# Patient Record
Sex: Female | Born: 1940
Health system: Southern US, Community
[De-identification: ages and names within clinical notes are randomized; demographics above are authoritative.]

## PROBLEM LIST (undated history)

## (undated) DIAGNOSIS — I10 Essential (primary) hypertension: Secondary | ICD-10-CM

## (undated) DIAGNOSIS — Z4689 Encounter for fitting and adjustment of other specified devices: Secondary | ICD-10-CM

## (undated) DIAGNOSIS — C801 Malignant (primary) neoplasm, unspecified: Secondary | ICD-10-CM

## (undated) DIAGNOSIS — N816 Rectocele: Secondary | ICD-10-CM

## (undated) DIAGNOSIS — Z5189 Encounter for other specified aftercare: Secondary | ICD-10-CM

## (undated) DIAGNOSIS — E785 Hyperlipidemia, unspecified: Secondary | ICD-10-CM

## (undated) DIAGNOSIS — M1990 Unspecified osteoarthritis, unspecified site: Secondary | ICD-10-CM

## (undated) DIAGNOSIS — E039 Hypothyroidism, unspecified: Secondary | ICD-10-CM

## (undated) DIAGNOSIS — E669 Obesity, unspecified: Secondary | ICD-10-CM

## (undated) DIAGNOSIS — N951 Menopausal and female climacteric states: Secondary | ICD-10-CM

## (undated) DIAGNOSIS — N8110 Cystocele, unspecified: Secondary | ICD-10-CM

## (undated) HISTORY — PX: ABDOMINAL HYSTERECTOMY: SHX81

## (undated) HISTORY — DX: Encounter for other specified aftercare: Z51.89

## (undated) HISTORY — PX: COLON SURGERY: SHX602

## (undated) HISTORY — DX: Malignant (primary) neoplasm, unspecified: C80.1

## (undated) HISTORY — DX: Encounter for fitting and adjustment of other specified devices: Z46.89

## (undated) HISTORY — PX: BREAST SURGERY: SHX581

## (undated) HISTORY — PX: COLONOSCOPY: SHX174

## (undated) HISTORY — PX: TUBAL LIGATION: SHX77

## (undated) HISTORY — PX: OTHER SURGICAL HISTORY: SHX169

---

## 1970-07-23 HISTORY — PX: BREAST EXCISIONAL BIOPSY: SUR124

## 2004-06-05 ENCOUNTER — Ambulatory Visit: Payer: Self-pay | Admitting: Unknown Physician Specialty

## 2004-08-29 ENCOUNTER — Ambulatory Visit: Payer: Self-pay | Admitting: Obstetrics and Gynecology

## 2005-09-30 ENCOUNTER — Emergency Department (HOSPITAL_COMMUNITY): Admission: EM | Admit: 2005-09-30 | Discharge: 2005-09-30 | Payer: Self-pay | Admitting: Emergency Medicine

## 2005-12-28 ENCOUNTER — Ambulatory Visit: Payer: Self-pay

## 2007-01-29 ENCOUNTER — Ambulatory Visit: Payer: Self-pay | Admitting: Certified Nurse Midwife

## 2007-04-05 ENCOUNTER — Observation Stay: Payer: Self-pay | Admitting: Internal Medicine

## 2008-08-05 ENCOUNTER — Ambulatory Visit: Payer: Self-pay

## 2009-01-27 ENCOUNTER — Ambulatory Visit: Payer: Self-pay | Admitting: Family Medicine

## 2009-08-23 ENCOUNTER — Ambulatory Visit: Payer: Self-pay | Admitting: Family Medicine

## 2009-09-29 ENCOUNTER — Ambulatory Visit: Payer: Self-pay

## 2010-10-11 ENCOUNTER — Ambulatory Visit: Payer: Self-pay

## 2011-11-08 ENCOUNTER — Ambulatory Visit: Payer: Self-pay

## 2012-01-10 ENCOUNTER — Ambulatory Visit: Payer: Self-pay | Admitting: Unknown Physician Specialty

## 2012-01-10 LAB — HM COLONOSCOPY

## 2012-01-11 LAB — PATHOLOGY REPORT

## 2012-11-10 ENCOUNTER — Ambulatory Visit: Payer: Self-pay

## 2013-04-02 ENCOUNTER — Ambulatory Visit: Payer: Self-pay | Admitting: Family Medicine

## 2013-10-20 DIAGNOSIS — I10 Essential (primary) hypertension: Secondary | ICD-10-CM | POA: Insufficient documentation

## 2013-10-20 DIAGNOSIS — N814 Uterovaginal prolapse, unspecified: Secondary | ICD-10-CM | POA: Insufficient documentation

## 2013-10-20 DIAGNOSIS — N816 Rectocele: Secondary | ICD-10-CM | POA: Insufficient documentation

## 2013-10-20 DIAGNOSIS — E7800 Pure hypercholesterolemia, unspecified: Secondary | ICD-10-CM | POA: Insufficient documentation

## 2013-10-20 DIAGNOSIS — E669 Obesity, unspecified: Secondary | ICD-10-CM | POA: Insufficient documentation

## 2013-10-20 DIAGNOSIS — N951 Menopausal and female climacteric states: Secondary | ICD-10-CM | POA: Insufficient documentation

## 2013-10-20 DIAGNOSIS — M1990 Unspecified osteoarthritis, unspecified site: Secondary | ICD-10-CM | POA: Insufficient documentation

## 2013-11-11 ENCOUNTER — Ambulatory Visit: Payer: Self-pay

## 2014-08-03 ENCOUNTER — Encounter: Payer: Self-pay | Admitting: Internal Medicine

## 2014-08-08 NOTE — Progress Notes (Signed)
This encounter was created in error - please disregard.

## 2014-09-10 DIAGNOSIS — M7742 Metatarsalgia, left foot: Secondary | ICD-10-CM | POA: Diagnosis not present

## 2014-09-10 DIAGNOSIS — M216X9 Other acquired deformities of unspecified foot: Secondary | ICD-10-CM | POA: Diagnosis not present

## 2014-09-10 DIAGNOSIS — Q829 Congenital malformation of skin, unspecified: Secondary | ICD-10-CM | POA: Diagnosis not present

## 2014-09-10 DIAGNOSIS — M79672 Pain in left foot: Secondary | ICD-10-CM | POA: Diagnosis not present

## 2014-10-18 DIAGNOSIS — L237 Allergic contact dermatitis due to plants, except food: Secondary | ICD-10-CM | POA: Diagnosis not present

## 2015-01-28 DIAGNOSIS — B372 Candidiasis of skin and nail: Secondary | ICD-10-CM | POA: Insufficient documentation

## 2015-01-28 DIAGNOSIS — E039 Hypothyroidism, unspecified: Secondary | ICD-10-CM | POA: Insufficient documentation

## 2015-01-28 DIAGNOSIS — L255 Unspecified contact dermatitis due to plants, except food: Secondary | ICD-10-CM | POA: Insufficient documentation

## 2015-01-28 DIAGNOSIS — E7800 Pure hypercholesterolemia, unspecified: Secondary | ICD-10-CM

## 2015-01-28 DIAGNOSIS — M545 Low back pain, unspecified: Secondary | ICD-10-CM | POA: Insufficient documentation

## 2015-01-28 DIAGNOSIS — L0390 Cellulitis, unspecified: Secondary | ICD-10-CM | POA: Insufficient documentation

## 2015-01-28 DIAGNOSIS — I10 Essential (primary) hypertension: Secondary | ICD-10-CM | POA: Insufficient documentation

## 2015-02-02 DIAGNOSIS — E785 Hyperlipidemia, unspecified: Secondary | ICD-10-CM | POA: Insufficient documentation

## 2015-02-02 DIAGNOSIS — O039 Complete or unspecified spontaneous abortion without complication: Secondary | ICD-10-CM | POA: Insufficient documentation

## 2015-02-02 DIAGNOSIS — L03119 Cellulitis of unspecified part of limb: Secondary | ICD-10-CM | POA: Insufficient documentation

## 2015-02-04 ENCOUNTER — Ambulatory Visit (INDEPENDENT_AMBULATORY_CARE_PROVIDER_SITE_OTHER): Payer: Medicare Other | Admitting: Family Medicine

## 2015-02-04 ENCOUNTER — Encounter: Payer: Self-pay | Admitting: Family Medicine

## 2015-02-04 VITALS — BP 136/94 | HR 63 | Temp 97.4°F | Resp 16 | Wt 186.4 lb

## 2015-02-04 DIAGNOSIS — E785 Hyperlipidemia, unspecified: Secondary | ICD-10-CM

## 2015-02-04 DIAGNOSIS — M1990 Unspecified osteoarthritis, unspecified site: Secondary | ICD-10-CM | POA: Diagnosis not present

## 2015-02-04 DIAGNOSIS — I10 Essential (primary) hypertension: Secondary | ICD-10-CM

## 2015-02-04 NOTE — Progress Notes (Signed)
Subjective:    Patient ID: Stacey Fitzgerald, female    DOB: 1941-07-04, 74 y.o.   MRN: [id number] Chief Complaint  Patient presents with  . Follow-up    HPI  This 74 year old female presents for follow up of hypertension, hypothyroidism, hypercholesterolemia and arthritis. Borderline BP today but feels it is due to recent stress. Tolerating Accupril without side effects. Trying to follow low fat diet and continues Pravastatin 40 mg qd without myalgias. Arthritis in lumbar spine stable and only has intermittent ache with stiffness. Finds Tylenol Arthritis works to control ache/pain. No radiation of pain or numbness. 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 Good compliance with Levothyroxine 50 mcg qd. Denies palpitations, chest pain, tremor, heat or cold intolerance.  History reviewed. No pertinent past medical history. Patient Active Problem List   Diagnosis Date Noted  . Abortion, spontaneous 02/02/2015  . Bacterial skin infection of leg 02/02/2015  . HLD (hyperlipidemia) 02/02/2015  . Pure hypercholesterolemia 01/28/2015  . Essential hypertension 01/28/2015  . Cellulitis 01/28/2015  . Hypothyroidism 01/28/2015  . Monilial intertrigo 01/28/2015  . Rhus dermatitis 01/28/2015  . Low back pain, episodic 01/28/2015  . Hernia, rectovaginal 10/20/2013  . Adiposity 10/20/2013  . Climacteric 10/20/2013  . BP (high blood pressure) 10/20/2013  . Hypercholesteremia 10/20/2013  . Bladder cystocele 10/20/2013  . Arthritis 10/20/2013   Past Surgical History  Procedure Laterality Date  . Tubal ligation    . Breast biopsy Left    History  Substance Use Topics  . Smoking status: Never Smoker   . Smokeless tobacco: Not on file  . Alcohol Use: No   Family History  Problem Relation Age of Onset  . Diabetes Mother   . Seizures Mother   . CVA Father   . Dementia Brother   . Diabetes Maternal Grandfather   . Mental illness Sister    Current Outpatient Prescriptions on File Prior to  Visit  Medication Sig Dispense Refill  . levothyroxine (SYNTHROID, LEVOTHROID) 50 MCG tablet Take 50 mcg by mouth daily.    . Multiple Vitamins-Minerals (CENTRUM SILVER ADULT 50+) TABS Take 1 tablet by mouth daily.    . niacin 250 MG tablet Take 250 mg by mouth at bedtime.    . pravastatin (PRAVACHOL) 40 MG tablet Take 40 mg by mouth at bedtime.    . quinapril (ACCUPRIL) 10 MG tablet Take 10 mg by mouth daily.     No current facility-administered medications on file prior to visit.   Allergies  Allergen Reactions  . Keflex [Cephalexin] Hives   Review of Systems  Constitutional: Negative.   HENT: Negative.   Eyes: Negative.   Respiratory: Negative.   Cardiovascular: Negative.   Gastrointestinal: Negative.   Musculoskeletal: Positive for back pain.       Controlled by Arthritis Tylenol.      BP 136/94 mmHg  Pulse 63  Temp(Src) 97.4 F (36.3 C) (Oral)  Resp 16  Wt 186 lb 6.4 oz (84.55 kg)  SpO2 97%  Objective:   Physical Exam  Constitutional: She appears well-developed and well-nourished.  HENT:  Head: Normocephalic and atraumatic.  Right Ear: External ear normal.  Left Ear: External ear normal.  Nose: Nose normal.  Mouth/Throat: Oropharynx is clear and moist.  Eyes: Conjunctivae and EOM are normal. Pupils are equal, round, and reactive to light.  Neck: Normal range of motion. Neck supple.  Cardiovascular: Normal rate, regular rhythm, normal heart sounds and intact distal pulses.   Many large clusters  of superficial varicose veins of both legs.  Pulmonary/Chest: Effort normal.  Abdominal: Bowel sounds are normal.  Musculoskeletal: She exhibits no edema or tenderness.  Lower back ache - suspect arthritis. No pain radiation.  Skin: Skin is warm and dry.  Psychiatric: She has a normal mood and affect. Her behavior is normal. Thought content normal.      Assessment & Plan:  1. Essential hypertension Slightly elevated today. Feels it is due to recent stress. Tolerating  Accupril 40 mg qd without side effects. Will check routine labs and follow up pending reports. - CBC with Differential/Platelet - Comprehensive metabolic panel - Lipid panel - TSH  2. Arthritis History of degenerative changes with scoliosis and Grade I anterolisthesis of L4 on L5 on lumbar spine x-ray in 2014. Feels Tylenol controls the ache/pain and has declined referral or MRI. - CBC with Differential/Platelet  3. HLD (hyperlipidemia) Tolerating Pravastatin 40 mg qd and trying to follow low fat diet. Continue medication and ordered follow up labs. Recommend annual wellness exam in the next 3-4 months. Recheck pending lab reports. - Comprehensive metabolic panel - Lipid panel - TSH

## 2015-02-12 LAB — LIPID PANEL
Chol/HDL Ratio: 4.7 ratio units — ABNORMAL HIGH (ref 0.0–4.4)
Cholesterol, Total: 188 mg/dL (ref 100–199)
HDL: 40 mg/dL (ref >39)
LDL CALC: 115 mg/dL — AB (ref 0–99)
Triglycerides: 163 mg/dL — ABNORMAL HIGH (ref 0–149)
VLDL Cholesterol Cal: 33 mg/dL (ref 5–40)

## 2015-02-12 LAB — CBC WITH DIFFERENTIAL/PLATELET
Basophils Absolute: 0.1 10*3/uL (ref 0.0–0.2)
Basos: 1 %
EOS (ABSOLUTE): 0.3 10*3/uL (ref 0.0–0.4)
EOS: 3 %
Hematocrit: 41.4 % (ref 34.0–46.6)
Hemoglobin: 13.6 g/dL (ref 11.1–15.9)
IMMATURE GRANULOCYTES: 0 %
Immature Grans (Abs): 0 10*3/uL (ref 0.0–0.1)
Lymphocytes Absolute: 1.8 10*3/uL (ref 0.7–3.1)
Lymphs: 23 %
MCH: 27.8 pg (ref 26.6–33.0)
MCHC: 32.9 g/dL (ref 31.5–35.7)
MCV: 85 fL (ref 79–97)
MONOCYTES: 7 %
Monocytes Absolute: 0.5 10*3/uL (ref 0.1–0.9)
Neutrophils Absolute: 5.2 10*3/uL (ref 1.4–7.0)
Neutrophils: 66 %
PLATELETS: 287 10*3/uL (ref 150–379)
RBC: 4.89 x10E6/uL (ref 3.77–5.28)
RDW: 13.8 % (ref 12.3–15.4)
WBC: 7.9 10*3/uL (ref 3.4–10.8)

## 2015-02-12 LAB — TSH: TSH: 2.1 u[IU]/mL (ref 0.450–4.500)

## 2015-02-12 LAB — COMPREHENSIVE METABOLIC PANEL
A/G RATIO: 1.4 (ref 1.1–2.5)
ALT: 12 IU/L (ref 0–32)
AST: 6 IU/L (ref 0–40)
Albumin: 4 g/dL (ref 3.5–4.8)
Alkaline Phosphatase: 70 IU/L (ref 39–117)
BUN/Creatinine Ratio: 22 (ref 11–26)
BUN: 18 mg/dL (ref 8–27)
Bilirubin Total: 0.6 mg/dL (ref 0.0–1.2)
CALCIUM: 9.4 mg/dL (ref 8.7–10.3)
CO2: 24 mmol/L (ref 18–29)
Chloride: 102 mmol/L (ref 97–108)
Creatinine, Ser: 0.82 mg/dL (ref 0.57–1.00)
GFR calc Af Amer: 82 mL/min/{1.73_m2} (ref >59)
GFR calc non Af Amer: 71 mL/min/{1.73_m2} (ref >59)
GLUCOSE: 100 mg/dL — AB (ref 65–99)
Globulin, Total: 2.8 g/dL (ref 1.5–4.5)
POTASSIUM: 4.4 mmol/L (ref 3.5–5.2)
Sodium: 142 mmol/L (ref 134–144)
Total Protein: 6.8 g/dL (ref 6.0–8.5)

## 2015-02-15 ENCOUNTER — Telehealth: Payer: Self-pay

## 2015-02-15 NOTE — Telephone Encounter (Signed)
-----   Message from [place], [place] sent at 02/14/2015  2:02 AM EDT ----- Blood tests normal except Triglycerides elevated (better than December 2015 test). Continue pravastatin and recheck in 6 months.

## 2015-02-15 NOTE — Telephone Encounter (Signed)
Patient advised as directed below. Patient came by the office.

## 2015-02-15 NOTE — Telephone Encounter (Signed)
LMTCB

## 2015-05-16 ENCOUNTER — Telehealth: Payer: Self-pay | Admitting: Family Medicine

## 2015-05-16 NOTE — Telephone Encounter (Signed)
Per Simona Huh pt will need an appt. Patient notified. Patient stated that she will continue to drink cranberry juice and water. Patient said that if not better she will come in for ov.

## 2015-05-16 NOTE — Telephone Encounter (Signed)
Pt stated that she has been having a little bit of burning sensation when voiding, she stated that she has been drinking cranberry juice and water and wants to know if she can only bring a urine sample and if you can send her a prescription if she needs it. She stated that she cannot come in for an Office Visit until Friday. Her phone # [phone]  Please advise,

## 2015-05-16 NOTE — Telephone Encounter (Signed)
Left message to call back.  Can she bring a urine sample if she has symptoms, or does she needs an Office Visit?  Please advise,

## 2015-05-16 NOTE — Telephone Encounter (Signed)
Pt wants to know if she can drop off a urine sample tomorrow am.  She thinks she may have a UTI.    Her call back is [phone].  If she does answer please leave a message.   ThanksTeri

## 2015-05-19 DIAGNOSIS — N390 Urinary tract infection, site not specified: Secondary | ICD-10-CM | POA: Diagnosis not present

## 2015-05-19 DIAGNOSIS — R300 Dysuria: Secondary | ICD-10-CM | POA: Diagnosis not present

## 2015-06-07 ENCOUNTER — Other Ambulatory Visit: Payer: Self-pay | Admitting: Family Medicine

## 2015-06-20 ENCOUNTER — Telehealth: Payer: Self-pay | Admitting: Family Medicine

## 2015-07-01 ENCOUNTER — Encounter: Payer: Medicare Other | Admitting: Family Medicine

## 2015-07-28 ENCOUNTER — Ambulatory Visit (INDEPENDENT_AMBULATORY_CARE_PROVIDER_SITE_OTHER): Payer: Medicare Other | Admitting: Family Medicine

## 2015-07-28 ENCOUNTER — Encounter: Payer: Self-pay | Admitting: Family Medicine

## 2015-07-28 VITALS — BP 180/102 | HR 74 | Temp 97.9°F | Resp 14 | Wt 186.4 lb

## 2015-07-28 DIAGNOSIS — E039 Hypothyroidism, unspecified: Secondary | ICD-10-CM | POA: Diagnosis not present

## 2015-07-28 DIAGNOSIS — E7800 Pure hypercholesterolemia, unspecified: Secondary | ICD-10-CM | POA: Diagnosis not present

## 2015-07-28 DIAGNOSIS — I10 Essential (primary) hypertension: Secondary | ICD-10-CM | POA: Diagnosis not present

## 2015-07-28 MED ORDER — LEVOTHYROXINE SODIUM 50 MCG PO TABS
50.0000 ug | ORAL_TABLET | Freq: Every day | ORAL | Status: DC
Start: 2015-07-28 — End: 2016-08-05

## 2015-07-28 MED ORDER — PRAVASTATIN SODIUM 40 MG PO TABS: 40.0000 mg | ORAL_TABLET | Freq: Every day | ORAL | Status: DC

## 2015-07-28 MED ORDER — QUINAPRIL HCL 10 MG PO TABS: 10.0000 mg | ORAL_TABLET | Freq: Every day | ORAL | Status: DC

## 2015-07-28 NOTE — Progress Notes (Signed)
Patient ID: Stacey Fitzgerald, female   DOB: [birth year], 75 y.o.   MRN: [id number]   Patient: Stacey Fitzgerald Female    DOB: May 22, 1941   75 y.o.   MRN: [id number] Visit Date: 07/28/2015  Today's Provider: Vernie Murders, PA   Chief Complaint  Patient presents with  . Hyperlipidemia  . Hypertension  . Hypothyroidism  . Medication Refill   Subjective:    Hyperlipidemia This is a chronic problem. The problem is controlled.  Hypertension This is a chronic problem.   Patient Active Problem List   Diagnosis Date Noted  . Abortion, spontaneous 02/02/2015  . Bacterial skin infection of leg 02/02/2015  . HLD (hyperlipidemia) 02/02/2015  . Pure hypercholesterolemia 01/28/2015  . Essential hypertension 01/28/2015  . Cellulitis 01/28/2015  . Hypothyroidism 01/28/2015  . Monilial intertrigo 01/28/2015  . Rhus dermatitis 01/28/2015  . Low back pain, episodic 01/28/2015  . Hernia, rectovaginal 10/20/2013  . Adiposity 10/20/2013  . Climacteric 10/20/2013  . BP (high blood pressure) 10/20/2013  . Hypercholesteremia 10/20/2013  . Bladder cystocele 10/20/2013  . Arthritis 10/20/2013   Past Surgical History  Procedure Laterality Date  . Tubal ligation    . Breast biopsy Left    Family History  Problem Relation Age of Onset  . Diabetes Mother   . Seizures Mother   . CVA Father   . Dementia Brother   . Diabetes Maternal Grandfather   . Mental illness Sister    Allergies  Allergen Reactions  . Keflex [Cephalexin] Hives     Previous Medications   LEVOTHYROXINE (SYNTHROID, LEVOTHROID) 50 MCG TABLET    Take 50 mcg by mouth daily.   MULTIPLE VITAMINS-MINERALS (CENTRUM SILVER ADULT 50+) TABS    Take 1 tablet by mouth daily.   NIACIN 250 MG TABLET    Take 250 mg by mouth at bedtime.   PRAVASTATIN (PRAVACHOL) 40 MG TABLET    TAKE 1 TABLET AT BEDTIME   QUINAPRIL (ACCUPRIL) 10 MG TABLET    Take 10 mg by mouth daily.    Review of Systems  Constitutional: Negative.   HENT:  Negative.   Eyes: Negative.   Respiratory: Negative.   Cardiovascular: Negative.   Gastrointestinal: Negative.   Endocrine: Negative.   Genitourinary: Negative.   Musculoskeletal: Negative.   Skin: Negative.   Allergic/Immunologic: Negative.   Neurological: Negative.   Hematological: Negative.   Psychiatric/Behavioral: Negative.     Social History  Substance Use Topics  . Smoking status: Never Smoker   . Smokeless tobacco: Not on file  . Alcohol Use: No   Objective:   BP 180/102 mmHg  Pulse 74  Temp(Src) 97.9 F (36.6 C) (Oral)  Resp 14  Wt 186 lb 6.4 oz (84.55 kg)  Physical Exam  Constitutional: She is oriented to person, place, and time. She appears well-developed and well-nourished.  HENT:  Head: Normocephalic.  Eyes: Conjunctivae and EOM are normal.  Neck: Normal range of motion. Neck supple. No JVD present. No thyromegaly present.  Cardiovascular: Normal rate, regular rhythm, normal heart sounds and intact distal pulses.   Pulmonary/Chest: Effort normal and breath sounds normal.  Abdominal: Soft. Bowel sounds are normal.  Lymphadenopathy:    She has no cervical adenopathy.  Neurological: She is alert and oriented to person, place, and time.  Psychiatric: Her speech is normal. Judgment and thought content normal. Her mood appears anxious. She is agitated. Cognition and memory are normal. She expresses no suicidal ideation. She expresses no suicidal plans.  Anxious and crying  during interview while discussing husband's health and plans for hip replacement on 08-02-15.      Assessment & Plan:     1. Essential hypertension BP very high today. Finally admits to being off all her medications recently due to anxiety/worry about husband's health. Feels stress will subside once he has his surgery on 08-02-15. Will get follow up labs and refill medications. Recheck BP at home and call report in 2-3 weeks.  - quinapril (ACCUPRIL) 10 MG tablet; Take 1 tablet (10 mg total) by  mouth daily.  Dispense: 90 tablet; Refill: 3 - CBC with Differential/Platelet; Future  2. Hypothyroidism, unspecified hypothyroidism type Tolerating Levothyroxine without side effects. Refill and get follow up labs. - levothyroxine (SYNTHROID, LEVOTHROID) 50 MCG tablet; Take 1 tablet (50 mcg total) by mouth daily.  Dispense: 90 tablet; Refill: 3 - TSH; Future  3. Hypercholesteremia Poor compliance with medications recently due to worry about husband having hip replacement soon. Will refill medications and get follow up labs. Recheck in 3 months routinely. - pravastatin (PRAVACHOL) 40 MG tablet; Take 1 tablet (40 mg total) by mouth at bedtime.  Dispense: 90 tablet; Refill: 3 - COMPLETE METABOLIC PANEL WITH GFR; Future - Lipid panel; Future

## 2015-08-15 ENCOUNTER — Telehealth: Payer: Self-pay | Admitting: Family Medicine

## 2015-08-15 DIAGNOSIS — I10 Essential (primary) hypertension: Secondary | ICD-10-CM

## 2015-08-15 DIAGNOSIS — E039 Hypothyroidism, unspecified: Secondary | ICD-10-CM

## 2015-08-15 DIAGNOSIS — E7800 Pure hypercholesterolemia, unspecified: Secondary | ICD-10-CM

## 2015-08-15 NOTE — Telephone Encounter (Signed)
Pt stated she needs her labs slip from 07/28/15 put up front and she will come by this week to pick it up. Thanks TNP

## 2015-08-15 NOTE — Telephone Encounter (Signed)
Lab slip printed at front desk for pick up.

## 2015-08-18 ENCOUNTER — Other Ambulatory Visit: Payer: Self-pay | Admitting: Family Medicine

## 2015-08-18 DIAGNOSIS — I10 Essential (primary) hypertension: Secondary | ICD-10-CM | POA: Diagnosis not present

## 2015-08-18 DIAGNOSIS — E039 Hypothyroidism, unspecified: Secondary | ICD-10-CM | POA: Diagnosis not present

## 2015-08-18 DIAGNOSIS — E7800 Pure hypercholesterolemia, unspecified: Secondary | ICD-10-CM | POA: Diagnosis not present

## 2015-08-19 LAB — COMPREHENSIVE METABOLIC PANEL
ALT: 13 IU/L (ref 0–32)
AST: 13 IU/L (ref 0–40)
Albumin/Globulin Ratio: 1.8 (ref 1.1–2.5)
Albumin: 4.2 g/dL (ref 3.5–4.8)
Alkaline Phosphatase: 70 IU/L (ref 39–117)
BILIRUBIN TOTAL: 0.6 mg/dL (ref 0.0–1.2)
BUN/Creatinine Ratio: 17 (ref 11–26)
BUN: 13 mg/dL (ref 8–27)
CHLORIDE: 103 mmol/L (ref 96–106)
CO2: 24 mmol/L (ref 18–29)
Calcium: 9.9 mg/dL (ref 8.7–10.3)
Creatinine, Ser: 0.77 mg/dL (ref 0.57–1.00)
GFR calc non Af Amer: 77 mL/min/{1.73_m2} (ref >59)
GFR, EST AFRICAN AMERICAN: 89 mL/min/{1.73_m2} (ref >59)
GLUCOSE: 97 mg/dL (ref 65–99)
Globulin, Total: 2.4 g/dL (ref 1.5–4.5)
Potassium: 4.2 mmol/L (ref 3.5–5.2)
Sodium: 142 mmol/L (ref 134–144)
TOTAL PROTEIN: 6.6 g/dL (ref 6.0–8.5)

## 2015-08-19 LAB — CBC WITH DIFFERENTIAL/PLATELET
BASOS ABS: 0.1 10*3/uL (ref 0.0–0.2)
Basos: 1 %
EOS (ABSOLUTE): 0.4 10*3/uL (ref 0.0–0.4)
Eos: 4 %
HEMATOCRIT: 41.7 % (ref 34.0–46.6)
Hemoglobin: 14.1 g/dL (ref 11.1–15.9)
Immature Grans (Abs): 0 10*3/uL (ref 0.0–0.1)
Immature Granulocytes: 0 %
LYMPHS ABS: 2.1 10*3/uL (ref 0.7–3.1)
Lymphs: 24 %
MCH: 28 pg (ref 26.6–33.0)
MCHC: 33.8 g/dL (ref 31.5–35.7)
MCV: 83 fL (ref 79–97)
MONOS ABS: 0.5 10*3/uL (ref 0.1–0.9)
Monocytes: 6 %
NEUTROS PCT: 65 %
Neutrophils Absolute: 5.5 10*3/uL (ref 1.4–7.0)
PLATELETS: 281 10*3/uL (ref 150–379)
RBC: 5.04 x10E6/uL (ref 3.77–5.28)
RDW: 13.8 % (ref 12.3–15.4)
WBC: 8.5 10*3/uL (ref 3.4–10.8)

## 2015-08-19 LAB — LIPID PANEL
CHOL/HDL RATIO: 4.3 ratio (ref 0.0–4.4)
Cholesterol, Total: 187 mg/dL (ref 100–199)
HDL: 44 mg/dL (ref >39)
LDL Calculated: 108 mg/dL — ABNORMAL HIGH (ref 0–99)
Triglycerides: 173 mg/dL — ABNORMAL HIGH (ref 0–149)
VLDL CHOLESTEROL CAL: 35 mg/dL (ref 5–40)

## 2015-08-19 LAB — TSH: TSH: 2.84 u[IU]/mL (ref 0.450–4.500)

## 2015-08-22 ENCOUNTER — Telehealth: Payer: Self-pay

## 2015-08-22 NOTE — Telephone Encounter (Signed)
LMTCB

## 2015-08-22 NOTE — Telephone Encounter (Signed)
-----   Message from [place]o [place], [place] sent at 08/19/2015  6:13 PM EST ----- All blood tests normal except triglycerides slightly elevated. Cholesterol better. Continue Pravastatin and low fat diet. Recheck progress in 6 months.

## 2015-08-23 NOTE — Telephone Encounter (Signed)
Patient advised as directed below. Patient verbalized understanding and agrees with plan of care.  

## 2015-09-21 ENCOUNTER — Ambulatory Visit (INDEPENDENT_AMBULATORY_CARE_PROVIDER_SITE_OTHER): Payer: Medicare Other | Admitting: Family Medicine

## 2015-09-21 ENCOUNTER — Encounter: Payer: Self-pay | Admitting: Family Medicine

## 2015-09-21 VITALS — BP 130/78 | HR 60 | Temp 97.9°F | Resp 16 | Wt 184.6 lb

## 2015-09-21 DIAGNOSIS — J069 Acute upper respiratory infection, unspecified: Secondary | ICD-10-CM | POA: Diagnosis not present

## 2015-09-21 NOTE — Progress Notes (Signed)
Subjective:     Patient ID: Stacey Fitzgerald, female   DOB: 1941-04-10, 75 y.o.   MRN: [id number]  HPI  Chief Complaint  Patient presents with  . Sore Throat    Patient comes in office today with concerns of sore throat for the past 3 days, patient reports pain on the right side of her neck as well. Patient denies any other associated cold/flu like symptoms. Patient reports taking otc Coricidan Hbp and Ibuprofen.   Reports mild sinus congestion, PND and occasional cough. Has been using home alcohol remedies to suppress cough at night. States he husband was ill at first.   Review of Systems     Objective:   Physical Exam  Constitutional: She appears well-developed and well-nourished. No distress.  Ears: T.M's intact without inflammation Sinuses: non-tender Throat: no tonsillar enlargement or exudate Neck: Left tender anterior cervical node. Lungs: clear     Assessment:     1. Upper respiratory infection     Plan:    Discussed continued otc medications for symptomatic treatment.

## 2015-09-21 NOTE — Patient Instructions (Signed)
Continue Coricidin, may add salt water nasal spray for congestion, Delsym for cough as needed, and Mucinex or Robitussin expectorant.

## 2015-09-27 DIAGNOSIS — R0981 Nasal congestion: Secondary | ICD-10-CM | POA: Diagnosis not present

## 2015-09-27 DIAGNOSIS — J069 Acute upper respiratory infection, unspecified: Secondary | ICD-10-CM | POA: Diagnosis not present

## 2015-11-15 ENCOUNTER — Ambulatory Visit (INDEPENDENT_AMBULATORY_CARE_PROVIDER_SITE_OTHER): Payer: Medicare Other | Admitting: Family Medicine

## 2015-11-15 ENCOUNTER — Encounter: Payer: Self-pay | Admitting: Family Medicine

## 2015-11-15 ENCOUNTER — Telehealth: Payer: Self-pay | Admitting: Family Medicine

## 2015-11-15 VITALS — BP 160/98 | HR 57 | Temp 97.6°F | Resp 14 | Wt 182.2 lb

## 2015-11-15 DIAGNOSIS — R42 Dizziness and giddiness: Secondary | ICD-10-CM | POA: Diagnosis not present

## 2015-11-15 DIAGNOSIS — R7301 Impaired fasting glucose: Secondary | ICD-10-CM | POA: Diagnosis not present

## 2015-11-15 DIAGNOSIS — R110 Nausea: Secondary | ICD-10-CM

## 2015-11-15 MED ORDER — MECLIZINE HCL 32 MG PO TABS: 32.0000 mg | ORAL_TABLET | Freq: Three times a day (TID) | ORAL | Status: DC | PRN

## 2015-11-15 NOTE — Patient Instructions (Signed)

## 2015-11-15 NOTE — Progress Notes (Signed)
Patient ID: Stacey Fitzgerald, female   DOB: 1941-04-04, 75 y.o.   MRN: [id number]   Patient: Stacey Fitzgerald Female    DOB: March 27, 1941   75 y.o.   MRN: [id number] Visit Date: 11/15/2015  Today's Provider: Vernie Murders, PA   Chief Complaint  Patient presents with  . Dizziness  . Nausea   Subjective:    Dizziness This is a new problem. The current episode started today. The problem occurs constantly. Associated symptoms include nausea and vertigo. Pertinent negatives include no congestion, coughing, fever, sore throat or vomiting. Associated symptoms comments: Elevated blood pressure and slight left ear ache over the past couple days.. Nothing aggravates the symptoms. She has tried nothing for the symptoms.    Patient Active Problem List   Diagnosis Date Noted  . Abortion, spontaneous 02/02/2015  . Bacterial skin infection of leg 02/02/2015  . HLD (hyperlipidemia) 02/02/2015  . Pure hypercholesterolemia 01/28/2015  . Essential hypertension 01/28/2015  . Cellulitis 01/28/2015  . Hypothyroidism 01/28/2015  . Monilial intertrigo 01/28/2015  . Rhus dermatitis 01/28/2015  . Low back pain, episodic 01/28/2015  . Hernia, rectovaginal 10/20/2013  . Adiposity 10/20/2013  . Climacteric 10/20/2013  . BP (high blood pressure) 10/20/2013  . Hypercholesteremia 10/20/2013  . Bladder cystocele 10/20/2013  . Arthritis 10/20/2013   Past Surgical History  Procedure Laterality Date  . Tubal ligation    . Breast biopsy Left    Family History  Problem Relation Age of Onset  . Diabetes Mother   . Seizures Mother   . CVA Father   . Dementia Brother   . Diabetes Maternal Grandfather   . Mental illness Sister    Previous Medications   LEVOTHYROXINE (SYNTHROID, LEVOTHROID) 50 MCG TABLET    Take 1 tablet (50 mcg total) by mouth daily.   MULTIPLE VITAMINS-MINERALS (CENTRUM SILVER ADULT 50+) TABS    Take 1 tablet by mouth daily.   NIACIN 250 MG TABLET    Take 250 mg by mouth at bedtime.   PRAVASTATIN (PRAVACHOL) 40 MG TABLET    Take 1 tablet (40 mg total) by mouth at bedtime.   QUINAPRIL (ACCUPRIL) 10 MG TABLET    Take 1 tablet (10 mg total) by mouth daily.   Allergies  Allergen Reactions  . Keflex [Cephalexin] Hives   Review of Systems  Constitutional: Negative.  Negative for fever.  HENT: Negative.  Negative for congestion and sore throat.   Eyes: Negative.   Respiratory: Negative.  Negative for cough.   Cardiovascular: Negative.   Gastrointestinal: Positive for nausea. Negative for vomiting.  Endocrine: Negative.   Genitourinary: Negative.   Musculoskeletal: Negative.   Skin: Negative.   Allergic/Immunologic: Negative.   Neurological: Positive for dizziness and vertigo.  Hematological: Negative.   Psychiatric/Behavioral: Negative.     Social History  Substance Use Topics  . Smoking status: Never Smoker   . Smokeless tobacco: Not on file  . Alcohol Use: No   Objective:   BP 160/98 mmHg  Pulse 57  Temp(Src) 97.6 F (36.4 C) (Oral)  Resp 14  Wt 182 lb 3.2 oz (82.645 kg)  SpO2 97% BP Readings from Last 3 Encounters:  11/15/15 160/98  09/21/15 130/78  07/28/15 180/102    Physical Exam  Constitutional: She is oriented to person, place, and time. She appears well-developed and well-nourished.  HENT:  Head: Normocephalic.  Right Ear: External ear normal.  Canal occluded with wax.  Eyes: Conjunctivae and EOM are normal.  Neck: Normal range of motion. Neck  supple.  Cardiovascular: Normal rate and regular rhythm.   Pulmonary/Chest: Effort normal and breath sounds normal.  Abdominal: Soft. Bowel sounds are normal.  Lymphadenopathy:    She has no cervical adenopathy.  Neurological: She is alert and oriented to person, place, and time. She has normal reflexes. Coordination abnormal.  Slightly positive Romberg after bending over.      Assessment & Plan:     1. Dizziness Onset over the past 12 hours with a history of some left ear discomfort. Ear  canal occluded with wax but no redness to canal and unable to see TM. Slightly off balance when she stands quickly or bends over. Will treat with Meclizine and get labs. Increase fluids and rest at home. Recheck pending reports. - meclizine (ANTIVERT) 32 MG tablet; Take 1 tablet (32 mg total) by mouth 3 (three) times daily as needed.  Dispense: 30 tablet; Refill: 0 - CBC with Differential/Platelet - Comprehensive metabolic panel  2. Nausea No vomiting or diarrhea. Ate breakfast today without any troubles. Nausea associated with dizziness. Suspect viral labyrinthitis. Recheck pending lab reports. - meclizine (ANTIVERT) 32 MG tablet; Take 1 tablet (32 mg total) by mouth 3 (three) times daily as needed.  Dispense: 30 tablet; Refill: 0 - CBC with Differential/Platelet - Comprehensive metabolic panel

## 2015-11-15 NOTE — Telephone Encounter (Signed)
Pharmacy called needing verbal ok to change RX generic anitvert dosage.   Pt was there wanting to pick up Rx.  Generic Antivert.  They wanted to change from 32 mg's to 25 mg's stating it only comes in 25 mg tablets.  OK to change per Simona Huh.  Con Memos

## 2015-11-16 LAB — COMPREHENSIVE METABOLIC PANEL
A/G RATIO: 1.8 (ref 1.2–2.2)
ALBUMIN: 4.4 g/dL (ref 3.5–4.8)
ALK PHOS: 73 IU/L (ref 39–117)
ALT: 9 IU/L (ref 0–32)
AST: 5 IU/L (ref 0–40)
BILIRUBIN TOTAL: 0.4 mg/dL (ref 0.0–1.2)
BUN / CREAT RATIO: 17 (ref 12–28)
BUN: 16 mg/dL (ref 8–27)
CO2: 24 mmol/L (ref 18–29)
CREATININE: 0.92 mg/dL (ref 0.57–1.00)
Calcium: 10.2 mg/dL (ref 8.7–10.3)
Chloride: 100 mmol/L (ref 96–106)
GFR calc Af Amer: 71 mL/min/{1.73_m2} (ref >59)
GFR calc non Af Amer: 62 mL/min/{1.73_m2} (ref >59)
GLOBULIN, TOTAL: 2.5 g/dL (ref 1.5–4.5)
Glucose: 128 mg/dL — ABNORMAL HIGH (ref 65–99)
Potassium: 4.4 mmol/L (ref 3.5–5.2)
SODIUM: 140 mmol/L (ref 134–144)
Total Protein: 6.9 g/dL (ref 6.0–8.5)

## 2015-11-16 LAB — CBC WITH DIFFERENTIAL/PLATELET
Basophils Absolute: 0 10*3/uL (ref 0.0–0.2)
Basos: 0 %
EOS (ABSOLUTE): 0.2 10*3/uL (ref 0.0–0.4)
EOS: 2 %
HEMATOCRIT: 42.9 % (ref 34.0–46.6)
HEMOGLOBIN: 14.3 g/dL (ref 11.1–15.9)
Immature Grans (Abs): 0 10*3/uL (ref 0.0–0.1)
Immature Granulocytes: 0 %
LYMPHS ABS: 1.6 10*3/uL (ref 0.7–3.1)
Lymphs: 16 %
MCH: 27.7 pg (ref 26.6–33.0)
MCHC: 33.3 g/dL (ref 31.5–35.7)
MCV: 83 fL (ref 79–97)
MONOCYTES: 5 %
Monocytes Absolute: 0.6 10*3/uL (ref 0.1–0.9)
NEUTROS ABS: 7.7 10*3/uL — AB (ref 1.4–7.0)
Neutrophils: 77 %
Platelets: 287 10*3/uL (ref 150–379)
RBC: 5.17 x10E6/uL (ref 3.77–5.28)
RDW: 14.1 % (ref 12.3–15.4)
WBC: 10.1 10*3/uL (ref 3.4–10.8)

## 2015-11-17 ENCOUNTER — Telehealth: Payer: Self-pay

## 2015-11-17 NOTE — Telephone Encounter (Signed)
Pt is returning call.  [phone]

## 2015-11-17 NOTE — Telephone Encounter (Signed)
[facility] to add test, awaiting confirmation.

## 2015-11-17 NOTE — Telephone Encounter (Signed)
-----   Message from [place], [place] sent at 11/17/2015 11:44 AM EDT ----- All tests normal except blood sugar high. Recommend getting lab to run Hgb A1C to be sure no diabetes.

## 2015-11-18 ENCOUNTER — Telehealth: Payer: Self-pay

## 2015-11-18 NOTE — Telephone Encounter (Signed)
Advised patient as below. Advised that we will give her a call as soon as results come in.

## 2015-11-18 NOTE — Telephone Encounter (Signed)
Patient advised as directed below. 

## 2015-11-18 NOTE — Telephone Encounter (Signed)
-----   Message from [place], [place] sent at 11/18/2015 10:30 AM EDT ----- Normal Hgb A1C - not diabetic levels.

## 2015-11-19 LAB — SPECIMEN STATUS REPORT

## 2015-11-19 LAB — HGB A1C W/O EAG: Hgb A1c MFr Bld: 5.6 % (ref 4.8–5.6)

## 2015-12-01 ENCOUNTER — Other Ambulatory Visit: Payer: Self-pay

## 2015-12-01 ENCOUNTER — Encounter: Payer: Self-pay | Admitting: Family Medicine

## 2015-12-01 ENCOUNTER — Ambulatory Visit (INDEPENDENT_AMBULATORY_CARE_PROVIDER_SITE_OTHER): Payer: Medicare Other | Admitting: Family Medicine

## 2015-12-01 VITALS — BP 142/92 | HR 64 | Temp 98.1°F | Resp 14 | Wt 183.4 lb

## 2015-12-01 DIAGNOSIS — H6122 Impacted cerumen, left ear: Secondary | ICD-10-CM

## 2015-12-01 NOTE — Progress Notes (Signed)
Patient ID: Stacey Fitzgerald, female   DOB: 1941/03/21, 75 y.o.   MRN: [id number]   Patient: Stacey Fitzgerald Female    DOB: 08/20/1940   75 y.o.   MRN: [id number] Visit Date: 12/01/2015  Today's Provider: Vernie Murders, PA   Chief Complaint  Patient presents with  . Ear Pain   Subjective:    Otalgia  There is pain in the left ear. This is a new problem. The current episode started 1 to 4 weeks ago. The problem occurs constantly. The problem has been waxing and waning. The patient is experiencing no pain (stopped up sensation and slightly diminished hearing.). She has tried ear drops for the symptoms. The treatment provided no relief.    History reviewed. No pertinent past medical history. Patient Active Problem List   Diagnosis Date Noted  . Abortion, spontaneous 02/02/2015  . Bacterial skin infection of leg 02/02/2015  . HLD (hyperlipidemia) 02/02/2015  . Pure hypercholesterolemia 01/28/2015  . Essential hypertension 01/28/2015  . Cellulitis 01/28/2015  . Hypothyroidism 01/28/2015  . Monilial intertrigo 01/28/2015  . Rhus dermatitis 01/28/2015  . Low back pain, episodic 01/28/2015  . Hernia, rectovaginal 10/20/2013  . Adiposity 10/20/2013  . Climacteric 10/20/2013  . BP (high blood pressure) 10/20/2013  . Hypercholesteremia 10/20/2013  . Bladder cystocele 10/20/2013  . Arthritis 10/20/2013   Past Surgical History  Procedure Laterality Date  . Tubal ligation    . Breast biopsy Left    Family History  Problem Relation Age of Onset  . Diabetes Mother   . Seizures Mother   . CVA Father   . Dementia Brother   . Diabetes Maternal Grandfather   . Mental illness Sister    Previous Medications   LEVOTHYROXINE (SYNTHROID, LEVOTHROID) 50 MCG TABLET    Take 1 tablet (50 mcg total) by mouth daily.   MECLIZINE (ANTIVERT) 32 MG TABLET    Take 1 tablet (32 mg total) by mouth 3 (three) times daily as needed.   MULTIPLE VITAMINS-MINERALS (CENTRUM SILVER ADULT 50+) TABS     Take 1 tablet by mouth daily.   NIACIN 250 MG TABLET    Take 250 mg by mouth at bedtime.   PRAVASTATIN (PRAVACHOL) 40 MG TABLET    Take 1 tablet (40 mg total) by mouth at bedtime.   QUINAPRIL (ACCUPRIL) 10 MG TABLET    Take 1 tablet (10 mg total) by mouth daily.   Allergies  Allergen Reactions  . Keflex [Cephalexin] Hives    Review of Systems  Constitutional: Negative.   HENT: Positive for ear pain.   Eyes: Negative.   Respiratory: Negative.   Cardiovascular: Negative.   Gastrointestinal: Negative.   Endocrine: Negative.   Genitourinary: Negative.   Musculoskeletal: Negative.   Skin: Negative.   Allergic/Immunologic: Negative.   Neurological: Negative.   Hematological: Negative.   Psychiatric/Behavioral: Negative.     Social History  Substance Use Topics  . Smoking status: Never Smoker   . Smokeless tobacco: Not on file  . Alcohol Use: No   Objective:   BP 142/92 mmHg  Pulse 64  Temp(Src) 98.1 F (36.7 C) (Oral)  Resp 14  Wt 183 lb 6.4 oz (83.19 kg)  Physical Exam  Constitutional: She is oriented to person, place, and time. She appears well-developed and well-nourished. No distress.  HENT:  Head: Normocephalic and atraumatic.  Right Ear: Hearing normal.  Left Ear: Hearing normal.  Nose: Nose normal.  Wax occluding left canal. Partial occlusion on the right.  Eyes: Conjunctivae  and lids are normal. Right eye exhibits no discharge. Left eye exhibits no discharge. No scleral icterus.  Neck: Neck supple.  Cardiovascular: Normal rate and regular rhythm.   Pulmonary/Chest: Effort normal. No respiratory distress.  Musculoskeletal: Normal range of motion.  Neurological: She is alert and oriented to person, place, and time.  Skin: Skin is intact. No lesion and no rash noted.  Psychiatric: She has a normal mood and affect. Her speech is normal and behavior is normal. Thought content normal.      Assessment & Plan:     1. Cerumen impaction, left Left canal  completely occluded with partial occlusion on the right. After irrigating canals clear, TM's appear normal without fluid lines or erythema. Feeling much improved and hearing better. Recheck prn. Recommend she stop using Q-Tips to clean down into canals.

## 2016-02-07 ENCOUNTER — Other Ambulatory Visit: Payer: Self-pay | Admitting: Obstetrics and Gynecology

## 2016-02-07 DIAGNOSIS — Z01419 Encounter for gynecological examination (general) (routine) without abnormal findings: Secondary | ICD-10-CM | POA: Diagnosis not present

## 2016-02-07 DIAGNOSIS — Z1231 Encounter for screening mammogram for malignant neoplasm of breast: Secondary | ICD-10-CM

## 2016-02-07 DIAGNOSIS — N951 Menopausal and female climacteric states: Secondary | ICD-10-CM | POA: Diagnosis not present

## 2016-02-07 DIAGNOSIS — Z1211 Encounter for screening for malignant neoplasm of colon: Secondary | ICD-10-CM | POA: Diagnosis not present

## 2016-02-07 DIAGNOSIS — Z124 Encounter for screening for malignant neoplasm of cervix: Secondary | ICD-10-CM | POA: Diagnosis not present

## 2016-02-15 DIAGNOSIS — N951 Menopausal and female climacteric states: Secondary | ICD-10-CM | POA: Diagnosis not present

## 2016-02-15 DIAGNOSIS — M8588 Other specified disorders of bone density and structure, other site: Secondary | ICD-10-CM | POA: Diagnosis not present

## 2016-02-22 ENCOUNTER — Ambulatory Visit: Payer: Self-pay | Attending: Obstetrics and Gynecology

## 2016-02-22 DIAGNOSIS — Z1211 Encounter for screening for malignant neoplasm of colon: Secondary | ICD-10-CM | POA: Diagnosis not present

## 2016-03-18 DIAGNOSIS — L255 Unspecified contact dermatitis due to plants, except food: Secondary | ICD-10-CM | POA: Diagnosis not present

## 2016-03-18 DIAGNOSIS — I10 Essential (primary) hypertension: Secondary | ICD-10-CM | POA: Diagnosis not present

## 2016-03-21 ENCOUNTER — Ambulatory Visit
Admission: RE | Admit: 2016-03-21 | Discharge: 2016-03-21 | Disposition: A | Discharge status: Home / Self Care | Payer: Medicare Other | Source: Ambulatory Visit | Attending: Obstetrics and Gynecology | Admitting: Obstetrics and Gynecology

## 2016-03-21 ENCOUNTER — Other Ambulatory Visit: Payer: Self-pay | Admitting: Obstetrics and Gynecology

## 2016-03-21 DIAGNOSIS — Z1231 Encounter for screening mammogram for malignant neoplasm of breast: Secondary | ICD-10-CM

## 2016-03-23 ENCOUNTER — Ambulatory Visit (INDEPENDENT_AMBULATORY_CARE_PROVIDER_SITE_OTHER): Payer: Medicare Other | Admitting: Family Medicine

## 2016-03-23 ENCOUNTER — Encounter: Payer: Self-pay | Admitting: Family Medicine

## 2016-03-23 VITALS — BP 178/102 | HR 75 | Temp 98.0°F | Resp 14 | Wt 182.4 lb

## 2016-03-23 DIAGNOSIS — L84 Corns and callosities: Secondary | ICD-10-CM

## 2016-03-23 DIAGNOSIS — R200 Anesthesia of skin: Secondary | ICD-10-CM

## 2016-03-23 DIAGNOSIS — I10 Essential (primary) hypertension: Secondary | ICD-10-CM | POA: Diagnosis not present

## 2016-03-23 DIAGNOSIS — R202 Paresthesia of skin: Secondary | ICD-10-CM

## 2016-03-23 DIAGNOSIS — L255 Unspecified contact dermatitis due to plants, except food: Secondary | ICD-10-CM | POA: Diagnosis not present

## 2016-03-23 MED ORDER — PREDNISONE 10 MG PO TABS: 10.0000 mg | ORAL_TABLET | Freq: Every day | ORAL | 0 refills | Status: DC

## 2016-03-23 NOTE — Progress Notes (Signed)
Patient: Stacey Fitzgerald Female    DOB: 04-12-1941   75 y.o.   MRN: [id number] Visit Date: 03/23/2016  Today's Provider: Vernie Murders, PA   Chief Complaint  Patient presents with  . Rash   Subjective:    Rash  This is a new problem. The current episode started 1 to 4 weeks ago (last Saturday). The problem has been gradually worsening since onset. The affected locations include the neck, left arm and left lower leg. The rash is characterized by redness and itchiness. She was exposed to plant contact. Past treatments include topical steroids. The treatment provided mild relief.   History reviewed. No pertinent past medical history. Patient Active Problem List   Diagnosis Date Noted  . Abortion, spontaneous 02/02/2015  . Bacterial skin infection of leg 02/02/2015  . HLD (hyperlipidemia) 02/02/2015  . Pure hypercholesterolemia 01/28/2015  . Essential hypertension 01/28/2015  . Cellulitis 01/28/2015  . Hypothyroidism 01/28/2015  . Monilial intertrigo 01/28/2015  . Rhus dermatitis 01/28/2015  . Low back pain, episodic 01/28/2015  . Hernia, rectovaginal 10/20/2013  . Adiposity 10/20/2013  . Climacteric 10/20/2013  . BP (high blood pressure) 10/20/2013  . Hypercholesteremia 10/20/2013  . Bladder cystocele 10/20/2013  . Arthritis 10/20/2013   Past Surgical History:  Procedure Laterality Date  . BREAST EXCISIONAL BIOPSY Left 1972   benign  . TUBAL LIGATION     Family History  Problem Relation Age of Onset  . Diabetes Mother   . Seizures Mother   . CVA Father   . Dementia Brother   . Diabetes Maternal Grandfather   . Mental illness Sister    Allergies  Allergen Reactions  . Keflex [Cephalexin] Hives   Previous Medications   LEVOTHYROXINE (SYNTHROID, LEVOTHROID) 50 MCG TABLET    Take 1 tablet (50 mcg total) by mouth daily.   MECLIZINE (ANTIVERT) 32 MG TABLET    Take 1 tablet (32 mg total) by mouth 3 (three) times daily as needed.   MULTIPLE VITAMINS-MINERALS  (CENTRUM SILVER ADULT 50+) TABS    Take 1 tablet by mouth daily.   NIACIN 250 MG TABLET    Take 250 mg by mouth at bedtime.   PRAVASTATIN (PRAVACHOL) 40 MG TABLET    Take 1 tablet (40 mg total) by mouth at bedtime.   QUINAPRIL (ACCUPRIL) 10 MG TABLET    Take 1 tablet (10 mg total) by mouth daily.    Review of Systems  Constitutional: Negative.   Respiratory: Negative.   Cardiovascular: Positive for leg swelling.  Skin: Positive for rash.    Social History  Substance Use Topics  . Smoking status: Never Smoker  . Smokeless tobacco: Not on file  . Alcohol use No   Objective:   BP (!) 178/102 (BP Location: Right Arm, Patient Position: Sitting, Cuff Size: Normal)   Pulse 75   Temp 98 F (36.7 C) (Oral)   Resp 14   Wt 182 lb 6.4 oz (82.7 kg)   BMI 27.73 kg/m   Physical Exam  Constitutional: She is oriented to person, place, and time. She appears well-developed and well-nourished. No distress.  HENT:  Head: Normocephalic and atraumatic.  Right Ear: Hearing normal.  Left Ear: Hearing normal.  Nose: Nose normal.  Eyes: Conjunctivae and lids are normal. Right eye exhibits no discharge. Left eye exhibits no discharge. No scleral icterus.  Pulmonary/Chest: Effort normal. No respiratory distress.  Musculoskeletal: Normal range of motion.  Neurological: She is alert and oriented to person, place, and time.  Skin:  Skin is intact. Rash noted. No lesion noted.  Pink pruritic rash around neck, upper chest, right arm and right lower leg. Callus on the left lateral foot with tingling in toes. No rash on the left foot.  Psychiatric: She has a normal mood and affect. Her speech is normal and behavior is normal. Thought content normal.      Assessment & Plan:     1. Rhus dermatitis Very pruritic rash over the past 10-14 days after using a weed eater at home. [facility] and was treated with Triamcinolone 0.025% Cream QID. Still having itch and rash. Will treat with prednisone  taper and may add Benadryl prn itching. Recheck prn. - predniSONE (DELTASONE) 10 MG tablet; Take 1 tablet (10 mg total) by mouth daily with breakfast. Taper by one tablet daily over 6 days (6,5,4,3,2,1)  Dispense: 21 tablet; Refill: 0  2. Corn or callus Persistent callus/corn on the plantar surface of the left foot at the distal 5th metatarsal. Has been using a corn solution and Band-Aid but it continues to return. Will schedule referral to a podiatrist at her request. - Ambulatory referral to Podiatry  3. Essential hypertension BP very high the past week or two. Will increase Accupril 10 mg to 2 tablets daily. Recheck BP in 2 weeks or sooner if needed.  4. Numbness and tingling Onset with corn/callus. Normal sensation to test with nylon string. Schedule evaluation by podiatrist. - Ambulatory referral to Podiatry

## 2016-03-29 ENCOUNTER — Ambulatory Visit (INDEPENDENT_AMBULATORY_CARE_PROVIDER_SITE_OTHER): Payer: Medicare Other

## 2016-03-29 ENCOUNTER — Encounter: Payer: Self-pay | Admitting: Podiatry

## 2016-03-29 ENCOUNTER — Ambulatory Visit (INDEPENDENT_AMBULATORY_CARE_PROVIDER_SITE_OTHER): Payer: Medicare Other | Admitting: Podiatry

## 2016-03-29 DIAGNOSIS — R52 Pain, unspecified: Secondary | ICD-10-CM

## 2016-03-29 DIAGNOSIS — G2581 Restless legs syndrome: Secondary | ICD-10-CM

## 2016-03-29 DIAGNOSIS — Q828 Other specified congenital malformations of skin: Secondary | ICD-10-CM | POA: Diagnosis not present

## 2016-03-29 DIAGNOSIS — G5792 Unspecified mononeuropathy of left lower limb: Secondary | ICD-10-CM | POA: Diagnosis not present

## 2016-03-29 MED ORDER — PRAMIPEXOLE DIHYDROCHLORIDE 0.125 MG PO TABS: 0.1250 mg | ORAL_TABLET | Freq: Every evening | ORAL | 0 refills | Status: DC

## 2016-03-29 NOTE — Patient Instructions (Signed)
Restless Legs Syndrome Restless legs syndrome is a condition that causes uncomfortable feelings or sensations in the legs, especially while sitting or lying down. The sensations usually cause an overwhelming urge to move the legs. The arms can also sometimes be affected. The condition can range from mild to severe. The symptoms often interfere with a person's ability to sleep. CAUSES The cause of this condition is not known. RISK FACTORS This condition is more likely to develop in:  People who are older than age 50.  Pregnant women. In general, restless legs syndrome is more common in women than in men.  People who have a family history of the condition.  People who have certain medical conditions, such as iron deficiency, kidney disease, Parkinson disease, or nerve damage.  People who take certain medicines, such as medicines for high blood pressure, nausea, colds, allergies, depression, and some heart conditions. SYMPTOMS The main symptom of this condition is uncomfortable sensations in the legs. These sensations may be:  Described as pulling, tingling, prickling, throbbing, crawling, or burning.  Worse while you are sitting or lying down.  Worse during periods of rest or inactivity.  Worse at night, often interfering with your sleep.  Accompanied by a very strong urge to move your legs.  Temporarily relieved by movement of your legs. The sensations usually affect both sides of the body. The arms can also be affected, but this is rare. People who have this condition often have tiredness during the day because of their lack of sleep at night. DIAGNOSIS This condition may be diagnosed based on your description of the symptoms. You may also have tests, including blood tests, to check for other conditions that may lead to your symptoms. In some cases, you may be asked to spend some time in a sleep lab so your sleeping can be monitored. TREATMENT Treatment for this condition is  focused on managing the symptoms. Treatment may include:  Self-help and lifestyle changes.  Medicines. HOME CARE INSTRUCTIONS  Take medicines only as directed by your health care provider.  Try these methods to get temporary relief from the uncomfortable sensations:  Massage your legs.  Walk or stretch.  Take a cold or hot bath.  Practice good sleep habits. For example, go to bed and get up at the same time every day.  Exercise regularly.  Practice ways of relaxing, such as yoga or meditation.  Avoid caffeine and alcohol.  Do not use any tobacco products, including cigarettes, chewing tobacco, or electronic cigarettes. If you need help quitting, ask your health care provider.  Keep all follow-up visits as directed by your health care provider. This is important. SEEK MEDICAL CARE IF: Your symptoms do not improve with treatment, or they get worse.   This information is not intended to replace advice given to you by your health care provider. Make sure you discuss any questions you have with your health care provider.   Document Released: 06/29/2002 Document Revised: 11/23/2014 Document Reviewed: 07/05/2014 Elsevier Interactive Patient Education 2016 [employer] Inc[employer]  

## 2016-03-29 NOTE — Progress Notes (Signed)
Patient ID: Stacey Fitzgerald, female   DOB: 1941/06/30, 75 y.o.   MRN: [id number]  Subjective:  Patient presents today with numbness and tingling to the left foot also painful callus to the left plantar foot. Patient states that the numbness and tingling occurs at night time where she gets shooting sensations that shoot up her foot. She's been experiencing these night events frequently for the last few evenings. Patient also complains of a painful callus to the plantar lateral forefoot of the left foot.  Objective: Physical Exam General: The patient is alert and oriented x3 in no acute distress.  Dermatology: Hyperkeratotic skin lesion noted to the plantar surface of the left lateral forefoot. Skin is warm, dry and supple bilateral lower extremities. Negative for open lesions or macerations.  Vascular: Palpable pedal pulses bilaterally. No edema or erythema noted. Capillary refill within normal limits.  Neurological: Epicritic and protective threshold grossly intact bilaterally. Numbness and tingling sensations noted to the dorsum of the left foot extending proximally  Musculoskeletal Exam: Pain on palpation overlying the heart hypertrophic keratotic skin lesion of the left plantar forefoot Range of motion within normal limits to all pedal and ankle joints bilateral. Muscle strength 5/5 in all groups bilateral.   Radiographic Exam:   Radiographic exam reveals cavus foot type with an increased calcaneal inclination angle and increased metatarsal declination angle. Also a bullet hole sinus tarsi consistent with a cavus foot type. No significant degeneration or DJD noted. Normal osseous mineralization. Joint spaces preserved. No fracture/dislocation/boney destruction.   Assessment: #1 neuritis left foot #2 restless leg syndrome left #3 pain in left foot #4 porokeratosis left plantar lateral forefoot Problem List Items Addressed This Visit    None    Visit Diagnoses    Pain    -  Primary    Relevant Orders   DG Foot Complete Right   DG Foot Complete Left        Plan of Care:  #1 Patient was evaluated. #2 Excisional debridement of the porokeratosis was performed using a chisel blade #3 prescription for Mirapex 0.125 mg was given to the patient with instructions to take 1 pill by mouth before bedtime. #4 patient is to return to the clinic in 4 weeks for follow-up evaluation     Dr. Edrick Kins, [place]

## 2016-03-29 NOTE — Progress Notes (Signed)
   Subjective:    Patient ID: Stacey Fitzgerald, female    DOB: 04-30-1941, 75 y.o.   MRN: [id number]  HPI I went to see Dr Natale Milch on Friday and I asked him about my feet and he stated to come to a foot doctor and my feet get numb across the balls of both feet and has been going on for about a month and hurts to move my toes and burns and throbs, sore and tender    Review of Systems  All other systems reviewed and are negative.      Objective:   Physical Exam        Assessment & Plan:

## 2016-04-03 ENCOUNTER — Telehealth: Payer: Self-pay | Admitting: Family Medicine

## 2016-04-03 ENCOUNTER — Other Ambulatory Visit: Payer: Self-pay | Admitting: Family Medicine

## 2016-04-03 DIAGNOSIS — L255 Unspecified contact dermatitis due to plants, except food: Secondary | ICD-10-CM

## 2016-04-03 MED ORDER — PREDNISONE 10 MG PO TABS: 10.0000 mg | ORAL_TABLET | Freq: Every day | ORAL | 0 refills | Status: DC

## 2016-04-03 NOTE — Progress Notes (Signed)
Sent refill to the [facility]. Need to see rash in 1 week in the office.

## 2016-04-03 NOTE — Telephone Encounter (Signed)
Pt needs refill on the prednisone.  She still has poison oak on her legs.  She uses [facility].  Her call back [phone]  Thanks Con Memos

## 2016-04-09 ENCOUNTER — Encounter: Payer: Self-pay | Admitting: Family Medicine

## 2016-04-09 ENCOUNTER — Ambulatory Visit (INDEPENDENT_AMBULATORY_CARE_PROVIDER_SITE_OTHER): Payer: Medicare Other | Admitting: Family Medicine

## 2016-04-09 VITALS — BP 154/78 | HR 64 | Temp 98.0°F | Resp 16 | Wt 181.0 lb

## 2016-04-09 DIAGNOSIS — I10 Essential (primary) hypertension: Secondary | ICD-10-CM

## 2016-04-09 DIAGNOSIS — G2581 Restless legs syndrome: Secondary | ICD-10-CM | POA: Diagnosis not present

## 2016-04-09 DIAGNOSIS — L255 Unspecified contact dermatitis due to plants, except food: Secondary | ICD-10-CM | POA: Diagnosis not present

## 2016-04-09 NOTE — Progress Notes (Signed)
Patient: Stacey Fitzgerald Female    DOB: 27-Mar-1941   75 y.o.   MRN: [id number] Visit Date: 04/09/2016  Today's Provider: Vernie Murders, PA   Chief Complaint  Patient presents with  . Hypertension  . Rash  . Numbness   Subjective:    HPI     Follow up for Hypertension  The patient was last seen for this 3 weeks ago. Changes made at last visit include increase Accupril to 10 mg 2 tablets daily.  She reports poor compliance with treatment. She feels that condition is Unchanged. Pt reports her BP is only high when coming for an OV. Is still taking 10 mg qd. ------------------------------------------------------------------------------------  Follow up for Rhus Dermatitis  The patient was last seen for this 3 weeks ago. Changes made at last visit include adding Prednisone taper and Benadryl PRN.Marland Kitchen  She reports excellent compliance with treatment. She feels that condition is Improved. Is about 80% improved per pt.   ------------------------------------------------------------------------------------   Follow up for Numbness and Tingling  The patient was last seen for this 3 weeks ago. Changes made at last visit include referring to podiatry..  Dr. Amalia Hailey prescribed pt Mirapex 0.125 mg for numbness in toes, without relief. Pt reports she stopped taking the medication. ------------------------------------------------------------------------------------ History reviewed. No pertinent past medical history. Patient Active Problem List   Diagnosis Date Noted  . Abortion, spontaneous 02/02/2015  . Bacterial skin infection of leg 02/02/2015  . HLD (hyperlipidemia) 02/02/2015  . Pure hypercholesterolemia 01/28/2015  . Essential hypertension 01/28/2015  . Cellulitis 01/28/2015  . Hypothyroidism 01/28/2015  . Monilial intertrigo 01/28/2015  . Rhus dermatitis 01/28/2015  . Low back pain, episodic 01/28/2015  . Hernia, rectovaginal 10/20/2013  . Adiposity  10/20/2013  . Climacteric 10/20/2013  . BP (high blood pressure) 10/20/2013  . Hypercholesteremia 10/20/2013  . Bladder cystocele 10/20/2013  . Arthritis 10/20/2013   Past Surgical History:  Procedure Laterality Date  . BREAST EXCISIONAL BIOPSY Left 1972   benign  . TUBAL LIGATION      Allergies  Allergen Reactions  . Keflex [Cephalexin] Hives    Current Outpatient Prescriptions:  .  levothyroxine (SYNTHROID, LEVOTHROID) 50 MCG tablet, Take 1 tablet (50 mcg total) by mouth daily., Disp: 90 tablet, Rfl: 3 .  meclizine (ANTIVERT) 32 MG tablet, Take 1 tablet (32 mg total) by mouth 3 (three) times daily as needed., Disp: 30 tablet, Rfl: 0 .  Multiple Vitamins-Minerals (CENTRUM SILVER ADULT 50+) TABS, Take 1 tablet by mouth daily., Disp: , Rfl:  .  niacin 250 MG tablet, Take 250 mg by mouth at bedtime., Disp: , Rfl:  .  pravastatin (PRAVACHOL) 40 MG tablet, Take 1 tablet (40 mg total) by mouth at bedtime., Disp: 90 tablet, Rfl: 3 .  quinapril (ACCUPRIL) 10 MG tablet, Take 1 tablet (10 mg total) by mouth daily., Disp: 90 tablet, Rfl: 3 .  pramipexole (MIRAPEX) 0.125 MG tablet, Take 1 tablet (0.125 mg total) by mouth every evening. (Patient not taking: Reported on 04/09/2016), Disp: 30 tablet, Rfl: 0  Review of Systems  Constitutional: Negative for activity change, appetite change, chills, diaphoresis, fatigue, fever and unexpected weight change.  Cardiovascular: Negative for chest pain, palpitations and leg swelling.  Skin: Positive for rash.  Neurological: Positive for numbness.    Social History  Substance Use Topics  . Smoking status: Never Smoker  . Smokeless tobacco: Not on file  . Alcohol use No   Objective:   BP (!) 154/78 (BP  Location: Right Arm, Patient Position: Sitting, Cuff Size: Large)   Pulse 64   Temp 98 F (36.7 C) (Oral)   Resp 16   Wt 181 lb (82.1 kg)   BMI 27.52 kg/m  BP Readings from Last 3 Encounters:  04/09/16 (!) 154/78  03/23/16 (!) 178/102    12/01/15 (!) 142/92   Physical Exam  Constitutional: She is oriented to person, place, and time. She appears well-developed and well-nourished.  HENT:  Head: Normocephalic.  Eyes: Conjunctivae are normal.  Neck: Neck supple.  Cardiovascular: Normal rate and regular rhythm.   Pulmonary/Chest: Effort normal and breath sounds normal.  Abdominal: Soft. Bowel sounds are normal.  Neurological: She is alert and oriented to person, place, and time.      Assessment & Plan:     1. Essential hypertension Improvement in BP control. Continues to use the Quinapril 10 mg qd. Plans to follow up the end of October to follow up cholesterol and BP.  2. Rhus dermatitis Refilled the prednisone and did the second taper treatment. Rash has cleared now. No further itching or rash.  3. Restless leg syndrome Was evaluated by Dr. Amalia Hailey (podiatrist) and diagnosed restless leg syndrome with some neuritis in the left foot. Slightly better with the use of Mirapex 0.125 mg hs. Recommend she continue to use Mirapex and follow up with Dr. Amalia Hailey as planned.       Vernie Murders, PA  [facility]

## 2016-04-25 ENCOUNTER — Other Ambulatory Visit: Payer: Self-pay | Admitting: Podiatry

## 2016-04-27 ENCOUNTER — Ambulatory Visit: Payer: Medicare Other | Admitting: Podiatry

## 2016-04-27 NOTE — Telephone Encounter (Signed)
Pt needs an appt for follow up.

## 2016-05-10 ENCOUNTER — Other Ambulatory Visit: Payer: Self-pay | Admitting: Family Medicine

## 2016-05-10 NOTE — Telephone Encounter (Signed)
Please review-aa 

## 2016-05-10 NOTE — Telephone Encounter (Signed)
Last office visit for rash was on 04-09-16 and it was gone. If she has more rash now, need to be seen to know if it is the same.

## 2016-05-10 NOTE — Telephone Encounter (Signed)
Pt contacted office for refill request on the following medications: predniSONE (DELTASONE) 10 MG tablet Last written: 04/03/16 Last OV: 04/09/16 Pt stated that the poison ivy still hasn't cleared up all the way and wanted to see if she could get this refilled. Tried to schedule appt, pt stated she wanted to see if it could be refilled without an OV. Please advise. Thanks TNP

## 2016-05-11 ENCOUNTER — Ambulatory Visit (INDEPENDENT_AMBULATORY_CARE_PROVIDER_SITE_OTHER): Payer: Medicare Other | Admitting: Family Medicine

## 2016-05-11 ENCOUNTER — Encounter: Payer: Self-pay | Admitting: Family Medicine

## 2016-05-11 VITALS — BP 170/94 | HR 72 | Temp 97.5°F | Resp 14 | Wt 180.0 lb

## 2016-05-11 DIAGNOSIS — G609 Hereditary and idiopathic neuropathy, unspecified: Secondary | ICD-10-CM | POA: Diagnosis not present

## 2016-05-11 DIAGNOSIS — E039 Hypothyroidism, unspecified: Secondary | ICD-10-CM

## 2016-05-11 DIAGNOSIS — I10 Essential (primary) hypertension: Secondary | ICD-10-CM | POA: Diagnosis not present

## 2016-05-11 DIAGNOSIS — L299 Pruritus, unspecified: Secondary | ICD-10-CM

## 2016-05-11 DIAGNOSIS — E785 Hyperlipidemia, unspecified: Secondary | ICD-10-CM | POA: Diagnosis not present

## 2016-05-11 DIAGNOSIS — L308 Other specified dermatitis: Secondary | ICD-10-CM

## 2016-05-11 MED ORDER — BETAMETHASONE DIPROPIONATE 0.05 % EX CREA: TOPICAL_CREAM | Freq: Two times a day (BID) | CUTANEOUS | 0 refills | Status: DC

## 2016-05-11 NOTE — Telephone Encounter (Signed)
Please review-aa 

## 2016-05-11 NOTE — Progress Notes (Signed)
Patient: Stacey Fitzgerald Female    DOB: May 22, 1941   75 y.o.   MRN: [id number] Visit Date: 05/11/2016  Today's Provider: Vernie Murders, PA   Chief Complaint  Patient presents with  . Rash   Subjective:    HPI Patient is here today to follow up on poison oak on right leg. Patient was seen on 03/23/2016 and started prednisone taper and was advised to take Benadryl as needed for itching. Patient called back on 04/03/2016 requesting a refill on prednisone because poison oak was still on her legs. Prednisone refilled.  Patient came back for a follow up on 04/09/2016. Prednisone was refilled and did the 2nd taper treatment. Rash was clear at that visit and patient denied itching.   Patient reports rash reappeared on right leg after completing Prednisone. She reports slight itchiness.   No past medical history on file. Patient Active Problem List   Diagnosis Date Noted  . Abortion, spontaneous 02/02/2015  . Bacterial skin infection of leg 02/02/2015  . HLD (hyperlipidemia) 02/02/2015  . Pure hypercholesterolemia 01/28/2015  . Essential hypertension 01/28/2015  . Cellulitis 01/28/2015  . Hypothyroidism 01/28/2015  . Monilial intertrigo 01/28/2015  . Rhus dermatitis 01/28/2015  . Low back pain, episodic 01/28/2015  . Hernia, rectovaginal 10/20/2013  . Adiposity 10/20/2013  . Climacteric 10/20/2013  . BP (high blood pressure) 10/20/2013  . Hypercholesteremia 10/20/2013  . Bladder cystocele 10/20/2013  . Arthritis 10/20/2013   Past Surgical History:  Procedure Laterality Date  . BREAST EXCISIONAL BIOPSY Left 1972   benign  . TUBAL LIGATION     Family History  Problem Relation Age of Onset  . Diabetes Mother   . Seizures Mother   . CVA Father   . Dementia Brother   . Diabetes Maternal Grandfather   . Mental illness Sister    Allergies  Allergen Reactions  . Keflex [Cephalexin] Hives     Previous Medications   LEVOTHYROXINE (SYNTHROID, LEVOTHROID) 50 MCG TABLET     Take 1 tablet (50 mcg total) by mouth daily.   MECLIZINE (ANTIVERT) 32 MG TABLET    Take 1 tablet (32 mg total) by mouth 3 (three) times daily as needed.   MULTIPLE VITAMINS-MINERALS (CENTRUM SILVER ADULT 50+) TABS    Take 1 tablet by mouth daily.   NIACIN 250 MG TABLET    Take 250 mg by mouth at bedtime.   PRAMIPEXOLE (MIRAPEX) 0.125 MG TABLET    TAKE 1 TABLET (0.125 MG TOTAL) BY MOUTH EVERY EVENING.   PRAVASTATIN (PRAVACHOL) 40 MG TABLET    Take 1 tablet (40 mg total) by mouth at bedtime.   QUINAPRIL (ACCUPRIL) 10 MG TABLET    Take 1 tablet (10 mg total) by mouth daily.    Review of Systems  Constitutional: Negative.   Respiratory: Negative.   Skin: Positive for rash.    Social History  Substance Use Topics  . Smoking status: Never Smoker  . Smokeless tobacco: Not on file  . Alcohol use No   Objective:   BP (!) 170/94 (BP Location: Right Arm, Patient Position: Sitting, Cuff Size: Normal)   Pulse 72   Temp 97.5 F (36.4 C) (Oral)   Resp 14   Wt 180 lb (81.6 kg)   BMI 27.37 kg/m   Physical Exam  Constitutional: She is oriented to person, place, and time. She appears well-developed and well-nourished. No distress.  HENT:  Head: Normocephalic and atraumatic.  Right Ear: Hearing normal.  Left Ear: Hearing normal.  Nose:  Nose normal.  Eyes: Conjunctivae and lids are normal. Right eye exhibits no discharge. Left eye exhibits no discharge. No scleral icterus.  Neck: Neck supple.  Cardiovascular: Normal rate and regular rhythm.   Pulmonary/Chest: Effort normal and breath sounds normal. No respiratory distress.  Abdominal: Soft. Bowel sounds are normal.  Musculoskeletal: Normal range of motion.  Neurological: She is alert and oriented to person, place, and time.  Sharp pains shooting pains in the left foot at night. Normal sensation to test with nylon string today. Good pulses.  Skin: Skin is intact. Rash noted. No lesion noted.  Pink to red pruritic rash with pinpoint scabs  along the lateral side of the right lower leg. No blisters or swelling.  Psychiatric: She has a normal mood and affect. Her speech is normal and behavior is normal. Thought content normal.      Assessment & Plan:     1. Pruritic dermatitis Recurrence over the past week of itchy rash. Has been helping husband with cutting and carrying firewood. Will treat with Diprolene and may use antihistamine prn. Recheck if no better in a week. - CBC with Differential/Platelet - betamethasone dipropionate (DIPROLENE) 0.05 % cream; Apply topically 2 (two) times daily.  Dispense: 30 g; Refill: 0  2. Essential hypertension Some elevation today. States she gets "nervous" in the "doctor's office" and BP better at home. Still taking Quinapril 10 mg qd but does not want to take any extra medications or increase dosage yet. Recommend restricting salt and caffeine. Recheck labs and follow up pending reports. - CBC with Differential/Platelet - Comprehensive metabolic panel - TSH  3. Hypothyroidism, unspecified type Tolerating levothyroxine 50 mcg qd. Recheck TSH and CBC. - CBC with Differential/Platelet  4. Hyperlipidemia, unspecified hyperlipidemia type No new muscle pains taking the Pravastatin 40 mg qd. Follow low fat diet and exercise regularly. Recheck labs and follow up pending report. - Comprehensive metabolic panel - TSH - Lipid panel  5. Idiopathic peripheral neuropathy Long term sharp pains in left foot at night. Denies daytime pains. Has had some elevation of BS April 2017. Recheck labs and proceed with follow up with podiatrist next week. Does not feel the Mirapex has been helping. May need to consider Lyrica or Gabapentin. - CBC with Differential/Platelet - Comprehensive metabolic panel - Hemoglobin A1c

## 2016-05-11 NOTE — Telephone Encounter (Signed)
Patient's husband Herbie Baltimore advised. He is going to call patient to advise her and she will call back to schedule a appointment if she decides to come in for a office visit.

## 2016-05-15 ENCOUNTER — Ambulatory Visit (INDEPENDENT_AMBULATORY_CARE_PROVIDER_SITE_OTHER): Payer: Medicare Other | Admitting: Podiatry

## 2016-05-15 DIAGNOSIS — Q828 Other specified congenital malformations of skin: Secondary | ICD-10-CM | POA: Diagnosis not present

## 2016-05-15 DIAGNOSIS — E039 Hypothyroidism, unspecified: Secondary | ICD-10-CM | POA: Diagnosis not present

## 2016-05-15 DIAGNOSIS — G6289 Other specified polyneuropathies: Secondary | ICD-10-CM

## 2016-05-15 DIAGNOSIS — I10 Essential (primary) hypertension: Secondary | ICD-10-CM | POA: Diagnosis not present

## 2016-05-15 DIAGNOSIS — R52 Pain, unspecified: Secondary | ICD-10-CM | POA: Diagnosis not present

## 2016-05-15 DIAGNOSIS — G2581 Restless legs syndrome: Secondary | ICD-10-CM

## 2016-05-15 DIAGNOSIS — G5792 Unspecified mononeuropathy of left lower limb: Secondary | ICD-10-CM | POA: Diagnosis not present

## 2016-05-15 DIAGNOSIS — L299 Pruritus, unspecified: Secondary | ICD-10-CM | POA: Diagnosis not present

## 2016-05-15 DIAGNOSIS — E785 Hyperlipidemia, unspecified: Secondary | ICD-10-CM | POA: Diagnosis not present

## 2016-05-15 MED ORDER — GABAPENTIN 100 MG PO CAPS: 100.0000 mg | ORAL_CAPSULE | Freq: Three times a day (TID) | ORAL | 3 refills | Status: DC

## 2016-05-15 MED ORDER — NONFORMULARY OR COMPOUNDED ITEM: 1.0000 g | Freq: Four times a day (QID) | 2 refills | Status: DC

## 2016-05-16 ENCOUNTER — Telehealth: Payer: Self-pay

## 2016-05-16 ENCOUNTER — Telehealth: Payer: Self-pay | Admitting: Family Medicine

## 2016-05-16 LAB — COMPREHENSIVE METABOLIC PANEL
A/G RATIO: 1.8 (ref 1.2–2.2)
ALK PHOS: 62 IU/L (ref 39–117)
ALT: 10 IU/L (ref 0–32)
AST: 7 IU/L (ref 0–40)
Albumin: 4.1 g/dL (ref 3.5–4.8)
BILIRUBIN TOTAL: 0.6 mg/dL (ref 0.0–1.2)
BUN/Creatinine Ratio: 16 (ref 12–28)
BUN: 14 mg/dL (ref 8–27)
CHLORIDE: 104 mmol/L (ref 96–106)
CO2: 26 mmol/L (ref 18–29)
Calcium: 9.7 mg/dL (ref 8.7–10.3)
Creatinine, Ser: 0.85 mg/dL (ref 0.57–1.00)
GFR calc Af Amer: 78 mL/min/{1.73_m2} (ref >59)
GFR calc non Af Amer: 68 mL/min/{1.73_m2} (ref >59)
GLOBULIN, TOTAL: 2.3 g/dL (ref 1.5–4.5)
Glucose: 94 mg/dL (ref 65–99)
POTASSIUM: 4.1 mmol/L (ref 3.5–5.2)
SODIUM: 143 mmol/L (ref 134–144)
Total Protein: 6.4 g/dL (ref 6.0–8.5)

## 2016-05-16 LAB — LIPID PANEL
CHOL/HDL RATIO: 4.5 ratio — AB (ref 0.0–4.4)
Cholesterol, Total: 180 mg/dL (ref 100–199)
HDL: 40 mg/dL (ref >39)
LDL Calculated: 110 mg/dL — ABNORMAL HIGH (ref 0–99)
Triglycerides: 150 mg/dL — ABNORMAL HIGH (ref 0–149)
VLDL Cholesterol Cal: 30 mg/dL (ref 5–40)

## 2016-05-16 LAB — CBC WITH DIFFERENTIAL/PLATELET
BASOS: 1 %
Basophils Absolute: 0.1 10*3/uL (ref 0.0–0.2)
EOS (ABSOLUTE): 0.2 10*3/uL (ref 0.0–0.4)
EOS: 3 %
HEMATOCRIT: 39.1 % (ref 34.0–46.6)
Hemoglobin: 13.2 g/dL (ref 11.1–15.9)
Immature Grans (Abs): 0 10*3/uL (ref 0.0–0.1)
Immature Granulocytes: 0 %
LYMPHS ABS: 1.7 10*3/uL (ref 0.7–3.1)
Lymphs: 24 %
MCH: 28.3 pg (ref 26.6–33.0)
MCHC: 33.8 g/dL (ref 31.5–35.7)
MCV: 84 fL (ref 79–97)
MONOS ABS: 0.5 10*3/uL (ref 0.1–0.9)
Monocytes: 7 %
Neutrophils Absolute: 4.7 10*3/uL (ref 1.4–7.0)
Neutrophils: 65 %
Platelets: 279 10*3/uL (ref 150–379)
RBC: 4.67 x10E6/uL (ref 3.77–5.28)
RDW: 13.3 % (ref 12.3–15.4)
WBC: 7.2 10*3/uL (ref 3.4–10.8)

## 2016-05-16 LAB — TSH: TSH: 2.22 u[IU]/mL (ref 0.450–4.500)

## 2016-05-16 LAB — HEMOGLOBIN A1C
ESTIMATED AVERAGE GLUCOSE: 108 mg/dL
HEMOGLOBIN A1C: 5.4 % (ref 4.8–5.6)

## 2016-05-16 NOTE — Telephone Encounter (Signed)
LMTCB

## 2016-05-16 NOTE — Telephone Encounter (Signed)
-----   Message from Margo Common, [place] sent at 05/16/2016  2:06 PM EDT ----- All blood tests normal except triglycerides and LDL still a little elevated, but, better than last check. Blood sugar and Hgb A1C normal without sign of diabetes. Good thyroid function test. Continue present medication and low fat diet. Recheck progress in 6 months.

## 2016-05-16 NOTE — Telephone Encounter (Signed)
Pt returned call to [facility] regarding results.  Con Memos

## 2016-05-17 NOTE — Telephone Encounter (Signed)
Patient advised.

## 2016-05-27 NOTE — Progress Notes (Signed)
Patient ID: Stacey Fitzgerald, female   DOB: [birth year], 75 y.o.   MRN: [id number]  Subjective: Patient presents today for follow-up treatment of neuritis of the left foot as well as restless leg syndrome left lower extremity. Patient also has chronic porokeratosis to the left plantar lateral forefoot. Patient presents today for further treatment and evaluation. Patient states that the pain has gotten worse progressively and that the Mirapex 0.125mg  medication does not help  Objective: Physical Exam General: The patient is alert and oriented x3 in no acute distress.  Dermatology: Hyperkeratotic skin lesion noted to the plantar surface of the left lateral forefoot. Skin is warm, dry and supple bilateral lower extremities. Negative for open lesions or macerations.  Vascular: Palpable pedal pulses bilaterally. No edema or erythema noted. Capillary refill within normal limits.  Neurological: Epicritic and protective threshold grossly intact bilaterally. Numbness and tingling sensations noted to the dorsum of the left foot extending proximally  Musculoskeletal Exam: Pain on palpation overlying the heart hypertrophic keratotic skin lesion of the left plantar forefoot Range of motion within normal limits to all pedal and ankle joints bilateral. Muscle strength 5/5 in all groups bilateral.   Radiographic Exam:   Radiographic exam reveals cavus foot type with an increased calcaneal inclination angle and increased metatarsal declination angle. Also a bullet hole sinus tarsi consistent with a cavus foot type. No significant degeneration or DJD noted. Normal osseous mineralization. Joint spaces preserved. No fracture/dislocation/boney destruction.   Assessment: #1 neuritis left foot #2 restless leg syndrome left #3 pain in left foot #4 porokeratosis left plantar lateral forefoot #5 peripheral neuropathy without diabetes Problem List Items Addressed This Visit    None        Plan of Care:  #1  Patient was evaluated. #2 Excisional debridement of the porokeratosis was performed using a chisel blade #3 Discontinue Mirapex 0.125 mg.  #4 prescription for gabapentin 100 mg 3 times a day #5 prescription for neuropathic pain cream dispensed through [facility] #6 return to clinic in 4 weeks     Dr. Edrick Kins, [place]

## 2016-06-19 ENCOUNTER — Ambulatory Visit (INDEPENDENT_AMBULATORY_CARE_PROVIDER_SITE_OTHER): Payer: Medicare Other | Admitting: Podiatry

## 2016-06-19 DIAGNOSIS — E1143 Type 2 diabetes mellitus with diabetic autonomic (poly)neuropathy: Secondary | ICD-10-CM | POA: Diagnosis not present

## 2016-06-19 DIAGNOSIS — G6289 Other specified polyneuropathies: Secondary | ICD-10-CM

## 2016-06-19 DIAGNOSIS — G2581 Restless legs syndrome: Secondary | ICD-10-CM

## 2016-06-19 DIAGNOSIS — G5792 Unspecified mononeuropathy of left lower limb: Secondary | ICD-10-CM

## 2016-06-19 DIAGNOSIS — R52 Pain, unspecified: Secondary | ICD-10-CM | POA: Diagnosis not present

## 2016-06-20 NOTE — Progress Notes (Signed)
Patient ID: Stacey Fitzgerald, female   DOB: 1941/07/14, 75 y.o.   MRN: [id number]  Subjective: Patient presents today for follow-up treatment of neuritis of the left foot as well as restless leg syndrome left lower extremity. Patient states that the pain has lessened and she believes the gabapentin is helping with the pain.  Objective: Physical Exam General: The patient is alert and oriented x3 in no acute distress.  Dermatology: Skin is warm, dry and supple bilateral lower extremities. Negative for open lesions or macerations.  Vascular: Palpable pedal pulses bilaterally. No edema or erythema noted. Capillary refill within normal limits.  Neurological: Epicritic and protective threshold grossly intact bilaterally. Numbness and tingling sensations noted to the dorsum of the left foot extending proximally  Musculoskeletal Exam: Pain on palpation overlying the heart hypertrophic keratotic skin lesion of the left plantar forefoot Range of motion within normal limits to all pedal and ankle joints bilateral. Muscle strength 5/5 in all groups bilateral.   Radiographic Exam:   Radiographic exam reveals cavus foot type with an increased calcaneal inclination angle and increased metatarsal declination angle. Also a bullet hole sinus tarsi consistent with a cavus foot type. No significant degeneration or DJD noted. Normal osseous mineralization. Joint spaces preserved. No fracture/dislocation/boney destruction.   Assessment: #1 neuritis left foot #2 restless leg syndrome left #3 pain in left foot #4 peripheral neuropathy without diabetes Problem List Items Addressed This Visit    None        Plan of Care:  #1 Patient was evaluated. #2 Patient is to continue gabapentin medication with peripheral neuropathy cream dispensed through [place] #3 discussed conservative management and importance of maintaining blood sugar in order to alleviate neuropathic symptoms. #4 return to clinic  when necessary    Dr. Edrick Kins, [place]

## 2016-08-05 ENCOUNTER — Other Ambulatory Visit: Payer: Self-pay | Admitting: Family Medicine

## 2016-08-05 DIAGNOSIS — E039 Hypothyroidism, unspecified: Secondary | ICD-10-CM

## 2016-08-27 ENCOUNTER — Ambulatory Visit (INDEPENDENT_AMBULATORY_CARE_PROVIDER_SITE_OTHER): Payer: Medicare Other | Admitting: Family Medicine

## 2016-08-27 ENCOUNTER — Encounter: Payer: Self-pay | Admitting: Family Medicine

## 2016-08-27 VITALS — BP 148/98 | HR 53 | Temp 98.1°F | Resp 14 | Wt 181.8 lb

## 2016-08-27 DIAGNOSIS — S0003XA Contusion of scalp, initial encounter: Secondary | ICD-10-CM

## 2016-08-27 DIAGNOSIS — W108XXA Fall (on) (from) other stairs and steps, initial encounter: Secondary | ICD-10-CM

## 2016-08-27 NOTE — Progress Notes (Signed)
Patient: Stacey Fitzgerald Female    DOB: 1940-11-12   76 y.o.   MRN: [id number] Visit Date: 08/27/2016  Today's Provider: Vernie Murders, PA   Chief Complaint  Patient presents with  . Fall   Subjective:    Fall  Incident onset: Saturday. The fall occurred while walking. Impact surface: brick walkway. There was no blood loss. Point of impact: back of head. Associated symptoms include headaches (last night). She has tried NSAID for the symptoms.  Patient denies vision changes and dizziness.   Patient Active Problem List   Diagnosis Date Noted  . Abortion, spontaneous 02/02/2015  . Bacterial skin infection of leg 02/02/2015  . HLD (hyperlipidemia) 02/02/2015  . Pure hypercholesterolemia 01/28/2015  . Essential hypertension 01/28/2015  . Cellulitis 01/28/2015  . Hypothyroidism 01/28/2015  . Monilial intertrigo 01/28/2015  . Rhus dermatitis 01/28/2015  . Low back pain, episodic 01/28/2015  . Hernia, rectovaginal 10/20/2013  . Adiposity 10/20/2013  . Climacteric 10/20/2013  . BP (high blood pressure) 10/20/2013  . Hypercholesteremia 10/20/2013  . Bladder cystocele 10/20/2013  . Arthritis 10/20/2013   Past Surgical History:  Procedure Laterality Date  . BREAST EXCISIONAL BIOPSY Left 1972   benign  . TUBAL LIGATION     Family History  Problem Relation Age of Onset  . Diabetes Mother   . Seizures Mother   . CVA Father   . Dementia Brother   . Diabetes Maternal Grandfather   . Mental illness Sister    Allergies  Allergen Reactions  . Keflex [Cephalexin] Hives     Previous Medications   BETAMETHASONE DIPROPIONATE (DIPROLENE) 0.05 % CREAM    Apply topically 2 (two) times daily.   GABAPENTIN (NEURONTIN) 100 MG CAPSULE    Take 1 capsule (100 mg total) by mouth 3 (three) times daily.   LEVOTHYROXINE (SYNTHROID, LEVOTHROID) 50 MCG TABLET    TAKE 1 TABLET (50 MCG TOTAL) BY MOUTH DAILY.   MECLIZINE (ANTIVERT) 32 MG TABLET    Take 1 tablet (32 mg total) by mouth 3 (three)  times daily as needed.   MULTIPLE VITAMINS-MINERALS (CENTRUM SILVER ADULT 50+) TABS    Take 1 tablet by mouth daily.   NIACIN 250 MG TABLET    Take 250 mg by mouth at bedtime.   NONFORMULARY OR COMPOUNDED ITEM    Apply 1-2 g topically 4 (four) times daily. Peripheral Neuropathy cream: Bupivacaine 1% Doxepin 3% Gabapentin 6% Ibuprofen 3% Pentoxifylline 3%   PRAMIPEXOLE (MIRAPEX) 0.125 MG TABLET    TAKE 1 TABLET (0.125 MG TOTAL) BY MOUTH EVERY EVENING.   PRAVASTATIN (PRAVACHOL) 40 MG TABLET    Take 1 tablet (40 mg total) by mouth at bedtime.   QUINAPRIL (ACCUPRIL) 10 MG TABLET    Take 1 tablet (10 mg total) by mouth daily.    Review of Systems  Constitutional: Negative.   Eyes: Negative for visual disturbance.  Respiratory: Negative.   Cardiovascular: Negative.   Neurological: Positive for headaches (last night). Negative for dizziness.    Social History  Substance Use Topics  . Smoking status: Never Smoker  . Smokeless tobacco: Never Used  . Alcohol use No   Objective:   BP (!) 148/98 (BP Location: Right Arm, Patient Position: Sitting, Cuff Size: Normal)   Pulse (!) 53   Temp 98.1 F (36.7 C) (Oral)   Resp 14   Wt 181 lb 12.8 oz (82.5 kg)   SpO2 98%   BMI 27.64 kg/m   Physical Exam  Constitutional: She is oriented  to person, place, and time. She appears well-developed and well-nourished. No distress.  HENT:  Head: Normocephalic and atraumatic.  Right Ear: Hearing and external ear normal.  Left Ear: Hearing and external ear normal.  Nose: Nose normal.  Mouth/Throat: Oropharynx is clear and moist.  Eyes: Conjunctivae, EOM and lids are normal. Pupils are equal, round, and reactive to light. Right eye exhibits no discharge. Left eye exhibits no discharge. No scleral icterus.  Neck: Normal range of motion. Neck supple.  Cardiovascular: Normal rate and regular rhythm.   Pulmonary/Chest: Effort normal and breath sounds normal. No respiratory distress.  Abdominal: Soft. Bowel  sounds are normal.  Musculoskeletal: Normal range of motion.  Lymphadenopathy:    She has no cervical adenopathy.  Neurological: She is alert and oriented to person, place, and time.  Skin: Skin is intact. No lesion and no rash noted.  Psychiatric: She has a normal mood and affect. Her speech is normal and behavior is normal. Thought content normal.      Assessment & Plan:     1. Fall (on) (from) other stairs and steps, initial encounter Had new tennis shoes on and tripped as she was trying to step down out a door down two steps to a brick sidewalk on2-2-18. No LOC or lacerations. Has small hematoma on the right parietal scalp. Some generalized soreness in arms, shoulder and neck. Headache went away the next day. No vision changes, balance issues or nausea. Given fall prevention handout. Recheck prn.  2. Contusion of scalp, initial encounter No abrasions or laceration. Normal vision. Contusion secondary to fall. Recheck pr.

## 2016-08-27 NOTE — Patient Instructions (Signed)

## 2016-08-30 ENCOUNTER — Telehealth: Payer: Self-pay | Admitting: Family Medicine

## 2016-08-30 NOTE — Telephone Encounter (Signed)
Called Pt to schedule AWV with [facility] - knb °

## 2016-09-28 ENCOUNTER — Encounter: Payer: Self-pay | Admitting: Family Medicine

## 2016-09-28 ENCOUNTER — Ambulatory Visit (INDEPENDENT_AMBULATORY_CARE_PROVIDER_SITE_OTHER): Payer: Medicare Other | Admitting: Family Medicine

## 2016-09-28 VITALS — BP 148/84 | HR 80 | Temp 97.9°F | Resp 16 | Wt 176.0 lb

## 2016-09-28 DIAGNOSIS — E7800 Pure hypercholesterolemia, unspecified: Secondary | ICD-10-CM | POA: Diagnosis not present

## 2016-09-28 DIAGNOSIS — M791 Myalgia, unspecified site: Secondary | ICD-10-CM

## 2016-09-28 NOTE — Progress Notes (Addendum)
Patient: Stacey Fitzgerald Female    DOB: Apr 13, 1941   76 y.o.   MRN: [id number] Visit Date: 09/28/2016  Today's Provider: Vernie Murders, PA   Chief Complaint  Patient presents with  . Fall   Subjective:    HPI     Follow up for Fall  The patient was last seen for this 4 weeks ago. Initial fall occurred 08/24/2016. Changes made at last visit include none. Pt was not experiencing any concussion sx, and did not have any lacerations at that time.  She feels that condition is Worse. Pt is c/o hip and leg pain since the fall. Pt has tried NSAIDs, with short-term relief. Pt denies neurological sx.  ------------------------------------------------------------------------------------  Patient Active Problem List   Diagnosis Date Noted  . Abortion, spontaneous 02/02/2015  . Bacterial skin infection of leg 02/02/2015  . HLD (hyperlipidemia) 02/02/2015  . Pure hypercholesterolemia 01/28/2015  . Essential hypertension 01/28/2015  . Cellulitis 01/28/2015  . Hypothyroidism 01/28/2015  . Monilial intertrigo 01/28/2015  . Rhus dermatitis 01/28/2015  . Low back pain, episodic 01/28/2015  . Hernia, rectovaginal 10/20/2013  . Adiposity 10/20/2013  . Climacteric 10/20/2013  . BP (high blood pressure) 10/20/2013  . Hypercholesteremia 10/20/2013  . Bladder cystocele 10/20/2013  . Arthritis 10/20/2013   Past Surgical History:  Procedure Laterality Date  . BREAST EXCISIONAL BIOPSY Left 1972   benign  . TUBAL LIGATION     Family History  Problem Relation Age of Onset  . Diabetes Mother   . Seizures Mother   . CVA Father   . Dementia Brother   . Diabetes Maternal Grandfather   . Mental illness Sister     Allergies  Allergen Reactions  . Keflex [Cephalexin] Hives     Current Outpatient Prescriptions:  .  levothyroxine (SYNTHROID, LEVOTHROID) 50 MCG tablet, TAKE 1 TABLET (50 MCG TOTAL) BY MOUTH DAILY., Disp: 90 tablet, Rfl: 2 .  Multiple Vitamins-Minerals (CENTRUM  SILVER ADULT 50+) TABS, Take 1 tablet by mouth daily., Disp: , Rfl:  .  niacin 250 MG tablet, Take 250 mg by mouth at bedtime., Disp: , Rfl:  .  pravastatin (PRAVACHOL) 40 MG tablet, Take 1 tablet (40 mg total) by mouth at bedtime., Disp: 90 tablet, Rfl: 3 .  quinapril (ACCUPRIL) 10 MG tablet, Take 1 tablet (10 mg total) by mouth daily., Disp: 90 tablet, Rfl: 3  Review of Systems  Constitutional: Negative for activity change, appetite change, chills, diaphoresis, fatigue, fever and unexpected weight change.  Respiratory: Negative for shortness of breath.   Cardiovascular: Negative for chest pain, palpitations and leg swelling.  Musculoskeletal: Positive for arthralgias and myalgias.  Neurological: Negative for dizziness, weakness, light-headedness and headaches.    Social History  Substance Use Topics  . Smoking status: Never Smoker  . Smokeless tobacco: Never Used  . Alcohol use No   Objective:   BP (!) 148/84 (BP Location: Left Arm, Patient Position: Sitting, Cuff Size: Large)   Pulse 80   Temp 97.9 F (36.6 C) (Oral)   Resp 16   Wt 176 lb (79.8 kg)   BMI 26.76 kg/m  Vitals:   09/28/16 1558  BP: (!) 148/84  Pulse: 80  Resp: 16  Temp: 97.9 F (36.6 C)  TempSrc: Oral  Weight: 176 lb (79.8 kg)   Physical Exam  Constitutional: She is oriented to person, place, and time. She appears well-developed and well-nourished. No distress.  HENT:  Head: Normocephalic and atraumatic.  Right Ear: Hearing  normal.  Left Ear: Hearing normal.  Nose: Nose normal.  Eyes: Conjunctivae and lids are normal. Right eye exhibits no discharge. Left eye exhibits no discharge. No scleral icterus.  Pulmonary/Chest: Effort normal. No respiratory distress.  Musculoskeletal: She exhibits tenderness.  Anterior thigh muscles when she first stands. No bruising, swelling or erythema. No tenderness to palpate. Only occurs with muscle contraction. Normal pulses and good ROM. Pulses 2+ both lower extremities.  Many varicose veins.  Neurological: She is alert and oriented to person, place, and time. She has normal reflexes.  No muscle weakness.  Skin: Skin is intact. No lesion and no rash noted.  Psychiatric: She has a normal mood and affect. Her speech is normal and behavior is normal. Thought content normal.   Depression screen PHQ 2/9 09/28/2016  Decreased Interest 0  Down, Depressed, Hopeless 0  PHQ - 2 Score 0  Altered sleeping 0  Tired, decreased energy 0  Change in appetite 0  Feeling bad or failure about yourself  0  Trouble concentrating 0  Moving slowly or fidgety/restless 0  Suicidal thoughts 0  PHQ-9 Score 0  Difficult doing work/chores Not difficult at all   Fall Risk  09/28/2016  Falls in the past year? Yes  Number falls in past yr: 1  Injury with Fall? No  Follow up Falls evaluation completed      Assessment & Plan:     1. Muscle pain Recurrence over the past 3-4 days. Was better after fall accident a month ago. Pain in thigh muscles when she first stands. No known recent injury. Ibuprofen and Tylenol helps. Some improvement after walking a bit. No new injury. May stop the Pravastatin and will check labs for signs of rhabdomyolysis.  - CBC with Differential/Platelet - CK Total (and CKMB) - C-reactive protein  2. Pure hypercholesterolemia Trying to continue low fat diet. Worried about muscle pains (crying during interview). May stop the Pravastatin and check labs. Apply moist heat and rest when possible. - Lipid panel - Comprehensive metabolic panel     Patient seen and examined by Vernie Murders, PA, and note scribed by Renaldo Fiddler, CMA.  Vernie Murders, PA  [facility]

## 2016-10-03 DIAGNOSIS — E7800 Pure hypercholesterolemia, unspecified: Secondary | ICD-10-CM | POA: Diagnosis not present

## 2016-10-03 DIAGNOSIS — M791 Myalgia: Secondary | ICD-10-CM | POA: Diagnosis not present

## 2016-10-04 ENCOUNTER — Telehealth: Payer: Self-pay | Admitting: Family Medicine

## 2016-10-04 ENCOUNTER — Telehealth: Payer: Self-pay

## 2016-10-04 LAB — CBC WITH DIFFERENTIAL/PLATELET
BASOS ABS: 0.1 10*3/uL (ref 0.0–0.2)
BASOS: 1 %
EOS (ABSOLUTE): 0.2 10*3/uL (ref 0.0–0.4)
Eos: 2 %
Hematocrit: 37.5 % (ref 34.0–46.6)
Hemoglobin: 12.3 g/dL (ref 11.1–15.9)
IMMATURE GRANS (ABS): 0 10*3/uL (ref 0.0–0.1)
IMMATURE GRANULOCYTES: 0 %
LYMPHS: 19 %
Lymphocytes Absolute: 1.9 10*3/uL (ref 0.7–3.1)
MCH: 27.3 pg (ref 26.6–33.0)
MCHC: 32.8 g/dL (ref 31.5–35.7)
MCV: 83 fL (ref 79–97)
Monocytes Absolute: 0.6 10*3/uL (ref 0.1–0.9)
Monocytes: 6 %
NEUTROS PCT: 72 %
Neutrophils Absolute: 7.3 10*3/uL — ABNORMAL HIGH (ref 1.4–7.0)
PLATELETS: 347 10*3/uL (ref 150–379)
RBC: 4.5 x10E6/uL (ref 3.77–5.28)
RDW: 13.1 % (ref 12.3–15.4)
WBC: 10.1 10*3/uL (ref 3.4–10.8)

## 2016-10-04 LAB — COMPREHENSIVE METABOLIC PANEL
ALBUMIN: 4.1 g/dL (ref 3.5–4.8)
ALT: 8 IU/L (ref 0–32)
AST: 7 IU/L (ref 0–40)
Albumin/Globulin Ratio: 1.8 (ref 1.2–2.2)
Alkaline Phosphatase: 71 IU/L (ref 39–117)
BUN / CREAT RATIO: 19 (ref 12–28)
BUN: 14 mg/dL (ref 8–27)
Bilirubin Total: 0.5 mg/dL (ref 0.0–1.2)
CALCIUM: 9.7 mg/dL (ref 8.7–10.3)
CO2: 25 mmol/L (ref 18–29)
CREATININE: 0.74 mg/dL (ref 0.57–1.00)
Chloride: 104 mmol/L (ref 96–106)
GFR calc Af Amer: 92 mL/min/{1.73_m2} (ref >59)
GFR, EST NON AFRICAN AMERICAN: 80 mL/min/{1.73_m2} (ref >59)
Globulin, Total: 2.3 g/dL (ref 1.5–4.5)
Glucose: 87 mg/dL (ref 65–99)
Potassium: 4.2 mmol/L (ref 3.5–5.2)
SODIUM: 144 mmol/L (ref 134–144)
Total Protein: 6.4 g/dL (ref 6.0–8.5)

## 2016-10-04 LAB — CK TOTAL AND CKMB (NOT AT ARMC)
CK-MB Index: 3.7 ng/mL (ref 0.0–5.3)
Total CK: 73 U/L (ref 24–173)

## 2016-10-04 LAB — LIPID PANEL
CHOLESTEROL TOTAL: 183 mg/dL (ref 100–199)
Chol/HDL Ratio: 4.5 ratio units — ABNORMAL HIGH (ref 0.0–4.4)
HDL: 41 mg/dL (ref >39)
LDL Calculated: 119 mg/dL — ABNORMAL HIGH (ref 0–99)
Triglycerides: 114 mg/dL (ref 0–149)
VLDL CHOLESTEROL CAL: 23 mg/dL (ref 5–40)

## 2016-10-04 LAB — C-REACTIVE PROTEIN: CRP: 26.3 mg/L — ABNORMAL HIGH (ref 0.0–4.9)

## 2016-10-04 MED ORDER — MELOXICAM 15 MG PO TABS: 15.0000 mg | ORAL_TABLET | Freq: Every day | ORAL | 0 refills | Status: DC

## 2016-10-04 NOTE — Telephone Encounter (Signed)
Advised pt of lab results. Pt verbally acknowledges understanding. Meloxicam sent in and FU scheduled. Renaldo Fiddler, CMA

## 2016-10-04 NOTE — Telephone Encounter (Signed)
Discussed labs with pt. See result notes. Renaldo Fiddler, CMA

## 2016-10-04 NOTE — Telephone Encounter (Signed)
Pt called regarding her lab results from Tuesday  Her call back is Holly  Thanks teri

## 2016-10-04 NOTE — Telephone Encounter (Signed)
-----   Message from Margo Common, [place] sent at 10/04/2016  9:40 AM EDT ----- Cholesterol improving with use of Pravastatin. No sign of muscle damage but inflammation indicator high. Recommend using Meloxicam 15 mg qd #30 and recheck legs in 2 weeks.

## 2016-10-16 ENCOUNTER — Other Ambulatory Visit: Payer: Self-pay | Admitting: Family Medicine

## 2016-10-16 DIAGNOSIS — I10 Essential (primary) hypertension: Secondary | ICD-10-CM

## 2016-10-19 ENCOUNTER — Ambulatory Visit (INDEPENDENT_AMBULATORY_CARE_PROVIDER_SITE_OTHER): Payer: Medicare Other | Admitting: Family Medicine

## 2016-10-19 ENCOUNTER — Encounter: Payer: Self-pay | Admitting: Family Medicine

## 2016-10-19 VITALS — BP 130/78 | HR 88 | Temp 97.5°F | Resp 16 | Wt 174.0 lb

## 2016-10-19 DIAGNOSIS — I10 Essential (primary) hypertension: Secondary | ICD-10-CM

## 2016-10-19 DIAGNOSIS — M1990 Unspecified osteoarthritis, unspecified site: Secondary | ICD-10-CM

## 2016-10-19 NOTE — Progress Notes (Signed)
Patient: Stacey Fitzgerald Female    DOB: 1940/12/08   76 y.o.   MRN: [id number] Visit Date: 10/19/2016  Today's Provider: Vernie Murders, PA   Chief Complaint  Patient presents with  . Myalgias   Subjective:    HPI     Follow up for Myalgias  The patient was last seen for this 3 weeks ago for leg pain/myalgias. Changes made at last visit include D/C Pravastatin and starting Meloxicam 15 mg.  She reports excellent compliance with treatment. She feels that condition is Improved. Pt reports she is 50% improved. She is not having side effects.   Lab Results  Component Value Date   CHOL 183 10/03/2016   CHOL 180 05/15/2016   CHOL 187 08/18/2015   Lab Results  Component Value Date   HDL 41 10/03/2016   HDL 40 05/15/2016   HDL 44 08/18/2015   Lab Results  Component Value Date   LDLCALC 119 (H) 10/03/2016   LDLCALC 110 (H) 05/15/2016   LDLCALC 108 (H) 08/18/2015   Lab Results  Component Value Date   TRIG 114 10/03/2016   TRIG 150 (H) 05/15/2016   TRIG 173 (H) 08/18/2015   Lab Results  Component Value Date   CHOLHDL 4.5 (H) 10/03/2016   CHOLHDL 4.5 (H) 05/15/2016   CHOLHDL 4.3 08/18/2015   No results found for: LDLDIRECT   ------------------------------------------------------------------------------------  Patient Active Problem List   Diagnosis Date Noted  . Abortion, spontaneous 02/02/2015  . Bacterial skin infection of leg 02/02/2015  . HLD (hyperlipidemia) 02/02/2015  . Pure hypercholesterolemia 01/28/2015  . Essential hypertension 01/28/2015  . Cellulitis 01/28/2015  . Hypothyroidism 01/28/2015  . Monilial intertrigo 01/28/2015  . Rhus dermatitis 01/28/2015  . Low back pain, episodic 01/28/2015  . Hernia, rectovaginal 10/20/2013  . Adiposity 10/20/2013  . Climacteric 10/20/2013  . BP (high blood pressure) 10/20/2013  . Hypercholesteremia 10/20/2013  . Bladder cystocele 10/20/2013  . Arthritis 10/20/2013   Past Surgical History:    Procedure Laterality Date  . BREAST EXCISIONAL BIOPSY Left 1972   benign  . TUBAL LIGATION     Family History  Problem Relation Age of Onset  . Diabetes Mother   . Seizures Mother   . CVA Father   . Dementia Brother   . Diabetes Maternal Grandfather   . Mental illness Sister     Allergies  Allergen Reactions  . Keflex [Cephalexin] Hives     Current Outpatient Prescriptions:  .  levothyroxine (SYNTHROID, LEVOTHROID) 50 MCG tablet, TAKE 1 TABLET (50 MCG TOTAL) BY MOUTH DAILY., Disp: 90 tablet, Rfl: 2 .  meloxicam (MOBIC) 15 MG tablet, Take 1 tablet (15 mg total) by mouth daily., Disp: 30 tablet, Rfl: 0 .  Multiple Vitamins-Minerals (CENTRUM SILVER ADULT 50+) TABS, Take 1 tablet by mouth daily., Disp: , Rfl:  .  niacin 250 MG tablet, Take 250 mg by mouth at bedtime., Disp: , Rfl:  .  quinapril (ACCUPRIL) 10 MG tablet, TAKE 1 TABLET (10 MG TOTAL) BY MOUTH DAILY., Disp: 90 tablet, Rfl: 2 .  pravastatin (PRAVACHOL) 40 MG tablet, Take 1 tablet (40 mg total) by mouth at bedtime. (Patient not taking: Reported on 10/19/2016), Disp: 90 tablet, Rfl: 3  Review of Systems  Constitutional: Negative for activity change, appetite change, chills, diaphoresis, fatigue, fever and unexpected weight change.  Cardiovascular: Negative for chest pain, palpitations and leg swelling.  Musculoskeletal: Positive for myalgias.    Social History  Substance Use Topics  .  Smoking status: Never Smoker  . Smokeless tobacco: Never Used  . Alcohol use 0.0 oz/week     Comment: rare; during the holidays   Objective:   BP 130/78 (BP Location: Right Arm, Patient Position: Sitting, Cuff Size: Large)   Pulse 88   Temp 97.5 F (36.4 C) (Oral)   Resp 16   Wt 174 lb (78.9 kg)   BMI 26.46 kg/m  Vitals:   10/19/16 1553  BP: 130/78  Pulse: 88  Resp: 16  Temp: 97.5 F (36.4 C)  TempSrc: Oral  Weight: 174 lb (78.9 kg)   Physical Exam  Constitutional: She is oriented to person, place, and time. She  appears well-developed and well-nourished. No distress.  HENT:  Head: Normocephalic and atraumatic.  Right Ear: Hearing normal.  Left Ear: Hearing normal.  Nose: Nose normal.  Eyes: Conjunctivae and lids are normal. Right eye exhibits no discharge. Left eye exhibits no discharge. No scleral icterus.  Neck: Neck supple.  Cardiovascular: Normal rate and regular rhythm.   Pulmonary/Chest: Effort normal. No respiratory distress.  Abdominal: Soft.  Musculoskeletal:  Some ache and slight stiffness in neck to reach overhead. Fair ROM in hips and knees. No longer having aching pains in thigh muscles.   Neurological: She is alert and oriented to person, place, and time.  Skin: Skin is intact. No lesion and no rash noted.  Psychiatric: She has a normal mood and affect. Her speech is normal and behavior is normal. Thought content normal.      Assessment & Plan:     1. Arthritis Improved pains in legs since starting the Meloxicam and stopping the Pravastatin. Will continue this regimen with moist heat prn to thigh muscle aches. Recheck in a month if still a problem. CPK and CRP was normal without signs of muscle damage.  2. Essential hypertension Well controlled BP. Need refill of Accupril. Recheck in 3 months.     Patient seen and examined by Vernie Murders, PA, and note scribed by Renaldo Fiddler, CMA.  Vernie Murders, PA  [facility]

## 2016-10-26 ENCOUNTER — Telehealth: Payer: Self-pay | Admitting: Family Medicine

## 2016-10-26 NOTE — Telephone Encounter (Signed)
Called Pt to schedule AWV with [facility] - knb °

## 2016-10-31 ENCOUNTER — Other Ambulatory Visit: Payer: Self-pay | Admitting: Family Medicine

## 2016-12-14 ENCOUNTER — Ambulatory Visit (INDEPENDENT_AMBULATORY_CARE_PROVIDER_SITE_OTHER): Payer: Medicare Other | Admitting: Family Medicine

## 2016-12-14 ENCOUNTER — Ambulatory Visit
Admission: RE | Admit: 2016-12-14 | Discharge: 2016-12-14 | Disposition: A | Discharge status: Home / Self Care | Payer: Medicare Other | Source: Ambulatory Visit | Attending: Family Medicine | Admitting: Family Medicine

## 2016-12-14 ENCOUNTER — Encounter: Payer: Self-pay | Admitting: Family Medicine

## 2016-12-14 VITALS — BP 138/82 | HR 56 | Temp 98.7°F | Wt 176.4 lb

## 2016-12-14 DIAGNOSIS — M25512 Pain in left shoulder: Secondary | ICD-10-CM

## 2016-12-14 DIAGNOSIS — M25511 Pain in right shoulder: Secondary | ICD-10-CM | POA: Diagnosis not present

## 2016-12-14 DIAGNOSIS — M19011 Primary osteoarthritis, right shoulder: Secondary | ICD-10-CM | POA: Diagnosis not present

## 2016-12-14 NOTE — Progress Notes (Signed)
Patient: Stacey Fitzgerald Female    DOB: 30-Nov-1940   76 y.o.   MRN: [id number] Visit Date: 12/14/2016  Today's Provider: Vernie Murders, PA   Chief Complaint  Patient presents with  . Shoulder Pain   Subjective:    Shoulder Pain   The pain is present in the left shoulder and right shoulder. This is a new problem. Episode onset: 2 months ago. The problem occurs constantly. The quality of the pain is described as aching. Treatments tried: Tylenol Arthritis  The treatment provided mild relief. Her past medical history is significant for osteoarthritis.   Patient Active Problem List   Diagnosis Date Noted  . Abortion, spontaneous 02/02/2015  . Bacterial skin infection of leg 02/02/2015  . HLD (hyperlipidemia) 02/02/2015  . Pure hypercholesterolemia 01/28/2015  . Essential hypertension 01/28/2015  . Cellulitis 01/28/2015  . Hypothyroidism 01/28/2015  . Monilial intertrigo 01/28/2015  . Rhus dermatitis 01/28/2015  . Low back pain, episodic 01/28/2015  . Hernia, rectovaginal 10/20/2013  . Adiposity 10/20/2013  . Climacteric 10/20/2013  . BP (high blood pressure) 10/20/2013  . Hypercholesteremia 10/20/2013  . Bladder cystocele 10/20/2013  . Arthritis 10/20/2013   Past Surgical History:  Procedure Laterality Date  . BREAST EXCISIONAL BIOPSY Left 1972   benign  . TUBAL LIGATION     Family History  Problem Relation Age of Onset  . Diabetes Mother   . Seizures Mother   . CVA Father   . Dementia Brother   . Diabetes Maternal Grandfather   . Mental illness Sister    Allergies  Allergen Reactions  . Keflex [Cephalexin] Hives     Previous Medications   LEVOTHYROXINE (SYNTHROID, LEVOTHROID) 50 MCG TABLET    TAKE 1 TABLET (50 MCG TOTAL) BY MOUTH DAILY.   MELOXICAM (MOBIC) 15 MG TABLET    TAKE 1 TABLET (15 MG TOTAL) BY MOUTH DAILY.   MULTIPLE VITAMINS-MINERALS (CENTRUM SILVER ADULT 50+) TABS    Take 1 tablet by mouth daily.   NIACIN 250 MG TABLET    Take 250 mg by mouth at  bedtime.   PRAVASTATIN (PRAVACHOL) 40 MG TABLET    Take 1 tablet (40 mg total) by mouth at bedtime.   QUINAPRIL (ACCUPRIL) 10 MG TABLET    TAKE 1 TABLET (10 MG TOTAL) BY MOUTH DAILY.    Review of Systems  Constitutional: Negative.   Respiratory: Negative.   Musculoskeletal: Positive for arthralgias.    Social History  Substance Use Topics  . Smoking status: Never Smoker  . Smokeless tobacco: Never Used  . Alcohol use 0.0 oz/week     Comment: rare; during the holidays   Objective:   BP 138/82 (BP Location: Left Arm, Patient Position: Sitting, Cuff Size: Normal)   Pulse (!) 56   Temp 98.7 F (37.1 C) (Oral)   Wt 176 lb 6.4 oz (80 kg)   SpO2 98%   BMI 26.82 kg/m   Physical Exam  Constitutional: She is oriented to person, place, and time. She appears well-developed and well-nourished. No distress.  HENT:  Head: Normocephalic and atraumatic.  Right Ear: Hearing normal.  Left Ear: Hearing normal.  Nose: Nose normal.  Eyes: Conjunctivae and lids are normal. Right eye exhibits no discharge. Left eye exhibits no discharge. No scleral icterus.  Neck: Neck supple.  Cardiovascular: Normal rate and regular rhythm.   Pulmonary/Chest: Effort normal and breath sounds normal. No respiratory distress.  Abdominal: Soft.  Musculoskeletal:  Pain in the left shoulder to reach behind back or  above shoulder level. Good pulses and strength.  Neurological: She is alert and oriented to person, place, and time.  Skin: Skin is intact. No lesion and no rash noted.  Psychiatric: She has a normal mood and affect. Her speech is normal and behavior is normal. Thought content normal.      Assessment & Plan:     1. Bilateral shoulder pain, unspecified chronicity Onset over the past 2 weeks without a specific injury. Arthritis Tylenol with Meloxicam helps ease pain. May use Icy Hot with Lidocaine and get x-rays of each shoulder. Suspect arthritis. Recheck pending reports. - DG Shoulder Right - DG  Shoulder Left

## 2016-12-18 ENCOUNTER — Telehealth: Payer: Self-pay

## 2016-12-18 NOTE — Telephone Encounter (Signed)
-----   Message from Margo Common, [place] sent at 12/14/2016  5:54 PM EDT ----- No bone fracture or destruction in the left shoulder. Mild right shoulder spurs and some degenerative/arthritic changes. If no help from the medication and cream rub, will schedule an orthopedic referral for more aggressive therapy.

## 2016-12-18 NOTE — Telephone Encounter (Signed)
LMTCB

## 2016-12-19 NOTE — Telephone Encounter (Signed)
Patient advised.

## 2017-01-09 DIAGNOSIS — I10 Essential (primary) hypertension: Secondary | ICD-10-CM | POA: Diagnosis not present

## 2017-01-09 DIAGNOSIS — Z800 Family history of malignant neoplasm of digestive organs: Secondary | ICD-10-CM | POA: Diagnosis not present

## 2017-02-12 DIAGNOSIS — N8110 Cystocele, unspecified: Secondary | ICD-10-CM | POA: Diagnosis not present

## 2017-02-12 DIAGNOSIS — N816 Rectocele: Secondary | ICD-10-CM | POA: Diagnosis not present

## 2017-02-12 DIAGNOSIS — N8182 Incompetence or weakening of pubocervical tissue: Secondary | ICD-10-CM | POA: Diagnosis not present

## 2017-02-26 ENCOUNTER — Other Ambulatory Visit: Payer: Self-pay | Admitting: Family Medicine

## 2017-02-26 MED ORDER — MELOXICAM 15 MG PO TABS: 15.0000 mg | ORAL_TABLET | Freq: Every day | ORAL | 1 refills | Status: DC

## 2017-02-26 NOTE — Telephone Encounter (Signed)
[facility] supply for the following medication. Thanks CC  meloxicam (MOBIC) 15 MG tablet  >Take 1 tablet ( 15 MG Total ) by mouth daily.

## 2017-04-12 ENCOUNTER — Encounter: Payer: Self-pay | Admitting: *Deleted

## 2017-04-15 ENCOUNTER — Ambulatory Visit
Admission: RE | Admit: 2017-04-15 | Payer: Medicare Other | Source: Ambulatory Visit | Admitting: Unknown Physician Specialty

## 2017-04-15 ENCOUNTER — Encounter: Admission: RE | Payer: Self-pay | Source: Ambulatory Visit

## 2017-04-15 HISTORY — DX: Menopausal and female climacteric states: N95.1

## 2017-04-15 HISTORY — DX: Obesity, unspecified: E66.9

## 2017-04-15 HISTORY — DX: Rectocele: N81.6

## 2017-04-15 HISTORY — DX: Unspecified osteoarthritis, unspecified site: M19.90

## 2017-04-15 HISTORY — DX: Hypothyroidism, unspecified: E03.9

## 2017-04-15 HISTORY — DX: Essential (primary) hypertension: I10

## 2017-04-15 HISTORY — DX: Cystocele, unspecified: N81.10

## 2017-04-15 HISTORY — DX: Hyperlipidemia, unspecified: E78.5

## 2017-04-15 SURGERY — COLONOSCOPY WITH PROPOFOL
Anesthesia: General

## 2017-04-18 ENCOUNTER — Telehealth: Payer: Self-pay | Admitting: Family Medicine

## 2017-05-03 ENCOUNTER — Ambulatory Visit: Payer: Medicare Other | Admitting: Podiatry

## 2017-05-06 ENCOUNTER — Encounter: Payer: Self-pay | Admitting: Podiatry

## 2017-05-06 ENCOUNTER — Ambulatory Visit (INDEPENDENT_AMBULATORY_CARE_PROVIDER_SITE_OTHER): Payer: Medicare Other | Admitting: Podiatry

## 2017-05-06 VITALS — BP 182/88 | HR 73 | Resp 16

## 2017-05-06 DIAGNOSIS — M76812 Anterior tibial syndrome, left leg: Secondary | ICD-10-CM

## 2017-05-06 NOTE — Progress Notes (Signed)
   Subjective:    Patient ID: Stacey Fitzgerald, female    DOB: 1940/07/28, 76 y.o.   MRN: [id number]  HPI: She presents today with 3 months of pain 7 out of 10 to the medial aspect of the midfoot and anterior ankle and leg. She states that runs up the leg at nighttime on sleeping or in the evening. Denies any trauma. States that it feels similar as it did last year.  Review of Systems  All other systems reviewed and are negative.      Objective:   Physical Exam: Vital signs are stable she is alert and oriented 3 pulses are palpable. Neurologic sensorium appears to be intact deep tendon reflexes are intact muscle strength +5 over 5 dorsiflexion plantar flexors and inverters everters all intrinsic musculature is intact. Orthopedic evaluation demonstrates painful patient tibialis anterior tendon as it inserts in the medial cuneiform. There is boggy fluid collection in this area.        Assessment & Plan:  Tibialis anterior tendinitis insertional left.  Plan: Allayne Gitelman today with Kenalog and local anesthetic follow-up with me as needed.

## 2017-05-16 ENCOUNTER — Other Ambulatory Visit: Payer: Self-pay | Admitting: Family Medicine

## 2017-05-16 DIAGNOSIS — Z1231 Encounter for screening mammogram for malignant neoplasm of breast: Secondary | ICD-10-CM

## 2017-05-27 ENCOUNTER — Ambulatory Visit (INDEPENDENT_AMBULATORY_CARE_PROVIDER_SITE_OTHER): Payer: Medicare Other | Admitting: Family Medicine

## 2017-05-27 ENCOUNTER — Encounter: Payer: Self-pay | Admitting: Family Medicine

## 2017-05-27 VITALS — BP 184/100 | HR 82 | Temp 98.5°F | Ht 68.0 in | Wt 178.8 lb

## 2017-05-27 DIAGNOSIS — I10 Essential (primary) hypertension: Secondary | ICD-10-CM

## 2017-05-27 DIAGNOSIS — L237 Allergic contact dermatitis due to plants, except food: Secondary | ICD-10-CM

## 2017-05-27 DIAGNOSIS — E7800 Pure hypercholesterolemia, unspecified: Secondary | ICD-10-CM

## 2017-05-27 MED ORDER — TRIAMCINOLONE ACETONIDE 0.025 % EX CREA: 1.0000 "application " | TOPICAL_CREAM | Freq: Two times a day (BID) | CUTANEOUS | 0 refills | Status: DC

## 2017-05-27 NOTE — Progress Notes (Signed)
Patient: Stacey Fitzgerald Female    DOB: 29-Sep-1940   76 y.o.   MRN: [id number] Visit Date: 05/27/2017  Today's Provider: Vernie Murders, PA   Chief Complaint  Patient presents with  . Rash   Subjective:    Rash  This is a new problem. Episode onset: Friday. The affected locations include the face (under left eye). The rash is characterized by redness and itchiness. She was exposed to plant contact (posion ivy or oak). Past treatments include nothing.       Patient declines Health Maintenance today including Flu, Prevnar 13, and Tetanus. Colonoscopy was scheduled for October 2018. Patient states she will reschedule appointment.  Past Medical History:  Diagnosis Date  . Arthritis   . Female cystocele   . Hyperlipidemia   . Hypertension   . Hypothyroidism   . Menopausal state   . Obesity, unspecified   . Rectocele    Past Surgical History:  Procedure Laterality Date  . BREAST EXCISIONAL BIOPSY Left 1972   benign  . BREAST SURGERY    . COLONOSCOPY    . TUBAL LIGATION     Family History  Problem Relation Age of Onset  . Diabetes Mother   . Seizures Mother   . CVA Father   . Dementia Brother   . Diabetes Maternal Grandfather   . Mental illness Sister    Allergies  Allergen Reactions  . Keflex [Cephalexin] Hives    Current Outpatient Medications:  .  levothyroxine (SYNTHROID, LEVOTHROID) 50 MCG tablet, TAKE 1 TABLET (50 MCG TOTAL) BY MOUTH DAILY., Disp: 90 tablet, Rfl: 2 .  meloxicam (MOBIC) 15 MG tablet, Take 1 tablet (15 mg total) by mouth daily., Disp: 90 tablet, Rfl: 1 .  Multiple Vitamins-Minerals (CENTRUM SILVER ADULT 50+) TABS, Take 1 tablet by mouth daily., Disp: , Rfl:  .  niacin 250 MG tablet, Take 250 mg by mouth at bedtime., Disp: , Rfl:  .  pravastatin (PRAVACHOL) 40 MG tablet, Take 1 tablet (40 mg total) by mouth at bedtime., Disp: 90 tablet, Rfl: 3 .  quinapril (ACCUPRIL) 10 MG tablet, TAKE 1 TABLET (10 MG TOTAL) BY MOUTH DAILY., Disp: 90 tablet,  Rfl: 2  Review of Systems  Constitutional: Negative.   Respiratory: Negative.   Cardiovascular: Negative.   Skin: Positive for rash.   Social History   Tobacco Use  . Smoking status: Never Smoker  . Smokeless tobacco: Never Used  Substance Use Topics  . Alcohol use: Yes    Alcohol/week: 0.0 oz    Comment: rare; during the holidays   Objective:   BP (!) 184/100 (BP Location: Right Arm, Patient Position: Sitting, Cuff Size: Normal)   Pulse 82   Temp 98.5 F (36.9 C) (Oral)   Ht 5\' 8"  (1.727 m)   Wt 178 lb 12.8 oz (81.1 kg)   SpO2 97%   BMI 27.19 kg/m  BP Readings from Last 3 Encounters:  05/27/17 (!) 184/100  05/06/17 (!) 182/88  12/14/16 138/82   Physical Exam  Constitutional: She is oriented to person, place, and time. She appears well-developed and well-nourished. No distress.  HENT:  Head: Normocephalic and atraumatic.  Right Ear: Hearing normal.  Left Ear: Hearing normal.  Nose: Nose normal.  Eyes: Conjunctivae and lids are normal. Right eye exhibits no discharge. Left eye exhibits no discharge. No scleral icterus.  Neck: Neck supple.  Cardiovascular: Normal rate.  Pulmonary/Chest: Effort normal and breath sounds normal. No respiratory distress.  Abdominal: Soft.  Musculoskeletal:  Normal range of motion.  Neurological: She is alert and oriented to person, place, and time.  Skin: Skin is intact. Rash noted. No lesion noted.  Pink pruritic rash on the left cheek.  Psychiatric: She has a normal mood and affect. Her speech is normal and behavior is normal. Thought content normal.      Assessment & Plan:     1. Allergic contact dermatitis due to plants, except food Onset 3 days ago after working in her [place] bushes. Very itchy rash on the left cheek. May use Triamcinolone sparingly to rash BID. Recheck in 5 days if no better. - triamcinolone (KENALOG) 0.025 % cream; Apply 1 application 2 (two) times daily topically.  Dispense: 30 g; Refill: 0  2. Essential  hypertension Still taking Accupril 10 mg qd. BP High today and need to increase dosage to 20 mg qd. Limit salt intake and recheck labs in 4-5 days. - CBC with Differential/Platelet; Future - Comprehensive metabolic panel; Future - Lipid panel; Future - TSH; Future  3. Hypercholesteremia Tolerating Pravastatin 40 mg qd without side effects. Continue low fat diet and recheck labs. - Comprehensive metabolic panel; Future - Lipid panel; Future - TSH; Future        Vernie Murders, PA  [facility]

## 2017-05-31 ENCOUNTER — Other Ambulatory Visit: Payer: Self-pay

## 2017-05-31 DIAGNOSIS — I10 Essential (primary) hypertension: Secondary | ICD-10-CM

## 2017-05-31 DIAGNOSIS — E7800 Pure hypercholesterolemia, unspecified: Secondary | ICD-10-CM

## 2017-05-31 DIAGNOSIS — Z1231 Encounter for screening mammogram for malignant neoplasm of breast: Secondary | ICD-10-CM

## 2017-05-31 LAB — LIPID PANEL
Cholesterol: 226 mg/dL — ABNORMAL HIGH (ref <200)
HDL: 45 mg/dL — ABNORMAL LOW (ref >50)
LDL Cholesterol (Calc): 151 mg/dL (calc) — ABNORMAL HIGH
Non-HDL Cholesterol (Calc): 181 mg/dL (calc) — ABNORMAL HIGH (ref <130)
Total CHOL/HDL Ratio: 5 (calc) — ABNORMAL HIGH (ref <5.0)
Triglycerides: 161 mg/dL — ABNORMAL HIGH (ref <150)

## 2017-05-31 LAB — CBC WITH DIFFERENTIAL/PLATELET
BASOS PCT: 1 %
Basophils Absolute: 83 cells/uL (ref 0–200)
Eosinophils Absolute: 174 cells/uL (ref 15–500)
Eosinophils Relative: 2.1 %
HEMATOCRIT: 41.8 % (ref 35.0–45.0)
HEMOGLOBIN: 13.9 g/dL (ref 11.7–15.5)
LYMPHS ABS: 1710 {cells}/uL (ref 850–3900)
MCH: 27.6 pg (ref 27.0–33.0)
MCHC: 33.3 g/dL (ref 32.0–36.0)
MCV: 82.9 fL (ref 80.0–100.0)
MPV: 9.8 fL (ref 7.5–12.5)
Monocytes Relative: 6.3 %
NEUTROS ABS: 5810 {cells}/uL (ref 1500–7800)
Neutrophils Relative %: 70 %
Platelets: 303 10*3/uL (ref 140–400)
RBC: 5.04 10*6/uL (ref 3.80–5.10)
RDW: 12.8 % (ref 11.0–15.0)
Total Lymphocyte: 20.6 %
WBC mixed population: 523 cells/uL (ref 200–950)
WBC: 8.3 10*3/uL (ref 3.8–10.8)

## 2017-05-31 LAB — COMPLETE METABOLIC PANEL WITH GFR
AG Ratio: 1.5 (calc) (ref 1.0–2.5)
ALBUMIN MSPROF: 4 g/dL (ref 3.6–5.1)
ALT: 9 U/L (ref 6–29)
AST: 9 U/L — AB (ref 10–35)
Alkaline phosphatase (APISO): 62 U/L (ref 33–130)
BUN: 14 mg/dL (ref 7–25)
CALCIUM: 9.5 mg/dL (ref 8.6–10.4)
CHLORIDE: 104 mmol/L (ref 98–110)
CO2: 29 mmol/L (ref 20–32)
CREATININE: 0.87 mg/dL (ref 0.60–0.93)
GFR, EST AFRICAN AMERICAN: 76 mL/min/{1.73_m2} (ref >=60)
GFR, EST NON AFRICAN AMERICAN: 65 mL/min/{1.73_m2} (ref >=60)
Globulin: 2.6 g/dL (calc) (ref 1.9–3.7)
Glucose, Bld: 93 mg/dL (ref 65–99)
Potassium: 4.4 mmol/L (ref 3.5–5.3)
SODIUM: 140 mmol/L (ref 135–146)
Total Bilirubin: 0.6 mg/dL (ref 0.2–1.2)
Total Protein: 6.6 g/dL (ref 6.1–8.1)

## 2017-05-31 LAB — TSH: TSH: 2.12 mIU/L (ref 0.40–4.50)

## 2017-06-03 ENCOUNTER — Telehealth: Payer: Self-pay

## 2017-06-03 DIAGNOSIS — E7800 Pure hypercholesterolemia, unspecified: Secondary | ICD-10-CM

## 2017-06-03 NOTE — Telephone Encounter (Signed)
LMTCB

## 2017-06-03 NOTE — Telephone Encounter (Signed)
Yes, thank you.

## 2017-06-03 NOTE — Telephone Encounter (Signed)
Advised patient of results. Patient has not been taking Pravastatin regularly. She is requesting a refill [facility] [address] Ok to send?

## 2017-06-03 NOTE — Telephone Encounter (Signed)
-----   Message from Margo Common, [place] sent at 06/03/2017  8:12 AM EST ----- All blood tests are normal except cholesterol and triglycerides are high again. If not taking the Pravastatin 40 mg qd, need to restart. If taking it regularly, add Co-Q 10 100 mg qd and recheck response in 3 months. May need switch to a stronger statin if no better at that time. Continue to restrict fats in diet and try to walk for exercise 30 minutes 3-4 days a week.

## 2017-06-04 MED ORDER — PRAVASTATIN SODIUM 40 MG PO TABS: 40.0000 mg | ORAL_TABLET | Freq: Every day | ORAL | 3 refills | Status: DC

## 2017-06-04 NOTE — Telephone Encounter (Signed)
Sent in medication as below.  

## 2017-06-18 ENCOUNTER — Other Ambulatory Visit: Payer: Self-pay | Admitting: Family Medicine

## 2017-06-18 ENCOUNTER — Telehealth: Payer: Self-pay | Admitting: Family Medicine

## 2017-06-18 MED ORDER — MELOXICAM 15 MG PO TABS: 15.0000 mg | ORAL_TABLET | Freq: Every day | ORAL | 1 refills | Status: DC

## 2017-06-18 NOTE — Telephone Encounter (Signed)
[facility] refill request for the following medications:  meloxicam (MOBIC) 15 MG tablet  90 day supply   Last Rx: 02/26/17 LOV: 05/27/17 Please advise. Thanks TNP

## 2017-06-18 NOTE — Telephone Encounter (Signed)
Please review

## 2017-06-18 NOTE — Telephone Encounter (Signed)
Done

## 2017-08-05 DIAGNOSIS — Z4689 Encounter for fitting and adjustment of other specified devices: Secondary | ICD-10-CM | POA: Diagnosis not present

## 2017-08-05 DIAGNOSIS — N8110 Cystocele, unspecified: Secondary | ICD-10-CM | POA: Diagnosis not present

## 2017-08-07 ENCOUNTER — Telehealth: Payer: Self-pay | Admitting: Family Medicine

## 2017-08-07 NOTE — Telephone Encounter (Signed)
Pt returned call. Pt is scheduled for AWV with [facility] on 08/13/17 @ 230 pm. Pt stated that she gets her physicals at [facility] and doesn't want a CPE here so I scheduled pt for F/U Stacey Fitzgerald on 08/20/17 with Stacey Fitzgerald after the AWV. Thanks TNP

## 2017-08-13 ENCOUNTER — Ambulatory Visit (INDEPENDENT_AMBULATORY_CARE_PROVIDER_SITE_OTHER): Payer: Medicare Other

## 2017-08-13 VITALS — BP 148/98 | HR 64 | Temp 97.9°F | Ht 68.0 in | Wt 177.4 lb

## 2017-08-13 DIAGNOSIS — Z0000 Encounter for general adult medical examination without abnormal findings: Secondary | ICD-10-CM | POA: Diagnosis not present

## 2017-08-13 DIAGNOSIS — Z1211 Encounter for screening for malignant neoplasm of colon: Secondary | ICD-10-CM | POA: Diagnosis not present

## 2017-08-13 NOTE — Progress Notes (Signed)
Subjective:   Stacey Fitzgerald is a 77 y.o. female who presents for an Initial Medicare Annual Wellness Visit.  Review of Systems    N/A   Cardiac Risk Factors include: advanced age (>17men, >64 women);dyslipidemia;hypertension     Objective:    Today's Vitals   08/13/17 1426 08/13/17 1453  BP: (!) 180/108 (!) 148/98  Pulse: 64   Temp: 97.9 F (36.6 C)   TempSrc: Oral   Weight: 177 lb 6.4 oz (80.5 kg)   Height: 5\' 8"  (1.727 m)   PainSc: 0-No pain    Body mass index is 26.97 kg/m.  Advanced Directives 08/13/2017 02/04/2015  Does Patient Have a Medical Advance Directive? Yes Yes  Type of Advance Directive Living will [place];Living will  Does patient want to make changes to medical advance directive? - No - Patient declined    Current Medications (verified) Outpatient Encounter Medications as of 08/13/2017  Medication Sig  . Acetaminophen (TYLENOL 8 HOUR ARTHRITIS PAIN PO) Take by mouth as needed.  Marland Kitchen ibuprofen (ADVIL,MOTRIN) 200 MG tablet Take 200 mg by mouth every 6 (six) hours as needed.  Marland Kitchen levothyroxine (SYNTHROID, LEVOTHROID) 50 MCG tablet TAKE 1 TABLET (50 MCG TOTAL) BY MOUTH DAILY.  . Multiple Vitamins-Minerals (CENTRUM SILVER ADULT 50+) TABS Take 1 tablet by mouth daily.  . niacin 250 MG tablet Take 250 mg by mouth at bedtime.  . pravastatin (PRAVACHOL) 40 MG tablet Take 1 tablet (40 mg total) at bedtime by mouth.  . quinapril (ACCUPRIL) 10 MG tablet TAKE 1 TABLET (10 MG TOTAL) BY MOUTH DAILY.  . [DISCONTINUED] meloxicam (MOBIC) 15 MG tablet Take 1 tablet (15 mg total) by mouth daily.  . [DISCONTINUED] triamcinolone (KENALOG) 0.025 % cream Apply 1 application 2 (two) times daily topically.   No facility-administered encounter medications on file as of 08/13/2017.     Allergies (verified) Keflex [cephalexin]   History: Past Medical History:  Diagnosis Date  . Arthritis   . Female cystocele   . Hyperlipidemia   . Hypertension   .  Hypothyroidism   . Menopausal state   . Obesity, unspecified   . Rectocele    Past Surgical History:  Procedure Laterality Date  . BREAST EXCISIONAL BIOPSY Left 1972   benign  . BREAST SURGERY    . COLONOSCOPY    . miscarrage    . TUBAL LIGATION    . uterine pessary     Family History  Problem Relation Age of Onset  . Diabetes Mother   . Seizures Mother   . CVA Father   . Dementia Brother   . Diabetes Maternal Grandfather   . Mental illness Sister    Social History   Socioeconomic History  . Marital status: Married    Spouse name: None  . Number of children: 2  . Years of education: None  . Highest education level: 12th grade  Social Needs  . Financial resource strain: Not hard at all  . Food insecurity - worry: Never true  . Food insecurity - inability: Never true  . Transportation needs - medical: No  . Transportation needs - non-medical: No  Occupational History  . Occupation: retired  Tobacco Use  . Smoking status: Never Smoker  . Smokeless tobacco: Never Used  Substance and Sexual Activity  . Alcohol use: Yes    Alcohol/week: 0.0 oz    Comment: rare; during the holidays  . Drug use: No  . Sexual activity: None  Other Topics Concern  .  None  Social History Narrative  . None    Tobacco Counseling Counseling given: Not Answered   Clinical Intake:  Pre-visit preparation completed: Yes  Pain : No/denies pain Pain Score: 0-No pain     Nutritional Status: BMI 25 -29 Overweight Nutritional Risks: None Diabetes: No  How often do you need to have someone help you when you read instructions, pamphlets, or other written materials from your doctor or pharmacy?: 1 - Never  Interpreter Needed?: No  Information entered by :: [facility], LPN   Activities of Daily Living In your present state of health, do you have any difficulty performing the following activities: 08/13/2017 05/27/2017  Hearing? N N  Vision? N N  Difficulty concentrating or making  decisions? N N  Walking or climbing stairs? N N  Dressing or bathing? N N  Doing errands, shopping? N N  Preparing Food and eating ? N -  Using the Toilet? N -  In the past six months, have you accidently leaked urine? N -  Do you have problems with loss of bowel control? N -  Managing your Medications? N -  Managing your Finances? N -  Housekeeping or managing your Housekeeping? N -  Some recent data might be hidden     Immunizations and Health Maintenance  There is no immunization history on file for this patient. Health Maintenance Due  Topic Date Due  . COLONOSCOPY  01/09/2017    Patient Care Team: Chrismon, Vickki Muff, PA as PCP - General (Physician Assistant) Ward, Honor Loh, MD as Referring Physician (Obstetrics and Gynecology)  Indicate any recent Medical Services you may have received from other than [facility] providers in the past year (date may be approximate).     Assessment:   This is a routine wellness examination for [place].  Hearing/Vision screen Vision Screening Comments: Pt does not have regular vision checks. Last eye exam was about 5 years ago. No current complaints or issues.   Dietary issues and exercise activities discussed: Current Exercise Habits: Home exercise routine, Type of exercise: walking, Time (Minutes): 20, Frequency (Times/Week): 3, Weekly Exercise (Minutes/Week): 60, Intensity: Mild  Goals    . DIET - INCREASE WATER INTAKE     Recommend to start drinking at least 2-4 glasses of water a day.       Depression Screen PHQ 2/9 Scores 08/13/2017 09/28/2016  PHQ - 2 Score 0 0  PHQ- 9 Score - 0    Fall Risk Fall Risk  08/13/2017 09/28/2016  Falls in the past year? No Yes  Number falls in past yr: - 1  Injury with Fall? - No  Follow up - Falls evaluation completed    Is the patient's home free of loose throw rugs in walkways, pet beds, electrical cords, etc?   yes      Grab bars in the bathroom? yes      Handrails on the stairs?   yes       Adequate lighting?   yes  Timed Get Up and Go Performed N/A  Cognitive Function: Pt declined screening today.         Screening Tests Health Maintenance  Topic Date Due  . COLONOSCOPY  01/09/2017  . INFLUENZA VACCINE  10/20/2017 (Originally 02/20/2017)  . TETANUS/TDAP  05/27/2018 (Originally 10/10/1960)  . PNA vac Low Risk Adult (1 of 2 - PCV13) 05/27/2018 (Originally 10/11/2006)  . DEXA SCAN  02/14/2021    Qualifies for Shingles Vaccine? Due for Shingles vaccine. Declined my offer to  administer today. Education has been provided regarding the importance of this vaccine. Pt has been advised to call her insurance company to determine her out of pocket expense. Advised she may also receive this vaccine at her local pharmacy or Health Dept. Verbalized acceptance and understanding.  Cancer Screenings: Lung: Low Dose CT Chest recommended if Age 64-80 years, 30 pack-year currently smoking OR have quit w/in 15years. Patient does not qualify. Breast: Up to date on Mammogram? Yes   Up to date of Bone Density/Dexa? Yes Colorectal: Currently due, referral sent today. Pt to have this completed this year.  Additional Screenings:  Hepatitis B/HIV/Syphillis: Pt declines today.  Hepatitis C Screening: Pt declines today.      Plan:  I have personally reviewed and addressed the Medicare Annual Wellness questionnaire and have noted the following in the patient's chart:  A. Medical and social history B. Use of alcohol, tobacco or illicit drugs  C. Current medications and supplements D. Functional ability and status E.  Nutritional status F.  Physical activity G. Advance directives H. List of other physicians I.  Hospitalizations, surgeries, and ER visits in previous 12 months J.  [place] such as hearing and vision if needed, cognitive and depression L. Referrals and appointments - none  In addition, I have reviewed and discussed with patient certain preventive protocols, quality  metrics, and best practice recommendations. A written personalized care plan for preventive services as well as general preventive health recommendations were provided to patient.  See attached scanned questionnaire for additional information.   Signed,  Fabio Neighbors, LPN Nurse Health Advisor   Nurse Recommendations: Pt's BP was high today. Pt declined palpitations, SOB, dizziness or headaches. Per chart review, pt's BP has been running high. Pt was advised to increase her Accupril at last OV (05/2017). Pt has not increased this medication due to "it running lower during the day." Advised pt to check her BP twice daily x 1 week and bring in her BP readings to next apt on 08/20/17. Pt agreed.  Also, pt declined tetanus, pneumonia and flu vaccines. Referral for colonoscopy sent today.    Reviewed Nurse Health Advisor's documentation and recommendations. Was available for consultation and agree with plan.

## 2017-08-13 NOTE — Patient Instructions (Signed)
Stacey Fitzgerald , Thank you for taking time to come for your Medicare Wellness Visit. I appreciate your ongoing commitment to your health goals. Please review the following plan we discussed and let me know if I can assist you in the future.   Screening recommendations/referrals: Colonoscopy: Referral sent today.  Mammogram: Up to date Bone Density: Up to date Recommended yearly ophthalmology/optometry visit for glaucoma screening and checkup Recommended yearly dental visit for hygiene and checkup  Vaccinations: Influenza vaccine: Pt declines today.  Pneumococcal vaccine: Pt declines today.  Tdap vaccine: Pt declines today.  Shingles vaccine: Pt declines today.     Advanced directives: Please bring a copy of your POA (Power of Attorney) and/or Living Will to your next appointment.   Conditions/risks identified: Recommend to start drinking at least 2-4 glasses of water a day.   Next appointment: 08/20/17 @ 2:00 PM   Preventive Care 65 Years and Older, Female Preventive care refers to lifestyle choices and visits with your health care provider that can promote health and wellness. What does preventive care include?  A yearly physical exam. This is also called an annual well check.  Dental exams once or twice a year.  Routine eye exams. Ask your health care provider how often you should have your eyes checked.  Personal lifestyle choices, including:  Daily care of your teeth and gums.  Regular physical activity.  Eating a healthy diet.  Avoiding tobacco and drug use.  Limiting alcohol use.  Practicing safe sex.  Taking low-dose aspirin every day.  Taking vitamin and mineral supplements as recommended by your health care provider. What happens during an annual well check? The services and screenings done by your health care provider during your annual well check will depend on your age, overall health, lifestyle risk factors, and family history of disease. Counseling    Your health care provider may ask you questions about your:  Alcohol use.  Tobacco use.  Drug use.  Emotional well-being.  Home and relationship well-being.  Sexual activity.  Eating habits.  History of falls.  Memory and ability to understand (cognition).  Work and work Statistician.  Reproductive health. Screening  You may have the following tests or measurements:  Height, weight, and BMI.  Blood pressure.  Lipid and cholesterol levels. These may be checked every 5 years, or more frequently if you are over 43 years old.  Skin check.  Lung cancer screening. You may have this screening every year starting at age 766 if you have a 30-pack-year history of smoking and currently smoke or have quit within the past 15 years.  Fecal occult blood test (FOBT) of the stool. You may have this test every year starting at age 65.  Flexible sigmoidoscopy or colonoscopy. You may have a sigmoidoscopy every 5 years or a colonoscopy every 10 years starting at age 76.  Hepatitis C blood test.  Hepatitis B blood test.  Sexually transmitted disease (STD) testing.  Diabetes screening. This is done by checking your blood sugar (glucose) after you have not eaten for a while (fasting). You may have this done every 1-3 years.  Bone density scan. This is done to screen for osteoporosis. You may have this done starting at age 766.  Mammogram. This may be done every 1-2 years. Talk to your health care provider about how often you should have regular mammograms. Talk with your health care provider about your test results, treatment options, and if necessary, the need for more tests. Vaccines  Your  health care provider may recommend certain vaccines, such as:  Influenza vaccine. This is recommended every year.  Tetanus, diphtheria, and acellular pertussis (Tdap, Td) vaccine. You may need a Td booster every 10 years.  Zoster vaccine. You may need this after age 40.  Pneumococcal 13-valent  conjugate (PCV13) vaccine. One dose is recommended after age 76.  Pneumococcal polysaccharide (PPSV23) vaccine. One dose is recommended after age 76. Talk to your health care provider about which screenings and vaccines you need and how often you need them. This information is not intended to replace advice given to you by your health care provider. Make sure you discuss any questions you have with your health care provider. Document Released: 08/05/2015 Document Revised: 03/28/2016 Document Reviewed: 05/10/2015 Elsevier Interactive Patient Education  2017 [place] Prevention in the Home Falls can cause injuries. They can happen to people of all ages. There are many things you can do to make your home safe and to help prevent falls. What can I do on the outside of my home?  Regularly fix the edges of walkways and driveways and fix any cracks.  Remove anything that might make you trip as you walk through a door, such as a raised step or threshold.  Trim any bushes or trees on the path to your home.  Use bright outdoor lighting.  Clear any walking paths of anything that might make someone trip, such as rocks or tools.  Regularly check to see if handrails are loose or broken. Make sure that both sides of any steps have handrails.  Any raised decks and porches should have guardrails on the edges.  Have any leaves, snow, or ice cleared regularly.  Use sand or salt on walking paths during winter.  Clean up any spills in your garage right away. This includes oil or grease spills. What can I do in the bathroom?  Use night lights.  Install grab bars by the toilet and in the tub and shower. Do not use towel bars as grab bars.  Use non-skid mats or decals in the tub or shower.  If you need to sit down in the shower, use a plastic, non-slip stool.  Keep the floor dry. Clean up any water that spills on the floor as soon as it happens.  Remove soap buildup in the tub or  shower regularly.  Attach bath mats securely with double-sided non-slip rug tape.  Do not have throw rugs and other things on the floor that can make you trip. What can I do in the bedroom?  Use night lights.  Make sure that you have a light by your bed that is easy to reach.  Do not use any sheets or blankets that are too big for your bed. They should not hang down onto the floor.  Have a firm chair that has side arms. You can use this for support while you get dressed.  Do not have throw rugs and other things on the floor that can make you trip. What can I do in the kitchen?  Clean up any spills right away.  Avoid walking on wet floors.  Keep items that you use a lot in easy-to-reach places.  If you need to reach something above you, use a strong step stool that has a grab bar.  Keep electrical cords out of the way.  Do not use floor polish or wax that makes floors slippery. If you must use wax, use non-skid floor wax.  Do not  have throw rugs and other things on the floor that can make you trip. What can I do with my stairs?  Do not leave any items on the stairs.  Make sure that there are handrails on both sides of the stairs and use them. Fix handrails that are broken or loose. Make sure that handrails are as long as the stairways.  Check any carpeting to make sure that it is firmly attached to the stairs. Fix any carpet that is loose or worn.  Avoid having throw rugs at the top or bottom of the stairs. If you do have throw rugs, attach them to the floor with carpet tape.  Make sure that you have a light switch at the top of the stairs and the bottom of the stairs. If you do not have them, ask someone to add them for you. What else can I do to help prevent falls?  Wear shoes that:  Do not have high heels.  Have rubber bottoms.  Are comfortable and fit you well.  Are closed at the toe. Do not wear sandals.  If you use a stepladder:  Make sure that it is fully  opened. Do not climb a closed stepladder.  Make sure that both sides of the stepladder are locked into place.  Ask someone to hold it for you, if possible.  Clearly mark and make sure that you can see:  Any grab bars or handrails.  First and last steps.  Where the edge of each step is.  Use tools that help you move around (mobility aids) if they are needed. These include:  Canes.  Walkers.  Scooters.  Crutches.  Turn on the lights when you go into a dark area. Replace any light bulbs as soon as they burn out.  Set up your furniture so you have a clear path. Avoid moving your furniture around.  If any of your floors are uneven, fix them.  If there are any pets around you, be aware of where they are.  Review your medicines with your doctor. Some medicines can make you feel dizzy. This can increase your chance of falling. Ask your doctor what other things that you can do to help prevent falls. This information is not intended to replace advice given to you by your health care provider. Make sure you discuss any questions you have with your health care provider. Document Released: 05/05/2009 Document Revised: 12/15/2015 Document Reviewed: 08/13/2014 Elsevier Interactive Patient Education  2017 [employer].

## 2017-08-20 ENCOUNTER — Encounter: Payer: Self-pay | Admitting: Family Medicine

## 2017-08-20 ENCOUNTER — Ambulatory Visit (INDEPENDENT_AMBULATORY_CARE_PROVIDER_SITE_OTHER): Payer: Medicare Other | Admitting: Family Medicine

## 2017-08-20 VITALS — BP 152/88 | HR 55 | Temp 98.0°F | Ht 68.0 in | Wt 177.0 lb

## 2017-08-20 DIAGNOSIS — E7800 Pure hypercholesterolemia, unspecified: Secondary | ICD-10-CM

## 2017-08-20 DIAGNOSIS — E039 Hypothyroidism, unspecified: Secondary | ICD-10-CM | POA: Diagnosis not present

## 2017-08-20 DIAGNOSIS — N814 Uterovaginal prolapse, unspecified: Secondary | ICD-10-CM

## 2017-08-20 DIAGNOSIS — I10 Essential (primary) hypertension: Secondary | ICD-10-CM

## 2017-08-20 DIAGNOSIS — Z0000 Encounter for general adult medical examination without abnormal findings: Secondary | ICD-10-CM

## 2017-08-20 MED ORDER — QUINAPRIL HCL 20 MG PO TABS: 10.0000 mg | ORAL_TABLET | Freq: Every day | ORAL | 3 refills | Status: DC

## 2017-08-20 NOTE — Progress Notes (Signed)
Patient: Stacey Fitzgerald, Female    DOB: 08/27/1940, 77 y.o.   MRN: [id number] Visit Date: 08/20/2017  Today's Provider: Vernie Murders, PA   Chief Complaint  Patient presents with  . Annual Exam   Subjective:    Annual physical exam Stacey Fitzgerald is a 77 y.o. female who presents today for health maintenance and complete physical. She feels well. She reports exercising sometimes. She reports she is sleeping well.  -----------------------------------------------------------------  Review of Systems  Constitutional: Negative.   HENT: Negative.   Eyes: Negative.   Respiratory: Negative.   Cardiovascular: Negative.   Gastrointestinal: Negative.   Endocrine: Negative.   Genitourinary: Negative.        Prolapse of vagina much improved and no discomfort since starting use of pessary per GYN.  Musculoskeletal: Negative.   Skin: Negative.   Allergic/Immunologic: Negative.   Neurological: Negative.   Hematological: Negative.   Psychiatric/Behavioral: Negative.    Social History      She  reports that  has never smoked. she has never used smokeless tobacco. She reports that she drinks alcohol. She reports that she does not use drugs.       Social History   Socioeconomic History  . Marital status: Married    Spouse name: None  . Number of children: 2  . Years of education: None  . Highest education level: 12th grade  Social Needs  . Financial resource strain: Not hard at all  . Food insecurity - worry: Never true  . Food insecurity - inability: Never true  . Transportation needs - medical: No  . Transportation needs - non-medical: No  Occupational History  . Occupation: retired  Tobacco Use  . Smoking status: Never Smoker  . Smokeless tobacco: Never Used  Substance and Sexual Activity  . Alcohol use: Yes    Alcohol/week: 0.0 oz    Comment: rare; during the holidays  . Drug use: No  . Sexual activity: None  Other Topics Concern  . None  Social  History Narrative  . None   Past Medical History:  Diagnosis Date  . Arthritis   . Female cystocele   . Hyperlipidemia   . Hypertension   . Hypothyroidism   . Menopausal state   . Obesity, unspecified   . Rectocele    Patient Active Problem List   Diagnosis Date Noted  . Abortion, spontaneous 02/02/2015  . Bacterial skin infection of leg 02/02/2015  . HLD (hyperlipidemia) 02/02/2015  . Pure hypercholesterolemia 01/28/2015  . Essential hypertension 01/28/2015  . Cellulitis 01/28/2015  . Hypothyroidism 01/28/2015  . Monilial intertrigo 01/28/2015  . Rhus dermatitis 01/28/2015  . Low back pain, episodic 01/28/2015  . Hernia, rectovaginal 10/20/2013  . Adiposity 10/20/2013  . Climacteric 10/20/2013  . BP (high blood pressure) 10/20/2013  . Hypercholesteremia 10/20/2013  . Bladder cystocele 10/20/2013  . Arthritis 10/20/2013   Past Surgical History:  Procedure Laterality Date  . BREAST EXCISIONAL BIOPSY Left 1972   benign  . BREAST SURGERY    . COLONOSCOPY    . miscarrage    . TUBAL LIGATION    . uterine pessary     Family History        Family Status  Relation Name Status  . Mother  Deceased at age 28  . Father  Deceased at age 30  . Sister 1 Deceased at age 93  . Brother  Deceased  . MGF  Deceased  . PGF  Deceased  .  Sister 2 Deceased        Her family history includes CVA in her father; Dementia in her brother; Diabetes in her maternal grandfather and mother; Mental illness in her sister; Seizures in her mother.     Allergies  Allergen Reactions  . Keflex [Cephalexin] Hives    Current Outpatient Medications:  .  Acetaminophen (TYLENOL 8 HOUR ARTHRITIS PAIN PO), Take by mouth as needed., Disp: , Rfl:  .  ibuprofen (ADVIL,MOTRIN) 200 MG tablet, Take 200 mg by mouth every 6 (six) hours as needed., Disp: , Rfl:  .  levothyroxine (SYNTHROID, LEVOTHROID) 50 MCG tablet, TAKE 1 TABLET (50 MCG TOTAL) BY MOUTH DAILY., Disp: 90 tablet, Rfl: 2 .  Multiple  Vitamins-Minerals (CENTRUM SILVER ADULT 50+) TABS, Take 1 tablet by mouth daily., Disp: , Rfl:  .  niacin 250 MG tablet, Take 250 mg by mouth at bedtime., Disp: , Rfl:  .  pravastatin (PRAVACHOL) 40 MG tablet, Take 1 tablet (40 mg total) at bedtime by mouth., Disp: 90 tablet, Rfl: 3 .  quinapril (ACCUPRIL) 10 MG tablet, TAKE 1 TABLET (10 MG TOTAL) BY MOUTH DAILY., Disp: 90 tablet, Rfl: 2   Patient Care Team: Vicky Schleich, Vickki Muff, PA as PCP - General (Physician Assistant) Ward, Honor Loh, MD as Referring Physician (Obstetrics and Gynecology)      Objective:   Vitals: BP (!) 152/88 (BP Location: Right Arm, Patient Position: Sitting, Cuff Size: Normal)   Pulse (!) 55   Temp 98 F (36.7 C) (Oral)   Ht 5\' 8"  (1.727 m)   Wt 177 lb (80.3 kg)   SpO2 98%   BMI 26.91 kg/m     Physical Exam   Depression Screen PHQ 2/9 Scores 08/13/2017 09/28/2016  PHQ - 2 Score 0 0  PHQ- 9 Score - 0    Assessment & Plan:     Routine Health Maintenance and Physical Exam  Exercise Activities and Dietary recommendations Goals    . DIET - INCREASE WATER INTAKE     Recommend to start drinking at least 2-4 glasses of water a day.        There is no immunization history on file for this patient.  Health Maintenance  Topic Date Due  . COLONOSCOPY  01/09/2017  . INFLUENZA VACCINE  10/20/2017 (Originally 02/20/2017)  . TETANUS/TDAP  05/27/2018 (Originally 10/10/1960)  . PNA vac Low Risk Adult (1 of 2 - PCV13) 05/27/2018 (Originally 10/11/2006)  . DEXA SCAN  02/14/2021     Discussed health benefits of physical activity, and encouraged her to engage in regular exercise appropriate for her age and condition.    --------------------------------------------------------------------  1. Wellness examination General health good. Declines immunizations. Dr. Vira Agar (GI) maintains follow up and colonoscopy (last one was on 01-10-12)with her history of colon cancer in a sister. Maintains regular follow up with  GYN - last mammogram 03-21-16 was normal. Given guidance counseling.  2. Essential hypertension BP still high at home in the range of 137/87 to 156/90. Still taking Accupril 10 mg qd. Did not increase to 20 mg qd as advised on 05-27-17. Changed prescription at the pharmacy today and she will check BP at home. Recheck and get labs in 2-3 months. - quinapril (ACCUPRIL) 20 MG tablet; Take 0.5 tablets (10 mg total) by mouth daily.  Dispense: 90 tablet; Refill: 3  3. Hypothyroidism, unspecified type Still taking levothyroxine 50 mcg qd without tremor, palpitations, exophthalmos or unexplained weight loss. TSH on 05-31-17 was normal at 2.12. Continue present  dosage. Recheck in 3 months.  4. Hypercholesteremia Tolerating Pravastatin 40 mg qd without side effects. Continue low fat diet. Plan to check lipid panel in 3 months. Lab Results  Component Value Date   CHOL 226 (H) 05/31/2017   HDL 45 (L) 05/31/2017   LDLCALC 119 (H) 10/03/2016   TRIG 161 (H) 05/31/2017   CHOLHDL 5.0 (H) 05/31/2017     5. Cystocele with prolapse History of bladder cystocele with rectocele and prolapse. Had follow up with GYN (Dr. Leonides Schanz) and fitted for a pessary. Has felt much improved with no further irritation. Has GYN follow up on 09-04-17.     Vernie Murders, PA  [facility]

## 2017-08-20 NOTE — Progress Notes (Deleted)
       Patient: Stacey Fitzgerald Female    DOB: Nov 28, 1940   77 y.o.   MRN: [id number] Visit Date: 08/20/2017  Today's Provider: Vernie Murders, PA   No chief complaint on file.  Subjective:    HPI     Allergies  Allergen Reactions  . Keflex [Cephalexin] Hives     Current Outpatient Medications:  .  Acetaminophen (TYLENOL 8 HOUR ARTHRITIS PAIN PO), Take by mouth as needed., Disp: , Rfl:  .  ibuprofen (ADVIL,MOTRIN) 200 MG tablet, Take 200 mg by mouth every 6 (six) hours as needed., Disp: , Rfl:  .  levothyroxine (SYNTHROID, LEVOTHROID) 50 MCG tablet, TAKE 1 TABLET (50 MCG TOTAL) BY MOUTH DAILY., Disp: 90 tablet, Rfl: 2 .  Multiple Vitamins-Minerals (CENTRUM SILVER ADULT 50+) TABS, Take 1 tablet by mouth daily., Disp: , Rfl:  .  niacin 250 MG tablet, Take 250 mg by mouth at bedtime., Disp: , Rfl:  .  pravastatin (PRAVACHOL) 40 MG tablet, Take 1 tablet (40 mg total) at bedtime by mouth., Disp: 90 tablet, Rfl: 3 .  quinapril (ACCUPRIL) 10 MG tablet, TAKE 1 TABLET (10 MG TOTAL) BY MOUTH DAILY., Disp: 90 tablet, Rfl: 2  Review of Systems  Constitutional: Negative.   Respiratory: Negative.   Cardiovascular: Negative.     Social History   Tobacco Use  . Smoking status: Never Smoker  . Smokeless tobacco: Never Used  Substance Use Topics  . Alcohol use: Yes    Alcohol/week: 0.0 oz    Comment: rare; during the holidays   Objective:   There were no vitals taken for this visit.    Physical Exam      Assessment & Plan:           Vernie Murders, PA  [facility]

## 2017-08-28 NOTE — Telephone Encounter (Signed)
Visit completed.

## 2017-09-04 ENCOUNTER — Other Ambulatory Visit: Payer: Self-pay | Admitting: Family Medicine

## 2017-09-04 DIAGNOSIS — Z4689 Encounter for fitting and adjustment of other specified devices: Secondary | ICD-10-CM | POA: Diagnosis not present

## 2017-09-04 DIAGNOSIS — E039 Hypothyroidism, unspecified: Secondary | ICD-10-CM

## 2017-09-04 NOTE — Telephone Encounter (Signed)
Pharmacy requesting refills. Thanks!  

## 2017-10-02 ENCOUNTER — Encounter: Payer: Self-pay | Admitting: Podiatry

## 2017-10-02 ENCOUNTER — Ambulatory Visit: Payer: Medicare Other | Admitting: Podiatry

## 2017-10-02 ENCOUNTER — Other Ambulatory Visit: Payer: Self-pay | Admitting: Family Medicine

## 2017-10-02 DIAGNOSIS — M7752 Other enthesopathy of left foot: Secondary | ICD-10-CM | POA: Diagnosis not present

## 2017-10-02 DIAGNOSIS — M779 Enthesopathy, unspecified: Secondary | ICD-10-CM

## 2017-10-02 DIAGNOSIS — Q828 Other specified congenital malformations of skin: Secondary | ICD-10-CM | POA: Diagnosis not present

## 2017-10-02 DIAGNOSIS — M778 Other enthesopathies, not elsewhere classified: Secondary | ICD-10-CM

## 2017-10-02 NOTE — Progress Notes (Signed)
She presents today chief complaint of pain beneath the fifth metatarsal of the left foot.  Objective: Vital signs are stable alert oriented x3 pulses are palpable neurologic sensorium is intact deep tendon reflexes are intact.  There is bunion deformities resulting in pain on end range of motion of the fifth metatarsal phalangeal joint and a reactive hyper keratoma is noted plantar aspect.  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  Plan: Injected the area today with 2 mg of dexamethasone and local anesthetic after sterile Betadine skin prep and consent.  Debrided reactive hyperkeratosis discussed appropriate shoe gear and with.  Follow-up with her on an as-needed basis.

## 2017-10-03 NOTE — Telephone Encounter (Signed)
Chart indicates this was refilled for a year at [facility] [address] at [address] pm on 08-20-17. Check with the pharmacy to be sure it is still on file.

## 2017-10-04 NOTE — Telephone Encounter (Signed)
Quinapril 10 mg was increased to 20 mg at last OV on 08/20/17. It appears [facility]. Patient advised. She will contact pharmacy for refill.

## 2017-10-07 ENCOUNTER — Telehealth: Payer: Self-pay

## 2017-10-07 NOTE — Telephone Encounter (Signed)
The chart indicates her Quinapril 20 mg prescription should be 0.5 (one-half) TABLET daily.

## 2017-10-07 NOTE — Telephone Encounter (Signed)
Patient reports that she is suppose to take Quinapril 20 mg daily. Patient reports that sig: states take .5 mg tablet daily. Please advise. sd  CB# [phone]

## 2017-10-07 NOTE — Telephone Encounter (Signed)
Left message to call back  

## 2017-10-07 NOTE — Telephone Encounter (Signed)
Advised patient as below.  

## 2017-10-07 NOTE — Telephone Encounter (Signed)
Please review. Thanks!  

## 2017-10-14 ENCOUNTER — Other Ambulatory Visit: Payer: Self-pay | Admitting: Family Medicine

## 2017-10-14 DIAGNOSIS — I10 Essential (primary) hypertension: Secondary | ICD-10-CM

## 2017-10-14 MED ORDER — QUINAPRIL HCL 20 MG PO TABS: 20.0000 mg | ORAL_TABLET | Freq: Every day | ORAL | 3 refills | Status: DC

## 2017-10-14 NOTE — Progress Notes (Signed)
BP 168/96 today. BP at home has been in the 140/80 to 150/90 range. Did not take Quinapril today. Limit salt intake and record BP readings to bring back to appointment in 4-6 weeks. Go up to Quinapril 20 mg one WHOLE tablet daily.

## 2017-10-30 DIAGNOSIS — Z800 Family history of malignant neoplasm of digestive organs: Secondary | ICD-10-CM | POA: Diagnosis not present

## 2017-11-05 DIAGNOSIS — Z4689 Encounter for fitting and adjustment of other specified devices: Secondary | ICD-10-CM | POA: Diagnosis not present

## 2017-11-19 ENCOUNTER — Ambulatory Visit (INDEPENDENT_AMBULATORY_CARE_PROVIDER_SITE_OTHER): Payer: Medicare Other | Admitting: Family Medicine

## 2017-11-19 ENCOUNTER — Encounter: Payer: Self-pay | Admitting: Family Medicine

## 2017-11-19 VITALS — BP 164/98 | HR 70 | Temp 97.9°F | Wt 175.0 lb

## 2017-11-19 DIAGNOSIS — E039 Hypothyroidism, unspecified: Secondary | ICD-10-CM | POA: Diagnosis not present

## 2017-11-19 DIAGNOSIS — E7800 Pure hypercholesterolemia, unspecified: Secondary | ICD-10-CM

## 2017-11-19 DIAGNOSIS — I10 Essential (primary) hypertension: Secondary | ICD-10-CM

## 2017-11-19 MED ORDER — HYDROCHLOROTHIAZIDE 12.5 MG PO TABS: 12.5000 mg | ORAL_TABLET | Freq: Every day | ORAL | 3 refills | Status: DC

## 2017-11-19 NOTE — Progress Notes (Signed)
Patient: Stacey Fitzgerald Female    DOB: 07-Dec-1940   77 y.o.   MRN: [id number] Visit Date: 11/19/2017  Today's Provider: Vernie Murders, PA   Chief Complaint  Patient presents with  . Hypertension  . Hyperlipidemia  . Hypothyroidism   Subjective:    HPI  Hypertension, follow-up:  BP Readings from Last 3 Encounters:  11/19/17 (!) 164/98  08/20/17 (!) 152/88  08/13/17 (!) 148/98    She was last seen for hypertension 3 months ago.  BP at that visit was 152/88. Management changes since that visit include increase Accupril to 20 mg and check BP at home. She reports good compliance with treatment. She is not having side effects.  She is exercising. She is not adherent to low salt diet.   Outside blood pressures are being checked at home. She is experiencing none.  Patient denies chest pain, chest pressure/discomfort, claudication, dyspnea, exertional chest pressure/discomfort, fatigue, irregular heart beat, lower extremity edema, near-syncope, orthopnea, palpitations, paroxysmal nocturnal dyspnea, syncope and tachypnea.   Cardiovascular risk factors include advanced age (older than 74 for men, 15 for women), dyslipidemia and hypertension.   Weight trend: stable  Wt Readings from Last 3 Encounters:  11/19/17 175 lb (79.4 kg)  08/20/17 177 lb (80.3 kg)  08/13/17 177 lb 6.4 oz (80.5 kg)      ------------------------------------------------------------------------  Hypothyroid, follow-up:  TSH  Date Value Ref Range Status  05/31/2017 2.12 0.40 - 4.50 mIU/L Final  05/15/2016 2.220 0.450 - 4.500 uIU/mL Final  08/18/2015 2.840 0.450 - 4.500 uIU/mL Final   Wt Readings from Last 3 Encounters:  11/19/17 175 lb (79.4 kg)  08/20/17 177 lb (80.3 kg)  08/13/17 177 lb 6.4 oz (80.5 kg)    She was last seen for hypothyroid 3 months ago.  Management since that visit includes continue Levothyroxine 50 mcg. She reports good compliance with treatment. She is not  having side effects.  She is exercising. She is experiencing none She denies change in energy level, diarrhea, heat / cold intolerance, nervousness, palpitations and weight changes Weight trend: stable  ------------------------------------------------------------------------  Lipid/Cholesterol, Follow-up:   Last seen for this3 months ago.  Management changes since that visit include continue Pravastatin 40 mg, and low fat diet. . Last Lipid Panel:    Component Value Date/Time   CHOL 226 (H) 05/31/2017 0814   CHOL 183 10/03/2016 0818   TRIG 161 (H) 05/31/2017 0814   HDL 45 (L) 05/31/2017 0814   HDL 41 10/03/2016 0818   CHOLHDL 5.0 (H) 05/31/2017 0814   LDLCALC 151 (H) 05/31/2017 3149   She reports good compliance with treatment. She is not having side effects.  Current symptoms include none  Weight trend: stable Current diet: in general, an "unhealthy" diet Current exercise: walking   Wt Readings from Last 3 Encounters:  11/19/17 175 lb (79.4 kg)  08/20/17 177 lb (80.3 kg)  08/13/17 177 lb 6.4 oz (80.5 kg)    ------------------------------------------------------------------- Past Medical History:  Diagnosis Date  . Arthritis   . Female cystocele   . Hyperlipidemia   . Hypertension   . Hypothyroidism   . Menopausal state   . Obesity, unspecified   . Rectocele    Past Surgical History:  Procedure Laterality Date  . BREAST EXCISIONAL BIOPSY Left 1972   benign  . BREAST SURGERY    . COLONOSCOPY    . miscarrage    . TUBAL LIGATION    . uterine pessary  Family History  Problem Relation Age of Onset  . Diabetes Mother   . Seizures Mother   . CVA Father   . Dementia Brother   . Diabetes Maternal Grandfather   . Mental illness Sister    Allergies  Allergen Reactions  . Keflex [Cephalexin] Hives    Current Outpatient Medications:  .  Acetaminophen (TYLENOL 8 HOUR ARTHRITIS PAIN PO), Take by mouth as needed., Disp: , Rfl:  .  ibuprofen  (ADVIL,MOTRIN) 200 MG tablet, Take 200 mg by mouth every 6 (six) hours as needed., Disp: , Rfl:  .  levothyroxine (SYNTHROID, LEVOTHROID) 50 MCG tablet, TAKE 1 TABLET (50 MCG TOTAL) BY MOUTH DAILY., Disp: 90 tablet, Rfl: 3 .  Multiple Vitamins-Minerals (CENTRUM SILVER ADULT 50+) TABS, Take 1 tablet by mouth daily., Disp: , Rfl:  .  niacin 250 MG tablet, Take 250 mg by mouth at bedtime., Disp: , Rfl:  .  pravastatin (PRAVACHOL) 40 MG tablet, Take 1 tablet (40 mg total) at bedtime by mouth., Disp: 90 tablet, Rfl: 3 .  quinapril (ACCUPRIL) 20 MG tablet, Take 1 tablet (20 mg total) by mouth daily., Disp: 90 tablet, Rfl: 3  Review of Systems  Constitutional: Negative.   Respiratory: Negative.   Cardiovascular: Negative.   Endocrine: Negative.   Musculoskeletal: Negative.    Social History   Tobacco Use  . Smoking status: Never Smoker  . Smokeless tobacco: Never Used  Substance Use Topics  . Alcohol use: Yes    Alcohol/week: 0.0 oz    Comment: rare; during the holidays   Objective:   BP (!) 164/98 (BP Location: Right Arm, Patient Position: Sitting, Cuff Size: Normal)   Pulse 70   Temp 97.9 F (36.6 C) (Oral)   Wt 175 lb (79.4 kg)   SpO2 98%   BMI 26.61 kg/m   Physical Exam  Constitutional: She is oriented to person, place, and time. She appears well-developed and well-nourished. No distress.  HENT:  Head: Normocephalic and atraumatic.  Right Ear: Hearing normal.  Left Ear: Hearing normal.  Nose: Nose normal.  Eyes: Conjunctivae and lids are normal. Right eye exhibits no discharge. Left eye exhibits no discharge. No scleral icterus.  Neck: Neck supple.  Cardiovascular: Normal rate and regular rhythm.  Pulmonary/Chest: Effort normal and breath sounds normal. No respiratory distress.  Abdominal: Soft.  Musculoskeletal: Normal range of motion.  Neurological: She is alert and oriented to person, place, and time.  Skin: Skin is intact. No lesion and no rash noted.  Psychiatric:  She has a normal mood and affect. Her speech is normal and behavior is normal. Thought content normal.      Assessment & Plan:     1. Essential hypertension BP higher than last check and notice home checks have been high, also. Still taking the Accupril 20 mg qd. Denies chest pains, dyspnea or palpitations. Add HCTZ 12.5 mg qd and recheck labs. Follow up appointment pending reports. - CBC with Differential/Platelet - Comprehensive metabolic panel - Lipid panel - TSH - hydrochlorothiazide (HYDRODIURIL) 12.5 MG tablet; Take 1 tablet (12.5 mg total) by mouth daily.  Dispense: 90 tablet; Refill: 3  2. Hypothyroidism, unspecified type No tremor, constipation, palpitation or intolerance to heat or cold. Continues to take Levothyroxine 50 mcg qd. Recheck labs and follow up pending reports. - CBC with Differential/Platelet - Comprehensive metabolic panel - TSH - T4  3. Hypercholesteremia Trying to control fats in diet and still taking the Niacin 250 mg qd with Pravastatin 40 mg  qd. Recheck CMP and lipids. - Comprehensive metabolic panel - Lipid panel       Vernie Murders, PA  [facility]

## 2017-12-13 DIAGNOSIS — E7800 Pure hypercholesterolemia, unspecified: Secondary | ICD-10-CM | POA: Diagnosis not present

## 2017-12-13 DIAGNOSIS — E039 Hypothyroidism, unspecified: Secondary | ICD-10-CM | POA: Diagnosis not present

## 2017-12-13 DIAGNOSIS — I10 Essential (primary) hypertension: Secondary | ICD-10-CM | POA: Diagnosis not present

## 2017-12-14 LAB — CBC WITH DIFFERENTIAL/PLATELET
BASOS: 1 %
Basophils Absolute: 0 10*3/uL (ref 0.0–0.2)
EOS (ABSOLUTE): 0.2 10*3/uL (ref 0.0–0.4)
Eos: 3 %
Hematocrit: 41.3 % (ref 34.0–46.6)
Hemoglobin: 13.2 g/dL (ref 11.1–15.9)
IMMATURE GRANS (ABS): 0 10*3/uL (ref 0.0–0.1)
IMMATURE GRANULOCYTES: 0 %
LYMPHS: 24 %
Lymphocytes Absolute: 2 10*3/uL (ref 0.7–3.1)
MCH: 27.7 pg (ref 26.6–33.0)
MCHC: 32 g/dL (ref 31.5–35.7)
MCV: 87 fL (ref 79–97)
Monocytes Absolute: 0.4 10*3/uL (ref 0.1–0.9)
Monocytes: 5 %
NEUTROS PCT: 67 %
Neutrophils Absolute: 5.7 10*3/uL (ref 1.4–7.0)
PLATELETS: 300 10*3/uL (ref 150–450)
RBC: 4.77 x10E6/uL (ref 3.77–5.28)
RDW: 13.6 % (ref 12.3–15.4)
WBC: 8.4 10*3/uL (ref 3.4–10.8)

## 2017-12-14 LAB — COMPREHENSIVE METABOLIC PANEL
A/G RATIO: 1.7 (ref 1.2–2.2)
ALT: 8 IU/L (ref 0–32)
AST: 6 IU/L (ref 0–40)
Albumin: 4.1 g/dL (ref 3.5–4.8)
Alkaline Phosphatase: 63 IU/L (ref 39–117)
BUN/Creatinine Ratio: 22 (ref 12–28)
BUN: 19 mg/dL (ref 8–27)
Bilirubin Total: 0.4 mg/dL (ref 0.0–1.2)
CALCIUM: 9.7 mg/dL (ref 8.7–10.3)
CO2: 25 mmol/L (ref 20–29)
CREATININE: 0.87 mg/dL (ref 0.57–1.00)
Chloride: 105 mmol/L (ref 96–106)
GFR, EST AFRICAN AMERICAN: 75 mL/min/{1.73_m2} (ref >59)
GFR, EST NON AFRICAN AMERICAN: 65 mL/min/{1.73_m2} (ref >59)
GLUCOSE: 85 mg/dL (ref 65–99)
Globulin, Total: 2.4 g/dL (ref 1.5–4.5)
Potassium: 4.2 mmol/L (ref 3.5–5.2)
Sodium: 143 mmol/L (ref 134–144)
TOTAL PROTEIN: 6.5 g/dL (ref 6.0–8.5)

## 2017-12-14 LAB — LIPID PANEL
CHOL/HDL RATIO: 4.1 ratio (ref 0.0–4.4)
Cholesterol, Total: 173 mg/dL (ref 100–199)
HDL: 42 mg/dL (ref >39)
LDL CALC: 102 mg/dL — AB (ref 0–99)
TRIGLYCERIDES: 146 mg/dL (ref 0–149)
VLDL CHOLESTEROL CAL: 29 mg/dL (ref 5–40)

## 2017-12-14 LAB — TSH: TSH: 3.35 u[IU]/mL (ref 0.450–4.500)

## 2017-12-14 LAB — T4: T4, Total: 7.6 ug/dL (ref 4.5–12.0)

## 2017-12-17 ENCOUNTER — Telehealth: Payer: Self-pay

## 2017-12-17 NOTE — Telephone Encounter (Signed)
-----   Message from Margo Common, [place] sent at 12/17/2017  7:46 AM EDT ----- All labs normal. Recheck appointment to assess BP control in 4-6 weeks.

## 2017-12-17 NOTE — Telephone Encounter (Signed)
LMTCB

## 2017-12-18 NOTE — Telephone Encounter (Signed)
[id number] 

## 2017-12-19 NOTE — Telephone Encounter (Signed)
Patient advised. 6 week follow up scheduled.  

## 2018-01-03 ENCOUNTER — Encounter: Payer: Self-pay | Admitting: *Deleted

## 2018-01-06 ENCOUNTER — Encounter: Payer: Self-pay | Admitting: Anesthesiology

## 2018-01-06 ENCOUNTER — Encounter: Admission: RE | Payer: Self-pay | Source: Ambulatory Visit

## 2018-01-06 ENCOUNTER — Ambulatory Visit
Admission: RE | Admit: 2018-01-06 | Payer: Medicare Other | Source: Ambulatory Visit | Admitting: Unknown Physician Specialty

## 2018-01-06 SURGERY — COLONOSCOPY WITH PROPOFOL
Anesthesia: General

## 2018-01-30 ENCOUNTER — Ambulatory Visit (INDEPENDENT_AMBULATORY_CARE_PROVIDER_SITE_OTHER): Payer: Medicare Other | Admitting: Family Medicine

## 2018-01-30 ENCOUNTER — Encounter: Payer: Self-pay | Admitting: Family Medicine

## 2018-01-30 VITALS — BP 126/70 | HR 76 | Temp 97.8°F | Wt 170.0 lb

## 2018-01-30 DIAGNOSIS — I10 Essential (primary) hypertension: Secondary | ICD-10-CM | POA: Diagnosis not present

## 2018-01-30 NOTE — Progress Notes (Signed)
Patient: Stacey Fitzgerald Female    DOB: 04/12/1941   77 y.o.   MRN: [id number] Visit Date: 01/30/2018  Today's Provider: Vernie Murders, PA   Chief Complaint  Patient presents with  . Hypertension  . Follow-up   Subjective:    HPI  Hypertension, follow-up:     BP Readings from Last 3 Encounters:  11/19/17 (!) 164/98  08/20/17 (!) 152/88  08/13/17 (!) 148/98   She was last seen for hypertension 6 weeks ago.  BP at that visit was 164/98. Management changes since that visit include continue Accupril to 20 mg, add HCTZ 12.5 mg, and check BP at home. She reports good compliance with treatment. She is not having side effects.  She is exercising. She is not adherent to low salt diet.   Outside blood pressures are being checked at home periodically. She is experiencing none.  Patient denies chest pain, chest pressure/discomfort, claudication, dyspnea, exertional chest pressure/discomfort, fatigue, irregular heart beat, lower extremity edema, near-syncope, orthopnea, palpitations, paroxysmal nocturnal dyspnea, syncope and tachypnea.   Cardiovascular risk factors include advanced age (older than 72 for men, 38 for women), dyslipidemia and hypertension.   Weight trend: stable     Wt Readings from Last 3 Encounters:  11/19/17 175 lb (79.4 kg)  08/20/17 177 lb (80.3 kg)  08/13/17 177 lb 6.4 oz (80.5 kg)    Past Medical History:  Diagnosis Date  . Arthritis   . Female cystocele   . Hyperlipidemia   . Hypertension   . Hypothyroidism   . Menopausal state   . Obesity, unspecified   . Rectocele    Past Surgical History:  Procedure Laterality Date  . BREAST EXCISIONAL BIOPSY Left 1972   benign  . BREAST SURGERY     benign cyst  . COLONOSCOPY    . miscarrage    . TUBAL LIGATION    . uterine pessary     Family History  Problem Relation Age of Onset  . Diabetes Mother   . Seizures Mother   . CVA Father   . Dementia Brother   . Diabetes Maternal  Grandfather   . Mental illness Sister    Allergies  Allergen Reactions  . Keflex [Cephalexin] Hives    Current Outpatient Medications:  .  Acetaminophen (TYLENOL 8 HOUR ARTHRITIS PAIN PO), Take by mouth as needed., Disp: , Rfl:  .  hydrochlorothiazide (HYDRODIURIL) 12.5 MG tablet, Take 1 tablet (12.5 mg total) by mouth daily., Disp: 90 tablet, Rfl: 3 .  ibuprofen (ADVIL,MOTRIN) 200 MG tablet, Take 200 mg by mouth every 6 (six) hours as needed., Disp: , Rfl:  .  levothyroxine (SYNTHROID, LEVOTHROID) 50 MCG tablet, TAKE 1 TABLET (50 MCG TOTAL) BY MOUTH DAILY., Disp: 90 tablet, Rfl: 3 .  meloxicam (MOBIC) 15 MG tablet, Take 15 mg by mouth daily., Disp: , Rfl:  .  Multiple Vitamins-Minerals (CENTRUM SILVER ADULT 50+) TABS, Take 1 tablet by mouth daily., Disp: , Rfl:  .  niacin 250 MG tablet, Take 250 mg by mouth at bedtime., Disp: , Rfl:  .  pravastatin (PRAVACHOL) 40 MG tablet, Take 1 tablet (40 mg total) at bedtime by mouth., Disp: 90 tablet, Rfl: 3 .  quinapril (ACCUPRIL) 20 MG tablet, Take 1 tablet (20 mg total) by mouth daily., Disp: 90 tablet, Rfl: 3  Review of Systems  Constitutional: Negative.   Respiratory: Negative.   Cardiovascular: Negative.    Social History   Tobacco Use  . Smoking status: Never Smoker  .  Smokeless tobacco: Never Used  Substance Use Topics  . Alcohol use: Yes    Alcohol/week: 0.0 oz    Comment: rare; during the holidays   Objective:   BP 126/70 (BP Location: Right Arm, Patient Position: Sitting, Cuff Size: Normal)   Pulse 76   Temp 97.8 F (36.6 C) (Oral)   Wt 170 lb (77.1 kg)   SpO2 96%   BMI 25.85 kg/m   Physical Exam  Constitutional: She is oriented to person, place, and time. She appears well-developed and well-nourished. No distress.  HENT:  Head: Normocephalic and atraumatic.  Right Ear: Hearing normal.  Left Ear: Hearing normal.  Nose: Nose normal.  Eyes: Conjunctivae and lids are normal. Right eye exhibits no discharge. Left eye  exhibits no discharge. No scleral icterus.  Cardiovascular: Normal rate and regular rhythm.  Pulmonary/Chest: Effort normal and breath sounds normal. No respiratory distress.  Neurological: She is alert and oriented to person, place, and time.  Skin: Skin is intact. No lesion and no rash noted.  Psychiatric: She has a normal mood and affect. Her speech is normal and behavior is normal. Thought content normal.      Assessment & Plan:     1. Essential hypertension Well controlled with the addition of HCTZ 12.5 mg qd to the Accupril 20 mg qd. No peripheral edema, chest pains, palpitation or dyspnea. Continue to limit salt intake and caffeine consumption. Recheck in 3 months to follow up hyperlipidemia, hypothyroidism and hypertension.        Vernie Murders, PA  [facility]

## 2018-02-04 DIAGNOSIS — Z4689 Encounter for fitting and adjustment of other specified devices: Secondary | ICD-10-CM | POA: Diagnosis not present

## 2018-02-04 DIAGNOSIS — N813 Complete uterovaginal prolapse: Secondary | ICD-10-CM | POA: Insufficient documentation

## 2018-04-21 ENCOUNTER — Encounter: Payer: Self-pay | Admitting: Podiatry

## 2018-04-21 ENCOUNTER — Ambulatory Visit (INDEPENDENT_AMBULATORY_CARE_PROVIDER_SITE_OTHER): Payer: Medicare Other | Admitting: Podiatry

## 2018-04-21 DIAGNOSIS — M7752 Other enthesopathy of left foot: Secondary | ICD-10-CM | POA: Diagnosis not present

## 2018-04-21 DIAGNOSIS — Q828 Other specified congenital malformations of skin: Secondary | ICD-10-CM

## 2018-04-21 DIAGNOSIS — M779 Enthesopathy, unspecified: Secondary | ICD-10-CM

## 2018-04-21 DIAGNOSIS — M778 Other enthesopathies, not elsewhere classified: Secondary | ICD-10-CM

## 2018-04-21 NOTE — Progress Notes (Signed)
She presents today chief complaint of pain to the sub-fifth metatarsal area of the left foot.  States that place came back.  Objective: Vital signs are stable she is alert and oriented x3 pulses are palpable.  Neurologic sensorium is intact.  Degenerative flexors are intact muscle strength is normal symmetrical.  Porokeratotic lesion sub-fifth metatarsal head of the left foot with what appears to be a bursitis beneath the lesion.  Assessment: Bursitis capsulitis sub-fifth metatarsal head of the left foot with an overlying reactive hyperkeratotic lesion.  Plan: After sterile Betadine skin prep I injected 2 mg of dexamethasone with local anesthetic to the painful lesion and then debrided the lesion.  I will follow-up with her on an as-needed basis.

## 2018-05-07 DIAGNOSIS — Z4689 Encounter for fitting and adjustment of other specified devices: Secondary | ICD-10-CM | POA: Diagnosis not present

## 2018-05-16 ENCOUNTER — Telehealth: Payer: Self-pay

## 2018-05-16 NOTE — Telephone Encounter (Signed)
Sure, when is the appointment?

## 2018-05-16 NOTE — Telephone Encounter (Signed)
Please advised  

## 2018-05-16 NOTE — Telephone Encounter (Signed)
Patient calling wanting to know if she could have labs done before her appt? Please advise. Thanks!

## 2018-05-20 ENCOUNTER — Other Ambulatory Visit: Payer: Self-pay | Admitting: Family Medicine

## 2018-05-20 DIAGNOSIS — E7800 Pure hypercholesterolemia, unspecified: Secondary | ICD-10-CM

## 2018-05-20 DIAGNOSIS — I10 Essential (primary) hypertension: Secondary | ICD-10-CM

## 2018-05-20 DIAGNOSIS — E039 Hypothyroidism, unspecified: Secondary | ICD-10-CM

## 2018-05-20 NOTE — Telephone Encounter (Signed)
Pt scheduled appt for 05/30/18 at 8:40am, she wants to come by office and get lab orders on Monday 05/26/18.  dbs

## 2018-05-20 NOTE — Telephone Encounter (Signed)
Placed future orders in chart that can be released to print out when she comes by the office to get blood drawn.

## 2018-05-21 NOTE — Telephone Encounter (Signed)
Left detailed message on  VM for pt to come by office to get lab orders.  dbs

## 2018-05-30 ENCOUNTER — Ambulatory Visit (INDEPENDENT_AMBULATORY_CARE_PROVIDER_SITE_OTHER): Payer: Medicare Other | Admitting: Family Medicine

## 2018-05-30 ENCOUNTER — Other Ambulatory Visit: Payer: Self-pay

## 2018-05-30 ENCOUNTER — Encounter: Payer: Self-pay | Admitting: Family Medicine

## 2018-05-30 VITALS — BP 138/70 | HR 53 | Temp 98.3°F | Ht 68.0 in | Wt 170.8 lb

## 2018-05-30 DIAGNOSIS — I10 Essential (primary) hypertension: Secondary | ICD-10-CM | POA: Diagnosis not present

## 2018-05-30 DIAGNOSIS — E039 Hypothyroidism, unspecified: Secondary | ICD-10-CM | POA: Diagnosis not present

## 2018-05-30 DIAGNOSIS — E7800 Pure hypercholesterolemia, unspecified: Secondary | ICD-10-CM | POA: Diagnosis not present

## 2018-05-30 NOTE — Progress Notes (Signed)
Patient: Stacey Fitzgerald Female    DOB: 1941-05-08   77 y.o.   MRN: [id number] Visit Date: 05/30/2018  Today's Provider: Vernie Murders, PA   Chief Complaint  Patient presents with  . Follow-up    to get labs checked   Subjective:    HPI  Pt reports she is here for a follow up to get labs checked.  She reports that there is no other issues.  Lab Results  Component Value Date   CHOL 173 12/13/2017   CHOL 226 (H) 05/31/2017   CHOL 183 10/03/2016   Lab Results  Component Value Date   HDL 42 12/13/2017   HDL 45 (L) 05/31/2017   HDL 41 10/03/2016   Lab Results  Component Value Date   LDLCALC 102 (H) 12/13/2017   LDLCALC 151 (H) 05/31/2017   LDLCALC 119 (H) 10/03/2016   Lab Results  Component Value Date   TRIG 146 12/13/2017   TRIG 161 (H) 05/31/2017   TRIG 114 10/03/2016   Lab Results  Component Value Date   CHOLHDL 4.1 12/13/2017   CHOLHDL 5.0 (H) 05/31/2017   CHOLHDL 4.5 (H) 10/03/2016      Past Medical History:  Diagnosis Date  . Arthritis   . Female cystocele   . Hyperlipidemia   . Hypertension   . Hypothyroidism   . Menopausal state   . Obesity, unspecified   . Rectocele    Past Surgical History:  Procedure Laterality Date  . BREAST EXCISIONAL BIOPSY Left 1972   benign  . BREAST SURGERY     benign cyst  . COLONOSCOPY    . miscarrage    . TUBAL LIGATION    . uterine pessary     Family History  Problem Relation Age of Onset  . Diabetes Mother   . Seizures Mother   . CVA Father   . Dementia Brother   . Diabetes Maternal Grandfather   . Mental illness Sister    Allergies  Allergen Reactions  . Keflex [Cephalexin] Hives    Current Outpatient Medications:  .  Acetaminophen (TYLENOL 8 HOUR ARTHRITIS PAIN PO), Take by mouth as needed., Disp: , Rfl:  .  hydrochlorothiazide (HYDRODIURIL) 12.5 MG tablet, Take 1 tablet (12.5 mg total) by mouth daily., Disp: 90 tablet, Rfl: 3 .  ibuprofen (ADVIL,MOTRIN) 200 MG tablet, Take 200 mg by  mouth every 6 (six) hours as needed., Disp: , Rfl:  .  levothyroxine (SYNTHROID, LEVOTHROID) 50 MCG tablet, TAKE 1 TABLET (50 MCG TOTAL) BY MOUTH DAILY., Disp: 90 tablet, Rfl: 3 .  meloxicam (MOBIC) 15 MG tablet, Take 15 mg by mouth daily., Disp: , Rfl:  .  Multiple Vitamins-Minerals (CENTRUM SILVER ADULT 50+) TABS, Take 1 tablet by mouth daily., Disp: , Rfl:  .  pravastatin (PRAVACHOL) 40 MG tablet, Take 1 tablet (40 mg total) at bedtime by mouth., Disp: 90 tablet, Rfl: 3 .  quinapril (ACCUPRIL) 20 MG tablet, Take 1 tablet (20 mg total) by mouth daily., Disp: 90 tablet, Rfl: 3 .  niacin 250 MG tablet, Take 250 mg by mouth at bedtime., Disp: , Rfl:   Review of Systems  Constitutional: Negative.   HENT: Negative.   Eyes: Negative.   Respiratory: Negative.   Cardiovascular: Negative.   Gastrointestinal: Negative.   Endocrine: Negative.   Genitourinary: Negative.   Musculoskeletal: Negative.   Skin: Negative.   Allergic/Immunologic: Negative.   Neurological: Negative.   Hematological: Negative.   Psychiatric/Behavioral: Negative.  Social History   Tobacco Use  . Smoking status: Never Smoker  . Smokeless tobacco: Never Used  Substance Use Topics  . Alcohol use: Yes    Alcohol/week: 0.0 standard drinks    Comment: rare; during the holidays   Objective:   BP 138/70 (BP Location: Right Arm, Patient Position: Sitting, Cuff Size: Normal)   Pulse (!) 53   Temp 98.3 F (36.8 C) (Oral)   Ht 5\' 8"  (1.727 m)   Wt 170 lb 12.8 oz (77.5 kg)   SpO2 99%   BMI 25.97 kg/m   Wt Readings from Last 3 Encounters:  05/30/18 170 lb 12.8 oz (77.5 kg)  01/30/18 170 lb (77.1 kg)  11/19/17 175 lb (79.4 kg)   Vitals:   05/30/18 0843  BP: 138/70  Pulse: (!) 53  Temp: 98.3 F (36.8 C)  TempSrc: Oral  SpO2: 99%  Weight: 170 lb 12.8 oz (77.5 kg)  Height: 5\' 8"  (1.727 m)   Physical Exam  Constitutional: She is oriented to person, place, and time. She appears well-developed and  well-nourished. No distress.  HENT:  Head: Normocephalic and atraumatic.  Right Ear: Hearing normal.  Left Ear: Hearing normal.  Nose: Nose normal.  Eyes: Conjunctivae and lids are normal. Right eye exhibits no discharge. Left eye exhibits no discharge. No scleral icterus.  Cardiovascular: Normal rate and regular rhythm.  Pulmonary/Chest: Effort normal and breath sounds normal. No respiratory distress.  Abdominal: Soft. Bowel sounds are normal.  Musculoskeletal: Normal range of motion.  Neurological: She is alert and oriented to person, place, and time.  Skin: Skin is intact. No lesion and no rash noted.  Psychiatric: She has a normal mood and affect. Her speech is normal and behavior is normal. Thought content normal.      Assessment & Plan:     1. Essential hypertension Well controlled. Tolerating the HCTZ 12.5 mg qd with Quinapril 20 mg qd without side effects. No chest pains, palpitations, dyspnea or edema. Recheck CBC, TSH and CMP today. Continue to restrict salt intake. - TSH - Comprehensive metabolic panel - CBC with Differential/Platelet  2. Hypothyroidism, unspecified type No palpitations or tremors. No exophthalmos or myxedema noted. No thyroid nodules or goiter. Still taking the Levothyroxine 50 mcg qd. Will get routine follow up labs and recheck pending reports. - TSH - Comprehensive metabolic panel - CBC with Differential/Platelet  3. Hypercholesteremia Trying to follow a low fat diet. Has lost 5 lbs since April 2019. Encouraged to continue regular exercise and continue Pravastatin 40 mg qd with Niacin 250 mg qd. Continue increase fiber in diet and get Lipid Panel, TSH and CMP labs. Recheck pending reports. - TSH - Lipid panel - Comprehensive metabolic panel       Vernie Murders, PA  [facility]

## 2018-05-31 LAB — COMPREHENSIVE METABOLIC PANEL
A/G RATIO: 1.8 (ref 1.2–2.2)
ALT: 9 IU/L (ref 0–32)
AST: 8 IU/L (ref 0–40)
Albumin: 4.3 g/dL (ref 3.5–4.8)
Alkaline Phosphatase: 59 IU/L (ref 39–117)
BILIRUBIN TOTAL: 0.4 mg/dL (ref 0.0–1.2)
BUN/Creatinine Ratio: 18 (ref 12–28)
BUN: 19 mg/dL (ref 8–27)
CHLORIDE: 103 mmol/L (ref 96–106)
CO2: 24 mmol/L (ref 20–29)
Calcium: 9.7 mg/dL (ref 8.7–10.3)
Creatinine, Ser: 1.06 mg/dL — ABNORMAL HIGH (ref 0.57–1.00)
GFR calc Af Amer: 59 mL/min/{1.73_m2} — ABNORMAL LOW (ref >59)
GFR calc non Af Amer: 51 mL/min/{1.73_m2} — ABNORMAL LOW (ref >59)
Globulin, Total: 2.4 g/dL (ref 1.5–4.5)
Glucose: 93 mg/dL (ref 65–99)
POTASSIUM: 3.9 mmol/L (ref 3.5–5.2)
Sodium: 140 mmol/L (ref 134–144)
Total Protein: 6.7 g/dL (ref 6.0–8.5)

## 2018-05-31 LAB — CBC WITH DIFFERENTIAL/PLATELET
BASOS ABS: 0.1 10*3/uL (ref 0.0–0.2)
Basos: 1 %
EOS (ABSOLUTE): 0.1 10*3/uL (ref 0.0–0.4)
Eos: 1 %
HEMATOCRIT: 37.6 % (ref 34.0–46.6)
Hemoglobin: 12.4 g/dL (ref 11.1–15.9)
IMMATURE GRANULOCYTES: 0 %
Immature Grans (Abs): 0 10*3/uL (ref 0.0–0.1)
LYMPHS: 20 %
Lymphocytes Absolute: 2 10*3/uL (ref 0.7–3.1)
MCH: 28.2 pg (ref 26.6–33.0)
MCHC: 33 g/dL (ref 31.5–35.7)
MCV: 86 fL (ref 79–97)
Monocytes Absolute: 0.6 10*3/uL (ref 0.1–0.9)
Monocytes: 6 %
Neutrophils Absolute: 7.1 10*3/uL — ABNORMAL HIGH (ref 1.4–7.0)
Neutrophils: 72 %
Platelets: 333 10*3/uL (ref 150–450)
RBC: 4.39 x10E6/uL (ref 3.77–5.28)
RDW: 12.3 % (ref 12.3–15.4)
WBC: 9.8 10*3/uL (ref 3.4–10.8)

## 2018-05-31 LAB — LIPID PANEL
CHOL/HDL RATIO: 4 ratio (ref 0.0–4.4)
CHOLESTEROL TOTAL: 163 mg/dL (ref 100–199)
HDL: 41 mg/dL (ref >39)
LDL CALC: 90 mg/dL (ref 0–99)
Triglycerides: 159 mg/dL — ABNORMAL HIGH (ref 0–149)
VLDL CHOLESTEROL CAL: 32 mg/dL (ref 5–40)

## 2018-05-31 LAB — TSH: TSH: 2.43 u[IU]/mL (ref 0.450–4.500)

## 2018-08-04 ENCOUNTER — Other Ambulatory Visit: Payer: Self-pay | Admitting: Family Medicine

## 2018-08-04 DIAGNOSIS — E7800 Pure hypercholesterolemia, unspecified: Secondary | ICD-10-CM

## 2018-08-06 ENCOUNTER — Other Ambulatory Visit: Payer: Self-pay | Admitting: Family Medicine

## 2018-08-06 ENCOUNTER — Telehealth: Payer: Self-pay

## 2018-08-06 NOTE — Telephone Encounter (Signed)
Pt returned call

## 2018-08-06 NOTE — Telephone Encounter (Signed)
LMTCB and schedule AWV for February 2020. -MM

## 2018-08-07 NOTE — Telephone Encounter (Signed)
Scheduled AWV for 08/29/18 @ 10:00 AM. -MM

## 2018-08-15 DIAGNOSIS — Z4689 Encounter for fitting and adjustment of other specified devices: Secondary | ICD-10-CM | POA: Diagnosis not present

## 2018-08-29 ENCOUNTER — Ambulatory Visit (INDEPENDENT_AMBULATORY_CARE_PROVIDER_SITE_OTHER): Payer: Medicare Other

## 2018-08-29 VITALS — BP 142/82 | HR 53 | Temp 98.4°F | Ht 68.0 in | Wt 172.0 lb

## 2018-08-29 DIAGNOSIS — Z0000 Encounter for general adult medical examination without abnormal findings: Secondary | ICD-10-CM

## 2018-08-29 DIAGNOSIS — Z1231 Encounter for screening mammogram for malignant neoplasm of breast: Secondary | ICD-10-CM | POA: Diagnosis not present

## 2018-08-29 DIAGNOSIS — Z1211 Encounter for screening for malignant neoplasm of colon: Secondary | ICD-10-CM

## 2018-08-29 NOTE — Patient Instructions (Addendum)
Stacey Fitzgerald , Thank you for taking time to come for your Medicare Wellness Visit. I appreciate your ongoing commitment to your health goals. Please review the following plan we discussed and let me know if I can assist you in the future.   Screening recommendations/referrals: Colonoscopy: Currently due, order placed today. Pt aware office will call to set up apt.  Mammogram: Ordered today. Pt provided with contact info and advised to call to schedule appt.  Bone Density: Up to date, due 01/2022 Recommended yearly ophthalmology/optometry visit for glaucoma screening and checkup Recommended yearly dental visit for hygiene and checkup  Vaccinations: Influenza vaccine: Pt declines today.  Pneumococcal vaccine: Pt declines today.  Tdap vaccine: Pt declines today.  Shingles vaccine: Pt declines today.     Advanced directives: Please bring a copy of your POA (Power of Attorney) and/or Living Will to your next appointment.   Conditions/risks identified: Recommend to continue increasing water intake to at least 6-8 8oz glasses of water per day.  Next appointment: 09/02/18 @ 8:00 AM with Simona Huh Chrismon.    Preventive Care 40 Years and Older, Female Preventive care refers to lifestyle choices and visits with your health care provider that can promote health and wellness. What does preventive care include?  A yearly physical exam. This is also called an annual well check.  Dental exams once or twice a year.  Routine eye exams. Ask your health care provider how often you should have your eyes checked.  Personal lifestyle choices, including:  Daily care of your teeth and gums.  Regular physical activity.  Eating a healthy diet.  Avoiding tobacco and drug use.  Limiting alcohol use.  Practicing safe sex.  Taking low-dose aspirin every day.  Taking vitamin and mineral supplements as recommended by your health care provider. What happens during an annual well check? The services  and screenings done by your health care provider during your annual well check will depend on your age, overall health, lifestyle risk factors, and family history of disease. Counseling  Your health care provider may ask you questions about your:  Alcohol use.  Tobacco use.  Drug use.  Emotional well-being.  Home and relationship well-being.  Sexual activity.  Eating habits.  History of falls.  Memory and ability to understand (cognition).  Work and work Statistician.  Reproductive health. Screening  You may have the following tests or measurements:  Height, weight, and BMI.  Blood pressure.  Lipid and cholesterol levels. These may be checked every 5 years, or more frequently if you are over 56 years old.  Skin check.  Lung cancer screening. You may have this screening every year starting at age 65 if you have a 30-pack-year history of smoking and currently smoke or have quit within the past 15 years.  Fecal occult blood test (FOBT) of the stool. You may have this test every year starting at age 22.  Flexible sigmoidoscopy or colonoscopy. You may have a sigmoidoscopy every 5 years or a colonoscopy every 10 years starting at age 20.  Hepatitis C blood test.  Hepatitis B blood test.  Sexually transmitted disease (STD) testing.  Diabetes screening. This is done by checking your blood sugar (glucose) after you have not eaten for a while (fasting). You may have this done every 1-3 years.  Bone density scan. This is done to screen for osteoporosis. You may have this done starting at age 43.  Mammogram. This may be done every 1-2 years. Talk to your health care provider  about how often you should have regular mammograms. Talk with your health care provider about your test results, treatment options, and if necessary, the need for more tests. Vaccines  Your health care provider may recommend certain vaccines, such as:  Influenza vaccine. This is recommended every  year.  Tetanus, diphtheria, and acellular pertussis (Tdap, Td) vaccine. You may need a Td booster every 10 years.  Zoster vaccine. You may need this after age 8.  Pneumococcal 13-valent conjugate (PCV13) vaccine. One dose is recommended after age 63.  Pneumococcal polysaccharide (PPSV23) vaccine. One dose is recommended after age 31. Talk to your health care provider about which screenings and vaccines you need and how often you need them. This information is not intended to replace advice given to you by your health care provider. Make sure you discuss any questions you have with your health care provider. Document Released: 08/05/2015 Document Revised: 03/28/2016 Document Reviewed: 05/10/2015 Elsevier Interactive Patient Education  2017 [employer] Prevention in the Home Falls can cause injuries. They can happen to people of all ages. There are many things you can do to make your home safe and to help prevent falls. What can I do on the outside of my home?  Regularly fix the edges of walkways and driveways and fix any cracks.  Remove anything that might make you trip as you walk through a door, such as a raised step or threshold.  Trim any bushes or trees on the path to your home.  Use bright outdoor lighting.  Clear any walking paths of anything that might make someone trip, such as rocks or tools.  Regularly check to see if handrails are loose or broken. Make sure that both sides of any steps have handrails.  Any raised decks and porches should have guardrails on the edges.  Have any leaves, snow, or ice cleared regularly.  Use sand or salt on walking paths during winter.  Clean up any spills in your garage right away. This includes oil or grease spills. What can I do in the bathroom?  Use night lights.  Install grab bars by the toilet and in the tub and shower. Do not use towel bars as grab bars.  Use non-skid mats or decals in the tub or shower.  If you  need to sit down in the shower, use a plastic, non-slip stool.  Keep the floor dry. Clean up any water that spills on the floor as soon as it happens.  Remove soap buildup in the tub or shower regularly.  Attach bath mats securely with double-sided non-slip rug tape.  Do not have throw rugs and other things on the floor that can make you trip. What can I do in the bedroom?  Use night lights.  Make sure that you have a light by your bed that is easy to reach.  Do not use any sheets or blankets that are too big for your bed. They should not hang down onto the floor.  Have a firm chair that has side arms. You can use this for support while you get dressed.  Do not have throw rugs and other things on the floor that can make you trip. What can I do in the kitchen?  Clean up any spills right away.  Avoid walking on wet floors.  Keep items that you use a lot in easy-to-reach places.  If you need to reach something above you, use a strong step stool that has a grab bar.  Keep electrical cords out of the way.  Do not use floor polish or wax that makes floors slippery. If you must use wax, use non-skid floor wax.  Do not have throw rugs and other things on the floor that can make you trip. What can I do with my stairs?  Do not leave any items on the stairs.  Make sure that there are handrails on both sides of the stairs and use them. Fix handrails that are broken or loose. Make sure that handrails are as long as the stairways.  Check any carpeting to make sure that it is firmly attached to the stairs. Fix any carpet that is loose or worn.  Avoid having throw rugs at the top or bottom of the stairs. If you do have throw rugs, attach them to the floor with carpet tape.  Make sure that you have a light switch at the top of the stairs and the bottom of the stairs. If you do not have them, ask someone to add them for you. What else can I do to help prevent falls?  Wear shoes  that:  Do not have high heels.  Have rubber bottoms.  Are comfortable and fit you well.  Are closed at the toe. Do not wear sandals.  If you use a stepladder:  Make sure that it is fully opened. Do not climb a closed stepladder.  Make sure that both sides of the stepladder are locked into place.  Ask someone to hold it for you, if possible.  Clearly mark and make sure that you can see:  Any grab bars or handrails.  First and last steps.  Where the edge of each step is.  Use tools that help you move around (mobility aids) if they are needed. These include:  Canes.  Walkers.  Scooters.  Crutches.  Turn on the lights when you go into a dark area. Replace any light bulbs as soon as they burn out.  Set up your furniture so you have a clear path. Avoid moving your furniture around.  If any of your floors are uneven, fix them.  If there are any pets around you, be aware of where they are.  Review your medicines with your doctor. Some medicines can make you feel dizzy. This can increase your chance of falling. Ask your doctor what other things that you can do to help prevent falls. This information is not intended to replace advice given to you by your health care provider. Make sure you discuss any questions you have with your health care provider. Document Released: 05/05/2009 Document Revised: 12/15/2015 Document Reviewed: 08/13/2014 Elsevier Interactive Patient Education  2017 [employer].

## 2018-08-29 NOTE — Progress Notes (Addendum)
Subjective:   Stacey Fitzgerald is a 78 y.o. female who presents for Medicare Annual (Subsequent) preventive examination.  Review of Systems:  N/A  Cardiac Risk Factors include: advanced age (>16men, >13 women);dyslipidemia;hypertension     Objective:     Vitals: BP (!) 142/82 (BP Location: Right Arm)   Pulse (!) 53   Temp 98.4 F (36.9 C) (Oral)   Ht 5\' 8"  (1.727 m)   Wt 172 lb (78 kg)   BMI 26.15 kg/m   Body mass index is 26.15 kg/m.  Advanced Directives 08/29/2018 08/13/2017 02/04/2015  Does Patient Have a Medical Advance Directive? Yes Yes Yes  Type of Paramedic of [place];Living will Living will [place];Living will  Does patient want to make changes to medical advance directive? - - No - Patient declined  Copy of [place] in Chart? No - copy requested - -    Tobacco Social History   Tobacco Use  Smoking Status Never Smoker  Smokeless Tobacco Never Used     Counseling given: Not Answered   Clinical Intake:  Pre-visit preparation completed: Yes  Pain : No/denies pain Pain Score: 0-No pain     Nutritional Status: BMI 25 -29 Overweight Nutritional Risks: None Diabetes: No  How often do you need to have someone help you when you read instructions, pamphlets, or other written materials from your doctor or pharmacy?: 1 - Never  Interpreter Needed?: No  Information entered by :: [facility], LPN  Past Medical History:  Diagnosis Date  . Arthritis   . Female cystocele   . Hyperlipidemia   . Hypertension   . Hypothyroidism   . Menopausal state   . Obesity, unspecified   . Rectocele    Past Surgical History:  Procedure Laterality Date  . BREAST EXCISIONAL BIOPSY Left 1972   benign  . BREAST SURGERY     benign cyst  . COLONOSCOPY    . miscarrage    . TUBAL LIGATION    . uterine pessary     Family History  Problem Relation Age of Onset  . Diabetes Mother   . Seizures Mother     . CVA Father   . Dementia Brother   . Diabetes Maternal Grandfather   . Mental illness Sister    Social History   Socioeconomic History  . Marital status: Married    Spouse name: Not on file  . Number of children: 2  . Years of education: Not on file  . Highest education level: 12th grade  Occupational History  . Occupation: retired  Scientific laboratory technician  . Financial resource strain: Not hard at all  . Food insecurity:    Worry: Never true    Inability: Never true  . Transportation needs:    Medical: No    Non-medical: No  Tobacco Use  . Smoking status: Never Smoker  . Smokeless tobacco: Never Used  Substance and Sexual Activity  . Alcohol use: Yes    Alcohol/week: 0.0 standard drinks    Comment: rare; during the holidays  . Drug use: No  . Sexual activity: Not on file  Lifestyle  . Physical activity:    Days per week: 0 days    Minutes per session: 0 min  . Stress: Not at all  Relationships  . Social connections:    Talks on phone: Patient refused    Gets together: Patient refused    Attends religious service: Patient refused    Active  member of club or organization: Patient refused    Attends meetings of clubs or organizations: Patient refused    Relationship status: Patient refused  Other Topics Concern  . Not on file  Social History Narrative  . Not on file    Outpatient Encounter Medications as of 08/29/2018  Medication Sig  . Acetaminophen (TYLENOL 8 HOUR ARTHRITIS PAIN PO) Take by mouth as needed.  Marland Kitchen levothyroxine (SYNTHROID, LEVOTHROID) 50 MCG tablet TAKE 1 TABLET (50 MCG TOTAL) BY MOUTH DAILY.  . meloxicam (MOBIC) 15 MG tablet TAKE 1 TABLET BY MOUTH EVERY DAY (Patient taking differently: Take 15 mg by mouth daily. As needed)  . Multiple Vitamins-Minerals (CENTRUM SILVER ADULT 50+) TABS Take 1 tablet by mouth daily.  . niacin 250 MG tablet Take 250 mg by mouth at bedtime.  . pravastatin (PRAVACHOL) 40 MG tablet TAKE 1 TABLET (40 MG TOTAL) AT BEDTIME BY MOUTH.   . quinapril (ACCUPRIL) 20 MG tablet Take 1 tablet (20 mg total) by mouth daily.  . hydrochlorothiazide (HYDRODIURIL) 12.5 MG tablet Take 1 tablet (12.5 mg total) by mouth daily. (Patient not taking: Reported on 08/29/2018)  . ibuprofen (ADVIL,MOTRIN) 200 MG tablet Take 200 mg by mouth every 6 (six) hours as needed.   No facility-administered encounter medications on file as of 08/29/2018.     Activities of Daily Living In your present state of health, do you have any difficulty performing the following activities: 08/29/2018  Hearing? N  Vision? N  Difficulty concentrating or making decisions? N  Walking or climbing stairs? N  Dressing or bathing? N  Doing errands, shopping? N  Preparing Food and eating ? N  Using the Toilet? N  In the past six months, have you accidently leaked urine? N  Do you have problems with loss of bowel control? N  Managing your Medications? N  Managing your Finances? N  Housekeeping or managing your Housekeeping? N  Some recent data might be hidden    Patient Care Team: Chrismon, Vickki Muff, PA as PCP - General (Physician Assistant) Ward, Honor Loh, MD as Referring Physician (Obstetrics and Gynecology)    Assessment:   This is a routine wellness examination for Stacey Fitzgerald.  Exercise Activities and Dietary recommendations Current Exercise Habits: The patient does not participate in regular exercise at present, Exercise limited by: None identified  Goals    . DIET - INCREASE WATER INTAKE     Recommend to start drinking at least 2-4 glasses of water a day.   08/29/18: Recommend to continue increasing water intake to at least 6-8 8oz glasses of water per day.        Fall Risk Fall Risk  08/29/2018 05/30/2018 08/13/2017 09/28/2016  Falls in the past year? 0 0 No Yes  Number falls in past yr: - - - 1  Injury with Fall? - - - No  Follow up - - - Falls evaluation completed   FALL RISK PREVENTION PERTAINING TO THE HOME:  Any stairs in or around the home WITH  handrails? Yes  Home free of loose throw rugs in walkways, pet beds, electrical cords, etc? Yes  Adequate lighting in your home to reduce risk of falls? Yes   ASSISTIVE DEVICES UTILIZED TO PREVENT FALLS:  Life alert? No  Use of a cane, walker or w/c? No  Grab bars in the bathroom? Yes  Shower chair or bench in shower? No  Elevated toilet seat or a handicapped toilet? Yes    TIMED UP AND GO:  Was the test performed? No .     Depression Screen PHQ 2/9 Scores 08/29/2018 05/30/2018 08/13/2017 09/28/2016  PHQ - 2 Score 0 0 0 0  PHQ- 9 Score - - - 0     Cognitive Function: Declined today.          There is no immunization history on file for this patient.  Qualifies for Shingles Vaccine? Yes . Due for Shingrix. Education has been provided regarding the importance of this vaccine. Pt has been advised to call insurance company to determine out of pocket expense. Advised may also receive vaccine at local pharmacy or Health Dept. Verbalized acceptance and understanding.  Tdap: Although this vaccine is not a covered service during a Wellness Exam, does the patient still wish to receive this vaccine today?  No .  Education has been provided regarding the importance of this vaccine. Advised may receive this vaccine at local pharmacy or Health Dept. Aware to provide a copy of the vaccination record if obtained from local pharmacy or Health Dept. Verbalized acceptance and understanding.  Flu Vaccine: Due for Flu vaccine. Does the patient want to receive this vaccine today?  No . Education has been provided regarding the importance of this vaccine but still declined. Advised may receive this vaccine at local pharmacy or Health Dept. Aware to provide a copy of the vaccination record if obtained from local pharmacy or Health Dept. Verbalized acceptance and understanding.  Pneumococcal Vaccine: Due for Pneumococcal vaccine. Does the patient want to receive this vaccine today?  No . Education has been  provided regarding the importance of this vaccine but still declined. Advised may receive this vaccine at local pharmacy or Health Dept. Aware to provide a copy of the vaccination record if obtained from local pharmacy or Health Dept. Verbalized acceptance and understanding.  Screening Tests Health Maintenance  Topic Date Due  . TETANUS/TDAP  [birth year]10/1959  . COLONOSCOPY  01/09/2017  . INFLUENZA VACCINE  10/21/2018 (Originally 02/20/2018)  . PNA vac Low Risk Adult (1 of 2 - PCV13) 05/31/2019 (Originally 10/11/2006)  . DEXA SCAN  02/14/2021    Cancer Screenings:  Colorectal Screening: Completed 01/10/12. Repeat every 5 years; Referral to GI placed today. Pt aware the office will call re: appt.  Mammogram: Completed 03/21/16. Repeat every year; Ordered today. Pt provided with contact info and advised to call to schedule appt.   Bone Density: Completed 02/15/16. Results reflect OSTEOPENIA. Repeat every 5 years.   Lung Cancer Screening: (Low Dose CT Chest recommended if Age 31-80 years, 30 pack-year currently smoking OR have quit w/in 15years.) does not qualify.    Additional Screening:  Vision Screening: Recommended annual ophthalmology exams for early detection of glaucoma and other disorders of the eye.  Dental Screening: Recommended annual dental exams for proper oral hygiene  Community Resource Referral:  CRR required this visit?  No       Plan:  I have personally reviewed and addressed the Medicare Annual Wellness questionnaire and have noted the following in the patient's chart:  A. Medical and social history B. Use of alcohol, tobacco or illicit drugs  C. Current medications and supplements D. Functional ability and status E.  Nutritional status F.  Physical activity G. Advance directives H. List of other physicians I.  Hospitalizations, surgeries, and ER visits in previous 12 months J.  [place] such as hearing and vision if needed, cognitive and  depression L. Referrals and appointments - none  In addition, I have reviewed and  discussed with patient certain preventive protocols, quality metrics, and best practice recommendations. A written personalized care plan for preventive services as well as general preventive health recommendations were provided to patient.  See attached scanned questionnaire for additional information.   Signed,  Fabio Neighbors, LPN Nurse Health Advisor   Nurse Recommendations: Pt declined the tetanus vaccine, pneumonia vaccine and influenza vaccines today. Orders placed for colonoscopy and mammogram.   Reviewed documentation and recommendations of Nurse Health Advisor's screening. Was available for consultation. Agree with note and plan.

## 2018-09-01 NOTE — Progress Notes (Deleted)
Patient: Stacey Fitzgerald, Female    DOB: [birth year], 78 y.o.   MRN: [id number] Visit Date: 09/01/2018  Today's Provider: Vernie Murders, PA   No chief complaint on file.  Subjective:   Patient saw [facility] 08/29/2018 for AWV.    [name] is a 78 y.o. female. She feels {DESC; WELL/FAIRLY WELL/POORLY:18703}. She reports exercising ***. She reports she is sleeping {DESC; WELL/FAIRLY WELL/POORLY:18703}.  -----------------------------------------------------------   Review of Systems  Social History   Socioeconomic History  . Marital status: Married    Spouse name: Not on file  . Number of children: 2  . Years of education: Not on file  . Highest education level: 12th grade  Occupational History  . Occupation: retired  Scientific laboratory technician  . Financial resource strain: Not hard at all  . Food insecurity:    Worry: Never true    Inability: Never true  . Transportation needs:    Medical: No    Non-medical: No  Tobacco Use  . Smoking status: Never Smoker  . Smokeless tobacco: Never Used  Substance and Sexual Activity  . Alcohol use: Yes    Alcohol/week: 0.0 standard drinks    Comment: rare; during the holidays  . Drug use: No  . Sexual activity: Not on file  Lifestyle  . Physical activity:    Days per week: 0 days    Minutes per session: 0 min  . Stress: Not at all  Relationships  . Social connections:    Talks on phone: Patient refused    Gets together: Patient refused    Attends religious service: Patient refused    Active member of club or organization: Patient refused    Attends meetings of clubs or organizations: Patient refused    Relationship status: Patient refused  . Intimate partner violence:    Fear of current or ex partner: No    Emotionally abused: No    Physically abused: No    Forced sexual activity: No  Other Topics Concern  . Not on file  Social History Narrative  . Not on file    Past Medical History:    Diagnosis Date  . Arthritis   . Female cystocele   . Hyperlipidemia   . Hypertension   . Hypothyroidism   . Menopausal state   . Obesity, unspecified   . Rectocele      Patient Active Problem List   Diagnosis Date Noted  . Abortion, spontaneous 02/02/2015  . Bacterial skin infection of leg 02/02/2015  . HLD (hyperlipidemia) 02/02/2015  . Pure hypercholesterolemia 01/28/2015  . Essential hypertension 01/28/2015  . Cellulitis 01/28/2015  . Hypothyroidism 01/28/2015  . Monilial intertrigo 01/28/2015  . Rhus dermatitis 01/28/2015  . Low back pain, episodic 01/28/2015  . Hernia, rectovaginal 10/20/2013  . Adiposity 10/20/2013  . Climacteric 10/20/2013  . BP (high blood pressure) 10/20/2013  . Hypercholesteremia 10/20/2013  . Cystocele with prolapse 10/20/2013  . Arthritis 10/20/2013    Past Surgical History:  Procedure Laterality Date  . BREAST EXCISIONAL BIOPSY Left 1972   benign  . BREAST SURGERY     benign cyst  . COLONOSCOPY    . miscarrage    . TUBAL LIGATION    . uterine pessary      Her family history includes CVA in her father; Dementia in her brother; Diabetes in her maternal grandfather and mother; Mental illness in her sister; Seizures in her mother.   Current Outpatient Medications:  .  Acetaminophen (TYLENOL 8 HOUR ARTHRITIS PAIN PO), Take by mouth as needed., Disp: , Rfl:  .  hydrochlorothiazide (HYDRODIURIL) 12.5 MG tablet, Take 1 tablet (12.5 mg total) by mouth daily. (Patient not taking: Reported on 08/29/2018), Disp: 90 tablet, Rfl: 3 .  ibuprofen (ADVIL,MOTRIN) 200 MG tablet, Take 200 mg by mouth every 6 (six) hours as needed., Disp: , Rfl:  .  levothyroxine (SYNTHROID, LEVOTHROID) 50 MCG tablet, TAKE 1 TABLET (50 MCG TOTAL) BY MOUTH DAILY., Disp: 90 tablet, Rfl: 3 .  meloxicam (MOBIC) 15 MG tablet, TAKE 1 TABLET BY MOUTH EVERY DAY (Patient taking differently: Take 15 mg by mouth daily. As needed), Disp: 90 tablet, Rfl: 1 .  Multiple  Vitamins-Minerals (CENTRUM SILVER ADULT 50+) TABS, Take 1 tablet by mouth daily., Disp: , Rfl:  .  niacin 250 MG tablet, Take 250 mg by mouth at bedtime., Disp: , Rfl:  .  pravastatin (PRAVACHOL) 40 MG tablet, TAKE 1 TABLET (40 MG TOTAL) AT BEDTIME BY MOUTH., Disp: 90 tablet, Rfl: 3 .  quinapril (ACCUPRIL) 20 MG tablet, Take 1 tablet (20 mg total) by mouth daily., Disp: 90 tablet, Rfl: 3  Patient Care Team: Chrismon, Vickki Muff, PA as PCP - General (Physician Assistant) Ward, Honor Loh, MD as Referring Physician (Obstetrics and Gynecology)     Objective:    Vitals: There were no vitals taken for this visit.  Physical Exam  Activities of Daily Living In your present state of health, do you have any difficulty performing the following activities: 08/29/2018  Hearing? N  Vision? N  Difficulty concentrating or making decisions? N  Walking or climbing stairs? N  Dressing or bathing? N  Doing errands, shopping? N  Preparing Food and eating ? N  Using the Toilet? N  In the past six months, have you accidently leaked urine? N  Do you have problems with loss of bowel control? N  Managing your Medications? N  Managing your Finances? N  Housekeeping or managing your Housekeeping? N  Some recent data might be hidden    Fall Risk Assessment Fall Risk  08/29/2018 05/30/2018 08/13/2017 09/28/2016  Falls in the past year? 0 0 No Yes  Number falls in past yr: - - - 1  Injury with Fall? - - - No  Follow up - - - Falls evaluation completed     Depression Screen PHQ 2/9 Scores 08/29/2018 05/30/2018 08/13/2017 09/28/2016  PHQ - 2 Score 0 0 0 0  PHQ- 9 Score - - - 0    No flowsheet data found.     Assessment & Plan:    Annual Physical Reviewed patient's Family Medical History Reviewed and updated list of patient's medical providers Assessment of cognitive impairment was done Assessed patient's functional ability Established a written schedule for health screening [place]  Completed and Reviewed  Exercise Activities and Dietary recommendations Goals    . DIET - INCREASE WATER INTAKE     Recommend to start drinking at least 2-4 glasses of water a day.   08/29/18: Recommend to continue increasing water intake to at least 6-8 8oz glasses of water per day.         There is no immunization history on file for this patient.  Health Maintenance  Topic Date Due  . TETANUS/TDAP  10/10/1960  . COLONOSCOPY  01/09/2017  . INFLUENZA VACCINE  10/21/2018 (Originally 02/20/2018)  . PNA vac Low Risk Adult (1 of 2 - PCV13) 05/31/2019 (Originally 10/11/2006)  . DEXA SCAN  02/14/2021     Discussed health benefits of physical activity, and encouraged her to engage in regular exercise appropriate for her age and condition.    ------------------------------------------------------------------------------------------------------------    Vernie Murders, [place]

## 2018-09-02 ENCOUNTER — Ambulatory Visit: Payer: Self-pay | Admitting: Family Medicine

## 2018-09-04 ENCOUNTER — Encounter: Payer: Medicare Other | Admitting: Family Medicine

## 2018-09-08 NOTE — Progress Notes (Signed)
Patient: Stacey Fitzgerald Female    DOB: 05-26-1941   78 y.o.   MRN: [id number] Visit Date: 09/11/2018  Today's Provider: Vernie Murders, PA   Chief Complaint  Patient presents with  . Follow-up  . Hypertension  . Hyperlipidemia  . Hypothyroidism   Subjective:     HPI    Hypertension, follow-up:  BP Readings from Last 3 Encounters:  09/11/18 120/70  08/29/18 (!) 142/82  05/30/18 138/70    She was last seen for hypertension 3 months ago.  BP at that visit was 138/70. Management since that visit includes; labs checked, no changes.She reports good compliance with treatment. She is not having side effects. none She some exercising. She is adherent to low salt diet.   Outside blood pressures are 130/80. She is experiencing none.  Patient denies none.   Cardiovascular risk factors include none.  Use of agents associated with hypertension: none.   ---------------------------------------------------------------    Lipid/Cholesterol, Follow-up:   Last seen for this 3 months ago.  Management since that visit includes; labs checked, advised diet and exercise.  Last Lipid Panel:    Component Value Date/Time   CHOL 163 05/30/2018 0918   TRIG 159 (H) 05/30/2018 0918   HDL 41 05/30/2018 0918   CHOLHDL 4.0 05/30/2018 0918   CHOLHDL 5.0 (H) 05/31/2017 0814   LDLCALC 90 05/30/2018 0918   LDLCALC 151 (H) 05/31/2017 0814    She reports good compliance with treatment. She is not having side effects. none  Wt Readings from Last 3 Encounters:  09/11/18 173 lb (78.5 kg)  08/29/18 172 lb (78 kg)  05/30/18 170 lb 12.8 oz (77.5 kg)   ---------------------------------------------------------------  Hypothyroidism, unspecified type From 05/30/2018-labs checked, no changes.   Past Medical History:  Diagnosis Date  . Arthritis   . Female cystocele   . Hyperlipidemia   . Hypertension   . Hypothyroidism   . Menopausal state   . Obesity, unspecified   .  Rectocele    Past Surgical History:  Procedure Laterality Date  . BREAST EXCISIONAL BIOPSY Left 1972   benign  . BREAST SURGERY     benign cyst  . COLONOSCOPY    . miscarrage    . TUBAL LIGATION    . uterine pessary     Family History  Problem Relation Age of Onset  . Diabetes Mother   . Seizures Mother   . CVA Father   . Dementia Brother   . Diabetes Maternal Grandfather   . Mental illness Sister    Allergies  Allergen Reactions  . Cephalexin Hives    Current Outpatient Medications:  .  Acetaminophen (TYLENOL 8 HOUR ARTHRITIS PAIN PO), Take by mouth as needed., Disp: , Rfl:  .  hydrochlorothiazide (HYDRODIURIL) 12.5 MG tablet, Take 1 tablet (12.5 mg total) by mouth daily., Disp: 90 tablet, Rfl: 3 .  ibuprofen (ADVIL,MOTRIN) 200 MG tablet, Take 200 mg by mouth every 6 (six) hours as needed., Disp: , Rfl:  .  levothyroxine (SYNTHROID, LEVOTHROID) 50 MCG tablet, TAKE 1 TABLET (50 MCG TOTAL) BY MOUTH DAILY., Disp: 90 tablet, Rfl: 3 .  meloxicam (MOBIC) 15 MG tablet, TAKE 1 TABLET BY MOUTH EVERY DAY (Patient taking differently: Take 15 mg by mouth daily. As needed), Disp: 90 tablet, Rfl: 1 .  Multiple Vitamins-Minerals (CENTRUM SILVER ADULT 50+) TABS, Take 1 tablet by mouth daily., Disp: , Rfl:  .  pravastatin (PRAVACHOL) 40 MG tablet, TAKE 1 TABLET (40 MG TOTAL)  AT BEDTIME BY MOUTH., Disp: 90 tablet, Rfl: 3 .  quinapril (ACCUPRIL) 20 MG tablet, Take 1 tablet (20 mg total) by mouth daily., Disp: 90 tablet, Rfl: 3  Review of Systems  Constitutional: Negative for appetite change, chills, fatigue and fever.  Respiratory: Negative for chest tightness and shortness of breath.   Cardiovascular: Negative for chest pain and palpitations.  Gastrointestinal: Negative for abdominal pain, nausea and vomiting.  Neurological: Negative for dizziness and weakness.   Social History   Tobacco Use  . Smoking status: Never Smoker  . Smokeless tobacco: Never Used  Substance Use Topics  .  Alcohol use: Yes    Alcohol/week: 0.0 standard drinks    Comment: rare; during the holidays     Objective:   BP 120/70 (BP Location: Right Arm, Patient Position: Sitting, Cuff Size: Large)   Pulse 60   Temp 97.8 F (36.6 C) (Oral)   Resp 16   Wt 173 lb (78.5 kg)   SpO2 98%   BMI 26.30 kg/m  Vitals:   09/11/18 0809  BP: 120/70  Pulse: 60  Resp: 16  Temp: 97.8 F (36.6 C)  TempSrc: Oral  SpO2: 98%  Weight: 173 lb (78.5 kg)   Physical Exam Constitutional:      General: She is not in acute distress.    Appearance: She is well-developed.  HENT:     Head: Normocephalic and atraumatic.     Right Ear: Hearing and tympanic membrane normal.     Left Ear: Hearing and tympanic membrane normal.     Nose: Nose normal.  Eyes:     General: Lids are normal. No scleral icterus.       Right eye: No discharge.        Left eye: No discharge.     Conjunctiva/sclera: Conjunctivae normal.  Pulmonary:     Effort: Pulmonary effort is normal. No respiratory distress.  Musculoskeletal: Normal range of motion.  Skin:    Findings: No lesion or rash.  Neurological:     Mental Status: She is alert and oriented to person, place, and time.  Psychiatric:        Speech: Speech normal.        Behavior: Behavior normal.        Thought Content: Thought content normal.       Assessment & Plan    1. Pure hypercholesterolemia Tolerating Pravastatin without side effects. Should continue low fat diet and keep weight down. Has stopped taking Niacin (denies hot flashes). Recheck labs and follow up pending reports. - Comprehensive metabolic panel - Lipid panel - TSH  2. Hypothyroidism, unspecified type No significant weight change or heat/cold intolerance. Continues Levothyroxine 50 mcg qd. Will recheck routine labs and follow up pending reports. - CBC with Differential/Platelet - Comprehensive metabolic panel - TSH - T4  3. Essential hypertension Well controlled BP and continues Quinapril 20  mg qd with HCTZ 12.5 mg qd. No side effects of muscle weakness or myalgias. Recheck labs and continue to restrict salt intake. - CBC with Differential/Platelet - Comprehensive metabolic panel - Lipid panel - TSH     Vernie Murders, PA  [facility]

## 2018-09-11 ENCOUNTER — Encounter: Payer: Self-pay | Admitting: Family Medicine

## 2018-09-11 ENCOUNTER — Ambulatory Visit (INDEPENDENT_AMBULATORY_CARE_PROVIDER_SITE_OTHER): Payer: Medicare Other | Admitting: Family Medicine

## 2018-09-11 VITALS — BP 120/70 | HR 60 | Temp 97.8°F | Resp 16 | Wt 173.0 lb

## 2018-09-11 DIAGNOSIS — I10 Essential (primary) hypertension: Secondary | ICD-10-CM

## 2018-09-11 DIAGNOSIS — E039 Hypothyroidism, unspecified: Secondary | ICD-10-CM | POA: Diagnosis not present

## 2018-09-11 DIAGNOSIS — E7800 Pure hypercholesterolemia, unspecified: Secondary | ICD-10-CM | POA: Diagnosis not present

## 2018-09-12 LAB — COMPREHENSIVE METABOLIC PANEL
A/G RATIO: 1.8 (ref 1.2–2.2)
ALBUMIN: 4.2 g/dL (ref 3.7–4.7)
ALT: 10 IU/L (ref 0–32)
AST: 9 IU/L (ref 0–40)
Alkaline Phosphatase: 58 IU/L (ref 39–117)
BUN/Creatinine Ratio: 17 (ref 12–28)
BUN: 19 mg/dL (ref 8–27)
Bilirubin Total: 0.4 mg/dL (ref 0.0–1.2)
CALCIUM: 9.8 mg/dL (ref 8.7–10.3)
CO2: 23 mmol/L (ref 20–29)
CREATININE: 1.1 mg/dL — AB (ref 0.57–1.00)
Chloride: 105 mmol/L (ref 96–106)
GFR calc non Af Amer: 49 mL/min/{1.73_m2} — ABNORMAL LOW (ref >59)
GFR, EST AFRICAN AMERICAN: 56 mL/min/{1.73_m2} — AB (ref >59)
GLOBULIN, TOTAL: 2.3 g/dL (ref 1.5–4.5)
Glucose: 87 mg/dL (ref 65–99)
Potassium: 4.5 mmol/L (ref 3.5–5.2)
SODIUM: 143 mmol/L (ref 134–144)
TOTAL PROTEIN: 6.5 g/dL (ref 6.0–8.5)

## 2018-09-12 LAB — T4: T4, Total: 9.2 ug/dL (ref 4.5–12.0)

## 2018-09-12 LAB — LIPID PANEL
Chol/HDL Ratio: 4 ratio (ref 0.0–4.4)
Cholesterol, Total: 175 mg/dL (ref 100–199)
HDL: 44 mg/dL (ref >39)
LDL CALC: 99 mg/dL (ref 0–99)
TRIGLYCERIDES: 161 mg/dL — AB (ref 0–149)
VLDL Cholesterol Cal: 32 mg/dL (ref 5–40)

## 2018-09-12 LAB — CBC WITH DIFFERENTIAL/PLATELET
BASOS: 1 %
Basophils Absolute: 0.1 10*3/uL (ref 0.0–0.2)
EOS (ABSOLUTE): 0.2 10*3/uL (ref 0.0–0.4)
EOS: 3 %
HEMATOCRIT: 37.1 % (ref 34.0–46.6)
HEMOGLOBIN: 12.3 g/dL (ref 11.1–15.9)
IMMATURE GRANS (ABS): 0 10*3/uL (ref 0.0–0.1)
IMMATURE GRANULOCYTES: 0 %
Lymphocytes Absolute: 1.8 10*3/uL (ref 0.7–3.1)
Lymphs: 25 %
MCH: 28.5 pg (ref 26.6–33.0)
MCHC: 33.2 g/dL (ref 31.5–35.7)
MCV: 86 fL (ref 79–97)
MONOCYTES: 7 %
Monocytes Absolute: 0.5 10*3/uL (ref 0.1–0.9)
NEUTROS PCT: 64 %
Neutrophils Absolute: 4.7 10*3/uL (ref 1.4–7.0)
Platelets: 293 10*3/uL (ref 150–450)
RBC: 4.31 x10E6/uL (ref 3.77–5.28)
RDW: 12.6 % (ref 11.7–15.4)
WBC: 7.3 10*3/uL (ref 3.4–10.8)

## 2018-09-12 LAB — TSH: TSH: 3.96 u[IU]/mL (ref 0.450–4.500)

## 2018-09-15 ENCOUNTER — Other Ambulatory Visit: Payer: Self-pay | Admitting: Family Medicine

## 2018-09-15 DIAGNOSIS — E039 Hypothyroidism, unspecified: Secondary | ICD-10-CM

## 2018-09-17 DIAGNOSIS — Z800 Family history of malignant neoplasm of digestive organs: Secondary | ICD-10-CM | POA: Diagnosis not present

## 2018-10-02 ENCOUNTER — Other Ambulatory Visit: Payer: Self-pay

## 2018-10-02 ENCOUNTER — Ambulatory Visit
Admission: RE | Admit: 2018-10-02 | Discharge: 2018-10-02 | Disposition: A | Discharge status: Home / Self Care | Payer: Medicare Other | Source: Ambulatory Visit | Attending: Family Medicine | Admitting: Family Medicine

## 2018-10-02 DIAGNOSIS — Z1231 Encounter for screening mammogram for malignant neoplasm of breast: Secondary | ICD-10-CM

## 2018-10-24 ENCOUNTER — Ambulatory Visit (INDEPENDENT_AMBULATORY_CARE_PROVIDER_SITE_OTHER): Payer: Medicare Other | Admitting: Family Medicine

## 2018-10-24 ENCOUNTER — Encounter: Payer: Self-pay | Admitting: Family Medicine

## 2018-10-24 ENCOUNTER — Other Ambulatory Visit: Payer: Self-pay

## 2018-10-24 VITALS — BP 170/82 | HR 50 | Temp 97.6°F | Ht 68.0 in | Wt 176.0 lb

## 2018-10-24 DIAGNOSIS — L255 Unspecified contact dermatitis due to plants, except food: Secondary | ICD-10-CM

## 2018-10-24 MED ORDER — PREDNISONE 5 MG PO TABS: ORAL_TABLET | ORAL | 0 refills | Status: DC

## 2018-10-24 NOTE — Progress Notes (Signed)
Patient: Stacey Fitzgerald Female    DOB: 05-19-1941   78 y.o.   MRN: [id number] Visit Date: 10/24/2018  Today's Provider: Vernie Murders, PA   Chief Complaint  Patient presents with  . Poison Oak    both arms and eyes since 10/16/18   Subjective:     HPI  Pt reports she was working in yard last Thursday 10/16/18 getting up wood and thinks she got around poison oak.  She has had swelling around eyes and has places on both arms.  There are no diagnoses linked to this encounter. Patient Active Problem List   Diagnosis Date Noted  . Abortion, spontaneous 02/02/2015  . Bacterial skin infection of leg 02/02/2015  . HLD (hyperlipidemia) 02/02/2015  . Pure hypercholesterolemia 01/28/2015  . Essential hypertension 01/28/2015  . Cellulitis 01/28/2015  . Hypothyroidism 01/28/2015  . Monilial intertrigo 01/28/2015  . Rhus dermatitis 01/28/2015  . Low back pain, episodic 01/28/2015  . Hernia, rectovaginal 10/20/2013  . Adiposity 10/20/2013  . Climacteric 10/20/2013  . BP (high blood pressure) 10/20/2013  . Hypercholesteremia 10/20/2013  . Cystocele with prolapse 10/20/2013  . Arthritis 10/20/2013   Past Surgical History:  Procedure Laterality Date  . BREAST EXCISIONAL BIOPSY Left 1972   benign  . BREAST SURGERY     benign cyst  . COLONOSCOPY    . miscarrage    . TUBAL LIGATION    . uterine pessary     Family History  Problem Relation Age of Onset  . Diabetes Mother   . Seizures Mother   . CVA Father   . Dementia Brother   . Diabetes Maternal Grandfather   . Mental illness Sister   . Breast cancer Neg Hx    Allergies  Allergen Reactions  . Cephalexin Hives    Current Outpatient Medications:  .  Acetaminophen (TYLENOL 8 HOUR ARTHRITIS PAIN PO), Take by mouth as needed., Disp: , Rfl:  .  hydrochlorothiazide (HYDRODIURIL) 12.5 MG tablet, Take 1 tablet (12.5 mg total) by mouth daily., Disp: 90 tablet, Rfl: 3 .  ibuprofen (ADVIL,MOTRIN) 200 MG tablet, Take 200  mg by mouth every 6 (six) hours as needed., Disp: , Rfl:  .  levothyroxine (SYNTHROID, LEVOTHROID) 50 MCG tablet, TAKE 1 TABLET (50 MCG TOTAL) BY MOUTH DAILY., Disp: 90 tablet, Rfl: 3 .  meloxicam (MOBIC) 15 MG tablet, TAKE 1 TABLET BY MOUTH EVERY DAY (Patient taking differently: Take 15 mg by mouth daily. As needed), Disp: 90 tablet, Rfl: 1 .  Multiple Vitamins-Minerals (CENTRUM SILVER ADULT 50+) TABS, Take 1 tablet by mouth daily., Disp: , Rfl:  .  pravastatin (PRAVACHOL) 40 MG tablet, TAKE 1 TABLET (40 MG TOTAL) AT BEDTIME BY MOUTH., Disp: 90 tablet, Rfl: 3 .  quinapril (ACCUPRIL) 20 MG tablet, Take 1 tablet (20 mg total) by mouth daily., Disp: 90 tablet, Rfl: 3  Review of Systems  Constitutional: Negative.   HENT: Negative.   Eyes: Positive for itching.  Respiratory: Negative.   Cardiovascular: Negative.   Gastrointestinal: Negative.   Endocrine: Negative.   Genitourinary: Negative.   Musculoskeletal: Negative.   Skin: Negative.   Allergic/Immunologic: Negative.   Neurological: Negative.   Hematological: Negative.   Psychiatric/Behavioral: Negative.    Social History   Tobacco Use  . Smoking status: Never Smoker  . Smokeless tobacco: Never Used  Substance Use Topics  . Alcohol use: Yes    Alcohol/week: 0.0 standard drinks    Comment: rare; during the holidays  Objective:   BP (!) 170/82 (BP Location: Right Arm, Patient Position: Sitting, Cuff Size: Normal)   Pulse (!) 50   Temp 97.6 F (36.4 C) (Oral)   Ht 5\' 8"  (1.727 m)   Wt 176 lb (79.8 kg)   SpO2 98%   BMI 26.76 kg/m  Vitals:   10/24/18 0955  BP: (!) 170/82  Pulse: (!) 50  Temp: 97.6 F (36.4 C)  TempSrc: Oral  SpO2: 98%  Weight: 176 lb (79.8 kg)  Height: 5\' 8"  (1.727 m)   Physical Exam Constitutional:      General: She is not in acute distress.    Appearance: She is well-developed.  HENT:     Head: Normocephalic and atraumatic.     Right Ear: Hearing normal.     Left Ear: Hearing normal.      Nose: Nose normal.  Eyes:     General: Lids are normal. No scleral icterus.       Right eye: No discharge.        Left eye: No discharge.     Conjunctiva/sclera: Conjunctivae normal.  Pulmonary:     Effort: Pulmonary effort is normal. No respiratory distress.  Musculoskeletal: Normal range of motion.  Skin:    Findings: Rash present. No lesion.  Neurological:     Mental Status: She is alert and oriented to person, place, and time.  Psychiatric:        Speech: Speech normal.        Behavior: Behavior normal.        Thought Content: Thought content normal.       Assessment & Plan    1. Rhus dermatitis Onset of an itchy rash on forearms and some itching of left eye lids on 10-19-18. States she remembers helping husband with some wood that had had poison ivy vines on it. Several pink pruritic lesions on both forearms today. OTC creams have been of little help. Will treat with a prednisone taper and recheck if no better in 6-7 days. - predniSONE (DELTASONE) 5 MG tablet; Taper dose by one tablet by mouth (6,5,4,3,2,1)  Dispense: 21 tablet; Refill: [place], [place]  [facility]

## 2018-11-13 DIAGNOSIS — Z4689 Encounter for fitting and adjustment of other specified devices: Secondary | ICD-10-CM | POA: Diagnosis not present

## 2018-11-13 DIAGNOSIS — N8110 Cystocele, unspecified: Secondary | ICD-10-CM | POA: Diagnosis not present

## 2018-11-19 ENCOUNTER — Ambulatory Visit: Admit: 2018-11-19 | Payer: Medicare Other | Admitting: Unknown Physician Specialty

## 2018-11-19 SURGERY — COLONOSCOPY WITH PROPOFOL
Anesthesia: General

## 2018-12-10 ENCOUNTER — Other Ambulatory Visit: Payer: Self-pay | Admitting: Family Medicine

## 2018-12-10 DIAGNOSIS — I10 Essential (primary) hypertension: Secondary | ICD-10-CM

## 2018-12-11 ENCOUNTER — Ambulatory Visit: Payer: Self-pay | Admitting: Family Medicine

## 2018-12-15 ENCOUNTER — Other Ambulatory Visit: Payer: Self-pay | Admitting: Family Medicine

## 2018-12-15 DIAGNOSIS — I10 Essential (primary) hypertension: Secondary | ICD-10-CM

## 2019-03-16 DIAGNOSIS — Z4689 Encounter for fitting and adjustment of other specified devices: Secondary | ICD-10-CM | POA: Diagnosis not present

## 2019-04-06 ENCOUNTER — Telehealth: Payer: Self-pay | Admitting: Family Medicine

## 2019-04-06 NOTE — Telephone Encounter (Signed)
OK to get labs in February with follow up visit after Annual Wellness Screening.

## 2019-04-06 NOTE — Telephone Encounter (Signed)
Advised 

## 2019-04-06 NOTE — Telephone Encounter (Signed)
Stacey Fitzgerald Her wellness is due in February.  Do you need to see her before

## 2019-04-06 NOTE — Telephone Encounter (Signed)
Pt wanting to know if she needs to have any labs done?  Unsure if Stacey Fitzgerald would suggest a visit and labs?  Please advise.  Thanks, [employer]

## 2019-04-23 NOTE — Telephone Encounter (Signed)
Please review

## 2019-04-23 NOTE — Telephone Encounter (Signed)
Stacey Fitzgerald with [facility] seen Mrs. Savasta seen last week QuantaFlo test which test for PVD left foot was 1.11 right was 0.49. no symptoms she recommends low dose Asprin.  FYI.  Con [place]

## 2019-04-23 NOTE — Telephone Encounter (Signed)
Proceed with enteric coated ASA 81 mg qd and be sure to keep appointment with Dr. Milinda Pointer (podiatrist) scheduled for 05-06-19. Be sure he sees these results.

## 2019-04-23 NOTE — Telephone Encounter (Signed)
Stacey Fitzgerald and patient

## 2019-05-06 ENCOUNTER — Ambulatory Visit: Payer: Medicare Other | Admitting: Podiatry

## 2019-05-06 ENCOUNTER — Encounter: Payer: Self-pay | Admitting: Podiatry

## 2019-05-06 ENCOUNTER — Other Ambulatory Visit: Payer: Self-pay

## 2019-05-06 DIAGNOSIS — Q828 Other specified congenital malformations of skin: Secondary | ICD-10-CM | POA: Diagnosis not present

## 2019-05-06 DIAGNOSIS — M7752 Other enthesopathy of left foot: Secondary | ICD-10-CM

## 2019-05-06 NOTE — Progress Notes (Signed)
She presents today with a chief complaint of a painful lesion sub-fifth metatarsal phalangeal joint left foot.  She denies any trauma or any foreign body.  States that she has been doing good for the past year.  Objective: Vital signs are stable alert and oriented x3 pulses are strong palpable bilateral.  Evaluation of the fifth metatarsal phalangeal joint reveals porokeratotic lesion overlying a bursa.  Assessment: Bursitis sub-fifth left with overlying reactive hyperkeratotic lesion  Plan: After sterile Betadine skin prep I injected 2 mg of dexamethasone and local anesthetic.  Tolerated procedure well without complications also debrided the reactive hyperkeratotic lesion and I will follow-up with her on an as-needed basis.

## 2019-06-23 DIAGNOSIS — Z4689 Encounter for fitting and adjustment of other specified devices: Secondary | ICD-10-CM | POA: Diagnosis not present

## 2019-06-30 ENCOUNTER — Other Ambulatory Visit: Payer: Self-pay

## 2019-06-30 NOTE — Patient Outreach (Signed)
[facility]) Care Management  06/30/2019  Stacey Fitzgerald Dec 31, 1940 [id number]   Medication Adherence call to Stacey Fitzgerald [facility] Verify spoke with patient she is past due on Pravastatin 40 mg ,patient explain she takes 1 tablet daily,patient has medication at this time,last time fill was on 04/2019 for a 90 days supply.Stacey Fitzgerald is showing past due under [place].   [place] Management Direct Dial [phone]  Fax [phone] [email or web address]

## 2019-07-07 ENCOUNTER — Other Ambulatory Visit: Payer: Self-pay | Admitting: Family Medicine

## 2019-07-07 DIAGNOSIS — I10 Essential (primary) hypertension: Secondary | ICD-10-CM

## 2019-08-26 DIAGNOSIS — R112 Nausea with vomiting, unspecified: Secondary | ICD-10-CM | POA: Diagnosis not present

## 2019-08-26 DIAGNOSIS — Z800 Family history of malignant neoplasm of digestive organs: Secondary | ICD-10-CM | POA: Diagnosis not present

## 2019-08-26 DIAGNOSIS — Z9889 Other specified postprocedural states: Secondary | ICD-10-CM | POA: Diagnosis not present

## 2019-09-03 ENCOUNTER — Other Ambulatory Visit: Payer: Self-pay | Admitting: Obstetrics & Gynecology

## 2019-09-03 ENCOUNTER — Other Ambulatory Visit: Payer: Self-pay | Admitting: Family Medicine

## 2019-09-03 DIAGNOSIS — Z1231 Encounter for screening mammogram for malignant neoplasm of breast: Secondary | ICD-10-CM

## 2019-09-15 NOTE — Progress Notes (Addendum)
Subjective:   Stacey Fitzgerald is a 79 y.o. female who presents for Medicare Annual (Subsequent) preventive examination.    This visit is being conducted through telemedicine due to the COVID-19 pandemic. This patient has given me verbal consent via doximity to conduct this visit, patient states they are participating from their home address. Some vital signs may be absent or patient reported.    Patient identification: identified by name, DOB, and current address  Review of Systems:  N/A  Cardiac Risk Factors include: advanced age (>98men, >19 women);dyslipidemia;hypertension     Objective:     Vitals: There were no vitals taken for this visit.  There is no height or weight on file to calculate BMI. Unable to obtain vitals due to visit being conducted via telephonically.   Advanced Directives 09/16/2019 08/29/2018 08/13/2017 02/04/2015  Does Patient Have a Medical Advance Directive? Yes Yes Yes Yes  Type of Advance Directive Living will;Healthcare Power of [place];Living will Living will [place];Living will  Does patient want to make changes to medical advance directive? - - - No - Patient declined  Copy of [place] in Chart? No - copy requested No - copy requested - -    Tobacco Social History   Tobacco Use  Smoking Status Never Smoker  Smokeless Tobacco Never Used     Counseling given: Not Answered   Clinical Intake:  Pre-visit preparation completed: Yes  Pain : No/denies pain Pain Score: 0-No pain     Nutritional Risks: None Diabetes: No  How often do you need to have someone help you when you read instructions, pamphlets, or other written materials from your doctor or pharmacy?: 1 - Never  Interpreter Needed?: No  Information entered by :: [facility], LPN  Past Medical History:  Diagnosis Date   Arthritis    Female cystocele    Hyperlipidemia    Hypertension    Hypothyroidism    Menopausal state    Obesity, unspecified    Pessary maintenance    Rectocele    Past Surgical History:  Procedure Laterality Date   BREAST EXCISIONAL BIOPSY Left 1972   benign   BREAST SURGERY     benign cyst   COLONOSCOPY     miscarrage     TUBAL LIGATION     uterine pessary     Family History  Problem Relation Age of Onset   Diabetes Mother    Seizures Mother    CVA Father    Dementia Brother    Diabetes Maternal Grandfather    Mental illness Sister    Breast cancer Neg Hx    Social History   Socioeconomic History   Marital status: Married    Spouse name: Not on file   Number of children: 2   Years of education: Not on file   Highest education level: 12th grade  Occupational History   Occupation: retired  Tobacco Use   Smoking status: Never Smoker   Smokeless tobacco: Never Used  Substance and Sexual Activity   Alcohol use: Not Currently    Alcohol/week: 0.0 standard drinks   Drug use: No   Sexual activity: Not on file  Other Topics Concern   Not on file  Social History Narrative   Not on file   Social Determinants of Health   Financial Resource Strain: Low Risk    Difficulty of Paying Living Expenses: Not hard at all  Food Insecurity: No Food Insecurity  Worried About Charity fundraiser in the Last Year: Never true   [place] in the Last Year: Never true  Transportation Needs: No Transportation Needs   Lack of Transportation (Medical): No   Lack of Transportation (Non-Medical): No  Physical Activity: Inactive   Days of Exercise per Week: 0 days   Minutes of Exercise per Session: 0 min  Stress: No Stress Concern Present   Feeling of Stress : Not at all  Social Connections: Slightly Isolated   Frequency of Communication with Friends and Family: More than three times a week   Frequency of Social Gatherings with Friends and Family: Twice a week   Attends Religious Services: More than 4 times per year   Active Member of [employer] or  Organizations: No   Attends Archivist Meetings: Never   Marital Status: Married    Outpatient Encounter Medications as of 09/16/2019  Medication Sig   Acetaminophen (TYLENOL 8 HOUR ARTHRITIS PAIN PO) Take by mouth as needed.   hydrochlorothiazide (HYDRODIURIL) 12.5 MG tablet TAKE 1 TABLET BY MOUTH EVERY DAY   ibuprofen (ADVIL,MOTRIN) 200 MG tablet Take 200 mg by mouth every 6 (six) hours as needed.   levothyroxine (SYNTHROID, LEVOTHROID) 50 MCG tablet TAKE 1 TABLET (50 MCG TOTAL) BY MOUTH DAILY.   pravastatin (PRAVACHOL) 40 MG tablet TAKE 1 TABLET (40 MG TOTAL) AT BEDTIME BY MOUTH.   quinapril (ACCUPRIL) 20 MG tablet TAKE 1 TABLET BY MOUTH EVERY DAY   meloxicam (MOBIC) 15 MG tablet TAKE 1 TABLET BY MOUTH EVERY DAY (Patient not taking: As needed)   Multiple Vitamins-Minerals (CENTRUM SILVER ADULT 50+) TABS Take 1 tablet by mouth daily.   predniSONE (DELTASONE) 5 MG tablet Taper dose by one tablet by mouth (6,5,4,3,2,1) (Patient not taking: Reported on 09/16/2019)   triamcinolone lotion (KENALOG) 0.1 % APPLY TO AFFECTED AREA 3 TIMES A DAY   No facility-administered encounter medications on file as of 09/16/2019.    Activities of Daily Living In your present state of health, do you have any difficulty performing the following activities: 09/16/2019  Hearing? N  Vision? N  Difficulty concentrating or making decisions? N  Walking or climbing stairs? N  Dressing or bathing? N  Doing errands, shopping? N  Using the Toilet? N  In the past six months, have you accidently leaked urine? N  Do you have problems with loss of bowel control? N  Managing your Medications? N  Managing your Finances? N  Housekeeping or managing your Housekeeping? N  Some recent data might be hidden    Patient Care Team: Chrismon, Vickki Muff, PA as PCP - General (Physician Assistant) Ward, Honor Loh, MD as Referring Physician (Obstetrics and Gynecology)    Assessment:   This is a routine wellness  examination for [place].  Exercise Activities and Dietary recommendations Current Exercise Habits: The patient does not participate in regular exercise at present, Exercise limited by: None identified  Goals      DIET - INCREASE WATER INTAKE     Recommend to start drinking at least 2-4 glasses of water a day.   08/29/18: Recommend to continue increasing water intake to at least 6-8 8oz glasses of water per day.         Fall Risk: Fall Risk  09/16/2019 10/24/2018 08/29/2018 05/30/2018 08/13/2017  Falls in the past year? 0 0 0 0 No  Number falls in past yr: 0 - - - -  Injury with Fall? 0 - - - -  Follow up - - - - -    FALL RISK PREVENTION PERTAINING TO THE HOME:  Any stairs in or around the home? Yes  If so, are there any without handrails? No   Home free of loose throw rugs in walkways, pet beds, electrical cords, etc? Yes  Adequate lighting in your home to reduce risk of falls? Yes   ASSISTIVE DEVICES UTILIZED TO PREVENT FALLS:  Life alert? No  Use of a cane, walker or w/c? No  Grab bars in the bathroom? Yes  Shower chair or bench in shower? Yes  Elevated toilet seat or a handicapped toilet? Yes    TIMED UP AND GO:  Was the test performed? No .    Depression Screen PHQ 2/9 Scores 10/24/2018 08/29/2018 05/30/2018 08/13/2017  PHQ - 2 Score 0 0 0 0  PHQ- 9 Score - - - -     Cognitive Function: Declined today.         There is no immunization history on file for this patient.  Qualifies for Shingles Vaccine? Yes . Due for Shingrix. Pt has been advised to call insurance company to determine out of pocket expense. Advised may also receive vaccine at local pharmacy or Health Dept. Verbalized acceptance and understanding.  Tdap: Although this vaccine is not a covered service during a Wellness Exam, does the patient still wish to receive this vaccine today?  No . Advised may receive this vaccine at local pharmacy or Health Dept. Aware to provide a copy of the vaccination  record if obtained from local pharmacy or Health Dept. Verbalized acceptance and understanding.  Flu Vaccine: Due for Flu vaccine. Does the patient want to receive this vaccine today?  No . Advised may receive this vaccine at local pharmacy or Health Dept. Aware to provide a copy of the vaccination record if obtained from local pharmacy or Health Dept. Verbalized acceptance and understanding.  Pneumococcal Vaccine: Due for Pneumococcal vaccine. Does the patient want to receive this vaccine today?  No . Advised may receive this vaccine at local pharmacy or Health Dept. Aware to provide a copy of the vaccination record if obtained from local pharmacy or Health Dept. Verbalized acceptance and understanding.   Screening Tests Health Maintenance  Topic Date Due   COLONOSCOPY  01/09/2017   INFLUENZA VACCINE  10/21/2019 (Originally 02/21/2019)   TETANUS/TDAP  09/15/2020 (Originally 10/10/1959)   PNA vac Low Risk Adult (1 of 2 - PCV13) 09/15/2020 (Originally 10/11/2006)   DEXA SCAN  02/14/2021    Cancer Screenings:  Colorectal Screening: Completed 01/10/12. Repeat every 5 years. Colonoscopy scheduled for 11/2019.  Mammogram: No longer required.   Bone Density: Completed 02/15/16. Results reflect OSTEOPENIA. Repeat every 5 years.   Lung Cancer Screening: (Low Dose CT Chest recommended if Age 12-80 years, 30 pack-year currently smoking OR have quit w/in 15years.) does not qualify.   Additional Screening:  Vision Screening: Recommended annual ophthalmology exams for early detection of glaucoma and other disorders of the eye.  Dental Screening: Recommended annual dental exams for proper oral hygiene  Community Resource Referral:  CRR required this visit?  No       Plan:  I have personally reviewed and addressed the Medicare Annual Wellness questionnaire and have noted the following in the patient's chart:  A. Medical and social history B. Use of alcohol, tobacco or illicit drugs  C. Current  medications and supplements D. Functional ability and status E.  Nutritional status F.  Physical activity G. Advance directives  H. List of other physicians I.  Hospitalizations, surgeries, and ER visits in previous 12 months J.  [place] such as hearing and vision if needed, cognitive and depression L. Referrals and appointments   In addition, I have reviewed and discussed with patient certain preventive protocols, quality metrics, and best practice recommendations. A written personalized care plan for preventive services as well as general preventive health recommendations were provided to patient. Nurse Health Advisor  Signed,    Rylan Bernard [place], [place]  [id number] Nurse Health Advisor   Nurse Notes: Pt declined receiving a future influenza and pneumonia vaccine. Colonoscopy is scheduled for 11/2019.  Reviewed note and plan of Nurse Health Advisor. Was available for consultation today. Agree with documentation and recommendations.

## 2019-09-16 ENCOUNTER — Other Ambulatory Visit: Payer: Self-pay

## 2019-09-16 ENCOUNTER — Ambulatory Visit (INDEPENDENT_AMBULATORY_CARE_PROVIDER_SITE_OTHER): Payer: Medicare Other

## 2019-09-16 DIAGNOSIS — Z0000 Encounter for general adult medical examination without abnormal findings: Secondary | ICD-10-CM

## 2019-09-16 NOTE — Patient Instructions (Signed)
Stacey Fitzgerald , Thank you for taking time to come for your Medicare Wellness Visit. I appreciate your ongoing commitment to your health goals. Please review the following plan we discussed and let me know if I can assist you in the future.   Screening recommendations/referrals: Colonoscopy: Currently due. Scheduled for 11/2019. Mammogram: No longer required.  Bone Density: Up to date, due 01/2021 Recommended yearly ophthalmology/optometry visit for glaucoma screening and checkup Recommended yearly dental visit for hygiene and checkup  Vaccinations: Influenza vaccine: Pt declines today.  Pneumococcal vaccine: Pt declines today.  Tdap vaccine: Pt declines today.  Shingles vaccine: Pt declines today.     Advanced directives: Please bring a copy of your POA (Power of Attorney) and/or Living Will to your next appointment.   Conditions/risks identified: Please bring a copy of your POA (Power of Attorney) and/or Living Will to your next appointment.   Next appointment: 09/24/19 @ 10:40 AM with Vernie Murders. Declined scheduling an AWV for 2022 at this time.   Preventive Care 56 Years and Older, Female Preventive care refers to lifestyle choices and visits with your health care provider that can promote health and wellness. What does preventive care include?  A yearly physical exam. This is also called an annual well check.  Dental exams once or twice a year.  Routine eye exams. Ask your health care provider how often you should have your eyes checked.  Personal lifestyle choices, including:  Daily care of your teeth and gums.  Regular physical activity.  Eating a healthy diet.  Avoiding tobacco and drug use.  Limiting alcohol use.  Practicing safe sex.  Taking low-dose aspirin every day.  Taking vitamin and mineral supplements as recommended by your health care provider. What happens during an annual well check? The services and screenings done by your health care provider  during your annual well check will depend on your age, overall health, lifestyle risk factors, and family history of disease. Counseling  Your health care provider may ask you questions about your:  Alcohol use.  Tobacco use.  Drug use.  Emotional well-being.  Home and relationship well-being.  Sexual activity.  Eating habits.  History of falls.  Memory and ability to understand (cognition).  Work and work Statistician.  Reproductive health. Screening  You may have the following tests or measurements:  Height, weight, and BMI.  Blood pressure.  Lipid and cholesterol levels. These may be checked every 5 years, or more frequently if you are over 86 years old.  Skin check.  Lung cancer screening. You may have this screening every year starting at age 36 if you have a 30-pack-year history of smoking and currently smoke or have quit within the past 15 years.  Fecal occult blood test (FOBT) of the stool. You may have this test every year starting at age 40.  Flexible sigmoidoscopy or colonoscopy. You may have a sigmoidoscopy every 5 years or a colonoscopy every 10 years starting at age 76.  Hepatitis C blood test.  Hepatitis B blood test.  Sexually transmitted disease (STD) testing.  Diabetes screening. This is done by checking your blood sugar (glucose) after you have not eaten for a while (fasting). You may have this done every 1-3 years.  Bone density scan. This is done to screen for osteoporosis. You may have this done starting at age 5.  Mammogram. This may be done every 1-2 years. Talk to your health care provider about how often you should have regular mammograms. Talk with your  health care provider about your test results, treatment options, and if necessary, the need for more tests. Vaccines  Your health care provider may recommend certain vaccines, such as:  Influenza vaccine. This is recommended every year.  Tetanus, diphtheria, and acellular pertussis  (Tdap, Td) vaccine. You may need a Td booster every 10 years.  Zoster vaccine. You may need this after age 18.  Pneumococcal 13-valent conjugate (PCV13) vaccine. One dose is recommended after age 9.  Pneumococcal polysaccharide (PPSV23) vaccine. One dose is recommended after age 70. Talk to your health care provider about which screenings and vaccines you need and how often you need them. This information is not intended to replace advice given to you by your health care provider. Make sure you discuss any questions you have with your health care provider. Document Released: 08/05/2015 Document Revised: 03/28/2016 Document Reviewed: 05/10/2015 Elsevier Interactive Patient Education  2017 [place] Prevention in the Home Falls can cause injuries. They can happen to people of all ages. There are many things you can do to make your home safe and to help prevent falls. What can I do on the outside of my home?  Regularly fix the edges of walkways and driveways and fix any cracks.  Remove anything that might make you trip as you walk through a door, such as a raised step or threshold.  Trim any bushes or trees on the path to your home.  Use bright outdoor lighting.  Clear any walking paths of anything that might make someone trip, such as rocks or tools.  Regularly check to see if handrails are loose or broken. Make sure that both sides of any steps have handrails.  Any raised decks and porches should have guardrails on the edges.  Have any leaves, snow, or ice cleared regularly.  Use sand or salt on walking paths during winter.  Clean up any spills in your garage right away. This includes oil or grease spills. What can I do in the bathroom?  Use night lights.  Install grab bars by the toilet and in the tub and shower. Do not use towel bars as grab bars.  Use non-skid mats or decals in the tub or shower.  If you need to sit down in the shower, use a plastic, non-slip  stool.  Keep the floor dry. Clean up any water that spills on the floor as soon as it happens.  Remove soap buildup in the tub or shower regularly.  Attach bath mats securely with double-sided non-slip rug tape.  Do not have throw rugs and other things on the floor that can make you trip. What can I do in the bedroom?  Use night lights.  Make sure that you have a light by your bed that is easy to reach.  Do not use any sheets or blankets that are too big for your bed. They should not hang down onto the floor.  Have a firm chair that has side arms. You can use this for support while you get dressed.  Do not have throw rugs and other things on the floor that can make you trip. What can I do in the kitchen?  Clean up any spills right away.  Avoid walking on wet floors.  Keep items that you use a lot in easy-to-reach places.  If you need to reach something above you, use a strong step stool that has a grab bar.  Keep electrical cords out of the way.  Do not use  floor polish or wax that makes floors slippery. If you must use wax, use non-skid floor wax.  Do not have throw rugs and other things on the floor that can make you trip. What can I do with my stairs?  Do not leave any items on the stairs.  Make sure that there are handrails on both sides of the stairs and use them. Fix handrails that are broken or loose. Make sure that handrails are as long as the stairways.  Check any carpeting to make sure that it is firmly attached to the stairs. Fix any carpet that is loose or worn.  Avoid having throw rugs at the top or bottom of the stairs. If you do have throw rugs, attach them to the floor with carpet tape.  Make sure that you have a light switch at the top of the stairs and the bottom of the stairs. If you do not have them, ask someone to add them for you. What else can I do to help prevent falls?  Wear shoes that:  Do not have high heels.  Have rubber bottoms.  Are  comfortable and fit you well.  Are closed at the toe. Do not wear sandals.  If you use a stepladder:  Make sure that it is fully opened. Do not climb a closed stepladder.  Make sure that both sides of the stepladder are locked into place.  Ask someone to hold it for you, if possible.  Clearly mark and make sure that you can see:  Any grab bars or handrails.  First and last steps.  Where the edge of each step is.  Use tools that help you move around (mobility aids) if they are needed. These include:  Canes.  Walkers.  Scooters.  Crutches.  Turn on the lights when you go into a dark area. Replace any light bulbs as soon as they burn out.  Set up your furniture so you have a clear path. Avoid moving your furniture around.  If any of your floors are uneven, fix them.  If there are any pets around you, be aware of where they are.  Review your medicines with your doctor. Some medicines can make you feel dizzy. This can increase your chance of falling. Ask your doctor what other things that you can do to help prevent falls. This information is not intended to replace advice given to you by your health care provider. Make sure you discuss any questions you have with your health care provider. Document Released: 05/05/2009 Document Revised: 12/15/2015 Document Reviewed: 08/13/2014 Elsevier Interactive Patient Education  2017 [employer].

## 2019-09-23 NOTE — Progress Notes (Signed)
Patient: Stacey Fitzgerald, Female    DOB: April 25, 1941, 79 y.o.   MRN: [id number] Visit Date: 09/24/2019  Today's Provider: Vernie Murders, PA   Chief Complaint  Patient presents with  . Annual Exam   Subjective:     Complete Physical Stacey Fitzgerald is a 79 y.o. female. She feels well. She reports exercising daily. She reports she is sleeping fairly well.  ----------------------------------------------------------- Colonoscopy scheduled for 11/23/2019  Review of Systems  Constitutional: Negative for chills, fatigue and fever.  HENT: Negative for congestion, ear pain, rhinorrhea, sneezing and sore throat.   Eyes: Negative.  Negative for pain and redness.  Respiratory: Negative for cough, shortness of breath and wheezing.   Cardiovascular: Negative for chest pain and leg swelling.  Gastrointestinal: Negative for abdominal pain, blood in stool, constipation, diarrhea and nausea.  Endocrine: Negative for polydipsia and polyphagia.  Genitourinary: Negative.  Negative for dysuria, flank pain, hematuria, pelvic pain, vaginal bleeding and vaginal discharge.  Musculoskeletal: Negative for arthralgias, back pain, gait problem and joint swelling.  Skin: Negative for rash.  Neurological: Negative.  Negative for dizziness, tremors, seizures, weakness, light-headedness, numbness and headaches.  Hematological: Negative for adenopathy.  Psychiatric/Behavioral: Negative.  Negative for behavioral problems, confusion and dysphoric mood. The patient is not nervous/anxious and is not hyperactive.     Social History   Socioeconomic History  . Marital status: Married    Spouse name: Not on file  . Number of children: 2  . Years of education: Not on file  . Highest education level: 12th grade  Occupational History  . Occupation: retired  Tobacco Use  . Smoking status: Never Smoker  . Smokeless tobacco: Never Used  Substance and Sexual Activity  . Alcohol use: Not Currently   Alcohol/week: 0.0 standard drinks  . Drug use: No  . Sexual activity: Not on file  Other Topics Concern  . Not on file  Social History Narrative  . Not on file   Social Determinants of Health   Financial Resource Strain: Low Risk   . Difficulty of Paying Living Expenses: Not hard at all  Food Insecurity: No Food Insecurity  . Worried About Charity fundraiser in the Last Year: Never true  . Ran Out of Food in the Last Year: Never true  Transportation Needs: No Transportation Needs  . Lack of Transportation (Medical): No  . Lack of Transportation (Non-Medical): No  Physical Activity: Inactive  . Days of Exercise per Week: 0 days  . Minutes of Exercise per Session: 0 min  Stress: No Stress Concern Present  . Feeling of Stress : Not at all  Social Connections: Slightly Isolated  . Frequency of Communication with Friends and Family: More than three times a week  . Frequency of Social Gatherings with Friends and Family: Twice a week  . Attends Religious Services: More than 4 times per year  . Active Member of Clubs or Organizations: No  . Attends Archivist Meetings: Never  . Marital Status: Married  Human resources officer Violence: Not At Risk  . Fear of Current or Ex-Partner: No  . Emotionally Abused: No  . Physically Abused: No  . Sexually Abused: No    Past Medical History:  Diagnosis Date  . Arthritis   . Female cystocele   . Hyperlipidemia   . Hypertension   . Hypothyroidism   . Menopausal state   . Obesity, unspecified   . Pessary maintenance   . Rectocele  Patient Active Problem List   Diagnosis Date Noted  . Abortion, spontaneous 02/02/2015  . Bacterial skin infection of leg 02/02/2015  . HLD (hyperlipidemia) 02/02/2015  . Pure hypercholesterolemia 01/28/2015  . Essential hypertension 01/28/2015  . Cellulitis 01/28/2015  . Hypothyroidism 01/28/2015  . Monilial intertrigo 01/28/2015  . Rhus dermatitis 01/28/2015  . Low back pain, episodic  01/28/2015  . Hernia, rectovaginal 10/20/2013  . Adiposity 10/20/2013  . Climacteric 10/20/2013  . BP (high blood pressure) 10/20/2013  . Hypercholesteremia 10/20/2013  . Cystocele with prolapse 10/20/2013  . Arthritis 10/20/2013    Past Surgical History:  Procedure Laterality Date  . BREAST EXCISIONAL BIOPSY Left 1972   benign  . BREAST SURGERY     benign cyst  . COLONOSCOPY    . miscarrage    . TUBAL LIGATION    . uterine pessary      Her family history includes CVA in her father; Dementia in her brother; Diabetes in her maternal grandfather and mother; Mental illness in her sister; Seizures in her mother. There is no history of Breast cancer.   Current Outpatient Medications:  .  Acetaminophen (TYLENOL 8 HOUR ARTHRITIS PAIN PO), Take by mouth as needed., Disp: , Rfl:  .  hydrochlorothiazide (HYDRODIURIL) 12.5 MG tablet, TAKE 1 TABLET BY MOUTH EVERY DAY, Disp: 90 tablet, Rfl: 3 .  levothyroxine (SYNTHROID, LEVOTHROID) 50 MCG tablet, TAKE 1 TABLET (50 MCG TOTAL) BY MOUTH DAILY., Disp: 90 tablet, Rfl: 3 .  Multiple Vitamins-Minerals (CENTRUM SILVER ADULT 50+) TABS, Take 1 tablet by mouth daily., Disp: , Rfl:  .  pravastatin (PRAVACHOL) 40 MG tablet, TAKE 1 TABLET (40 MG TOTAL) AT BEDTIME BY MOUTH., Disp: 90 tablet, Rfl: 3 .  quinapril (ACCUPRIL) 20 MG tablet, TAKE 1 TABLET BY MOUTH EVERY DAY, Disp: 90 tablet, Rfl: 0 .  triamcinolone lotion (KENALOG) 0.1 %, APPLY TO AFFECTED AREA 3 TIMES A DAY, Disp: , Rfl:   Patient Care Team: Angel Weedon, Vickki Muff, PA as PCP - General (Physician Assistant) Ward, Honor Loh, MD as Referring Physician (Obstetrics and Gynecology)     Objective:    Vitals: BP 130/76 (BP Location: Right Arm, Patient Position: Sitting, Cuff Size: Large)   Pulse 93   Temp (!) 97.5 F (36.4 C) (Temporal)   Resp 16   Ht 5' 7.75" (1.721 m)   Wt 165 lb (74.8 kg)   SpO2 97% Comment: room air  BMI 25.27 kg/m   Physical Exam Constitutional:      Appearance: She  is well-developed.  HENT:     Head: Normocephalic and atraumatic.     Right Ear: External ear normal.     Left Ear: External ear normal.     Nose: Nose normal.  Eyes:     General:        Right eye: No discharge.     Conjunctiva/sclera: Conjunctivae normal.     Pupils: Pupils are equal, round, and reactive to light.  Neck:     Thyroid: No thyromegaly.     Trachea: No tracheal deviation.  Cardiovascular:     Rate and Rhythm: Normal rate and regular rhythm.     Heart sounds: Normal heart sounds. No murmur.  Pulmonary:     Effort: Pulmonary effort is normal. No respiratory distress.     Breath sounds: Normal breath sounds. No wheezing or rales.  Chest:     Chest wall: No tenderness.  Abdominal:     General: There is no distension.  Palpations: Abdomen is soft. There is no mass.     Tenderness: There is no abdominal tenderness. There is no guarding or rebound.  Genitourinary:    Comments: Deferred to GYN and planning pessary maintenance in 4 months. Musculoskeletal:        General: No tenderness.     Cervical back: Normal range of motion and neck supple.     Comments: Fair ROM throughout with dorsal kyphosis. No back pain of significance. No neuropathy or other joint deformities. Good pulses throughout.  Lymphadenopathy:     Cervical: No cervical adenopathy.  Skin:    General: Skin is warm and dry.     Findings: No erythema or rash.  Neurological:     Mental Status: She is alert and oriented to person, place, and time.     Cranial Nerves: No cranial nerve deficit.     Motor: No abnormal muscle tone.     Coordination: Coordination normal.     Deep Tendon Reflexes: Reflexes are normal and symmetric. Reflexes normal.  Psychiatric:        Behavior: Behavior normal.        Thought Content: Thought content normal.        Judgment: Judgment normal.     Activities of Daily Living In your present state of health, do you have any difficulty performing the following activities:  09/16/2019  Hearing? N  Vision? N  Difficulty concentrating or making decisions? N  Walking or climbing stairs? N  Dressing or bathing? N  Doing errands, shopping? N  Using the Toilet? N  In the past six months, have you accidently leaked urine? N  Do you have problems with loss of bowel control? N  Managing your Medications? N  Managing your Finances? N  Housekeeping or managing your Housekeeping? N  Some recent data might be hidden    Fall Risk Assessment Fall Risk  09/16/2019 10/24/2018 08/29/2018 05/30/2018 08/13/2017  Falls in the past year? 0 0 0 0 No  Number falls in past yr: 0 - - - -  Injury with Fall? 0 - - - -  Follow up - - - - -     Depression Screen PHQ 2/9 Scores 10/24/2018 08/29/2018 05/30/2018 08/13/2017  PHQ - 2 Score 0 0 0 0  PHQ- 9 Score - - - -     Assessment & Plan:    Annual Physical Reviewed patient's Family Medical History Reviewed and updated list of patient's medical providers Assessment of cognitive impairment was done Assessed patient's functional ability Established a written schedule for health screening [place] Completed and Reviewed  Exercise Activities and Dietary recommendations Goals    . DIET - INCREASE WATER INTAKE     Recommend to start drinking at least 2-4 glasses of water a day.   08/29/18: Recommend to continue increasing water intake to at least 6-8 8oz glasses of water per day.         There is no immunization history on file for this patient.  Health Maintenance  Topic Date Due  . COLONOSCOPY  01/09/2017  . INFLUENZA VACCINE  10/21/2019 (Originally 02/21/2019)  . TETANUS/TDAP  09/15/2020 (Originally 10/10/1960)  . PNA vac Low Risk Adult (1 of 2 - PCV13) 09/15/2020 (Originally 10/11/2006)  . DEXA SCAN  02/14/2021     Discussed health benefits of physical activity, and encouraged her to engage in regular exercise appropriate for her age and condition.      ------------------------------------------------------------------------------------------------------------ 1. Pure hypercholesterolemia Tolerating  Pravastatin 40 mg qd without side effects. Last lipid panel on 09-11-18 showed total cholesterol 175, triglycerides 161, HDL 44 and LDL 99. Will continue to work on low fat diet. BMI 25 today. Recheck labs and follow up pending reports. - Comprehensive metabolic panel - Lipid panel  2. Hypothyroidism, unspecified type Still taking Levothyroxine 50 mcg qd. No tremor, tachycardia, exophthalmos, thyroid nodules or hyperreflexia. Recheck labs and follow up pending reports. - CBC with Differential/Platelet - Comprehensive metabolic panel - TSH - levothyroxine (SYNTHROID) 50 MCG tablet; Take 1 tablet (50 mcg total) by mouth daily.  Dispense: 90 tablet; Refill: 3  3. Essential hypertension Tolerating HCTZ 12.5 mg qd with Quinapril 20 mg qd without chest pains, dyspnea, hives or edema. Will get follow up labs and refill Quinapril. - CBC with Differential/Platelet - Comprehensive metabolic panel - quinapril (ACCUPRIL) 20 MG tablet; Take 1 tablet (20 mg total) by mouth daily.  Dispense: 90 tablet; Refill: 3  4. Annual physical exam General health stable. Refuses flu and pneumonia vaccinations - got both COVID vaccinations (08-04-19 and 09-01-19). Will have last screening colonoscopy 11-23-19 with Dr. Alice Reichert (GI). No longer required to get mammograms. Given anticipatory counseling. Last GYN follow up with Dr. Leonides Schanz was accomplished on 06-23-19 regarding per pessary maintenance.     Vernie Murders, PA  [facility]

## 2019-09-24 ENCOUNTER — Ambulatory Visit (INDEPENDENT_AMBULATORY_CARE_PROVIDER_SITE_OTHER): Payer: Medicare Other | Admitting: Family Medicine

## 2019-09-24 ENCOUNTER — Encounter: Payer: Self-pay | Admitting: Family Medicine

## 2019-09-24 ENCOUNTER — Other Ambulatory Visit: Payer: Self-pay

## 2019-09-24 VITALS — BP 130/76 | HR 93 | Temp 97.5°F | Resp 16 | Ht 67.75 in | Wt 165.0 lb

## 2019-09-24 DIAGNOSIS — Z0000 Encounter for general adult medical examination without abnormal findings: Secondary | ICD-10-CM | POA: Diagnosis not present

## 2019-09-24 DIAGNOSIS — I10 Essential (primary) hypertension: Secondary | ICD-10-CM | POA: Diagnosis not present

## 2019-09-24 DIAGNOSIS — E039 Hypothyroidism, unspecified: Secondary | ICD-10-CM | POA: Diagnosis not present

## 2019-09-24 DIAGNOSIS — E7800 Pure hypercholesterolemia, unspecified: Secondary | ICD-10-CM

## 2019-09-24 MED ORDER — LEVOTHYROXINE SODIUM 50 MCG PO TABS: 50.0000 ug | ORAL_TABLET | Freq: Every day | ORAL | 3 refills | Status: DC

## 2019-09-24 MED ORDER — QUINAPRIL HCL 20 MG PO TABS
20.0000 mg | ORAL_TABLET | Freq: Every day | ORAL | 3 refills | Status: DC
Start: 2019-09-24 — End: 2021-03-11

## 2019-10-15 ENCOUNTER — Ambulatory Visit
Admission: RE | Admit: 2019-10-15 | Discharge: 2019-10-15 | Disposition: A | Discharge status: Home / Self Care | Payer: Medicare Other | Source: Ambulatory Visit | Attending: Obstetrics & Gynecology | Admitting: Obstetrics & Gynecology

## 2019-10-15 DIAGNOSIS — Z1231 Encounter for screening mammogram for malignant neoplasm of breast: Secondary | ICD-10-CM | POA: Diagnosis not present

## 2019-10-22 DIAGNOSIS — E039 Hypothyroidism, unspecified: Secondary | ICD-10-CM | POA: Diagnosis not present

## 2019-10-22 DIAGNOSIS — E7800 Pure hypercholesterolemia, unspecified: Secondary | ICD-10-CM | POA: Diagnosis not present

## 2019-10-22 DIAGNOSIS — I10 Essential (primary) hypertension: Secondary | ICD-10-CM | POA: Diagnosis not present

## 2019-10-23 ENCOUNTER — Telehealth: Payer: Self-pay

## 2019-10-23 LAB — COMPREHENSIVE METABOLIC PANEL
ALT: 7 IU/L (ref 0–32)
AST: 9 IU/L (ref 0–40)
Albumin/Globulin Ratio: 1.7 (ref 1.2–2.2)
Albumin: 4.3 g/dL (ref 3.7–4.7)
Alkaline Phosphatase: 73 IU/L (ref 39–117)
BUN/Creatinine Ratio: 18 (ref 12–28)
BUN: 22 mg/dL (ref 8–27)
Bilirubin Total: 0.4 mg/dL (ref 0.0–1.2)
CO2: 22 mmol/L (ref 20–29)
Calcium: 9.7 mg/dL (ref 8.7–10.3)
Chloride: 103 mmol/L (ref 96–106)
Creatinine, Ser: 1.19 mg/dL — ABNORMAL HIGH (ref 0.57–1.00)
GFR calc Af Amer: 51 mL/min/{1.73_m2} — ABNORMAL LOW (ref >59)
GFR calc non Af Amer: 44 mL/min/{1.73_m2} — ABNORMAL LOW (ref >59)
Globulin, Total: 2.5 g/dL (ref 1.5–4.5)
Glucose: 94 mg/dL (ref 65–99)
Potassium: 4.2 mmol/L (ref 3.5–5.2)
Sodium: 141 mmol/L (ref 134–144)
Total Protein: 6.8 g/dL (ref 6.0–8.5)

## 2019-10-23 LAB — CBC WITH DIFFERENTIAL/PLATELET
Basophils Absolute: 0.1 10*3/uL (ref 0.0–0.2)
Basos: 1 %
EOS (ABSOLUTE): 0.1 10*3/uL (ref 0.0–0.4)
Eos: 1 %
Hematocrit: 38.7 % (ref 34.0–46.6)
Hemoglobin: 12.9 g/dL (ref 11.1–15.9)
Immature Grans (Abs): 0 10*3/uL (ref 0.0–0.1)
Immature Granulocytes: 0 %
Lymphocytes Absolute: 2.1 10*3/uL (ref 0.7–3.1)
Lymphs: 26 %
MCH: 29 pg (ref 26.6–33.0)
MCHC: 33.3 g/dL (ref 31.5–35.7)
MCV: 87 fL (ref 79–97)
Monocytes Absolute: 0.6 10*3/uL (ref 0.1–0.9)
Monocytes: 7 %
Neutrophils Absolute: 5.3 10*3/uL (ref 1.4–7.0)
Neutrophils: 65 %
Platelets: 298 10*3/uL (ref 150–450)
RBC: 4.45 x10E6/uL (ref 3.77–5.28)
RDW: 12.9 % (ref 11.7–15.4)
WBC: 8.1 10*3/uL (ref 3.4–10.8)

## 2019-10-23 LAB — LIPID PANEL
Chol/HDL Ratio: 4.4 ratio (ref 0.0–4.4)
Cholesterol, Total: 191 mg/dL (ref 100–199)
HDL: 43 mg/dL (ref >39)
LDL Chol Calc (NIH): 119 mg/dL — ABNORMAL HIGH (ref 0–99)
Triglycerides: 163 mg/dL — ABNORMAL HIGH (ref 0–149)
VLDL Cholesterol Cal: 29 mg/dL (ref 5–40)

## 2019-10-23 LAB — TSH: TSH: 3.34 u[IU]/mL (ref 0.450–4.500)

## 2019-10-23 NOTE — Telephone Encounter (Signed)
Patient advised on labs.

## 2019-10-23 NOTE — Telephone Encounter (Signed)
-----   Message from [place], [place] sent at 10/23/2019  1:41 PM EDT ----- Labs essentially normal except slight decrease in GFR and slight increase in LDL cholesterol. Need to drink more water and continue the Pravastatin 40 mg qd #90 & 3 RF. Recheck labs in 3 months.

## 2019-11-01 ENCOUNTER — Other Ambulatory Visit: Payer: Self-pay | Admitting: Family Medicine

## 2019-11-01 DIAGNOSIS — E7800 Pure hypercholesterolemia, unspecified: Secondary | ICD-10-CM

## 2019-11-01 NOTE — Telephone Encounter (Signed)
Requested Prescriptions  Pending Prescriptions Disp Refills  . pravastatin (PRAVACHOL) 40 MG tablet [Pharmacy Med Name: PRAVASTATIN SODIUM 40 MG TAB] 90 tablet 3    Sig: TAKE 1 TABLET (40 MG TOTAL) AT BEDTIME BY MOUTH.     Cardiovascular:  Antilipid - Statins Failed - 11/01/2019 12:53 AM      Failed - LDL in normal range and within 360 days    LDL Cholesterol (Calc)  Date Value Ref Range Status  05/31/2017 151 (H) mg/dL (calc) Final    Comment:    Reference range: <100 . Desirable range <100 mg/dL for primary prevention;   <70 mg/dL for patients with CHD or diabetic patients  with > or = 2 CHD risk factors. Marland Kitchen LDL-C is now calculated using the Martin-Hopkins  calculation, which is a validated novel method providing  better accuracy than the Friedewald equation in the  estimation of LDL-C.  Cresenciano Genre et al. Annamaria Helling. [id number]): 2061-2068  (http://education.[facility].com/faq/FAQ164)    LDL Chol Calc (NIH)  Date Value Ref Range Status  10/22/2019 119 (H) 0 - 99 mg/dL Final         Failed - Triglycerides in normal range and within 360 days    Triglycerides  Date Value Ref Range Status  10/22/2019 163 (H) 0 - 149 mg/dL Final         Failed - Valid encounter within last 12 months    Recent Outpatient Visits          1 month ago Pure hypercholesterolemia   [facility], Vickki Muff, [place]   1 year ago Rhus dermatitis   [facility], Vickki Muff, [place]   1 year ago Pure hypercholesterolemia   [facility], [place], [place]   1 year ago Essential hypertension   [facility], Vickki Muff, [place]   1 year ago Essential hypertension   [facility], Vickki Muff, [place]             Passed - Total Cholesterol in normal range and within 360 days    Cholesterol, Total  Date Value Ref Range Status  10/22/2019 191 100 - 199 mg/dL Final         Passed - HDL in normal range and within 360  days    HDL  Date Value Ref Range Status  10/22/2019 43 >39 mg/dL Final         Passed - Patient is not pregnant      Patient was seen by Dr. Natale Milch one month ago - valid encounter.

## 2019-11-18 DIAGNOSIS — Z4689 Encounter for fitting and adjustment of other specified devices: Secondary | ICD-10-CM | POA: Diagnosis not present

## 2019-11-19 ENCOUNTER — Other Ambulatory Visit
Admission: RE | Admit: 2019-11-19 | Discharge: 2019-11-19 | Disposition: A | Discharge status: Home / Self Care | Payer: Medicare Other | Source: Ambulatory Visit | Attending: Internal Medicine | Admitting: Internal Medicine

## 2019-11-19 DIAGNOSIS — Z01812 Encounter for preprocedural laboratory examination: Secondary | ICD-10-CM | POA: Insufficient documentation

## 2019-11-19 DIAGNOSIS — Z20822 Contact with and (suspected) exposure to covid-19: Secondary | ICD-10-CM | POA: Diagnosis not present

## 2019-11-19 LAB — SARS CORONAVIRUS 2 (TAT 6-24 HRS): SARS Coronavirus 2: NEGATIVE

## 2019-11-23 ENCOUNTER — Encounter: Payer: Self-pay | Admitting: Internal Medicine

## 2019-11-23 ENCOUNTER — Other Ambulatory Visit: Payer: Self-pay

## 2019-11-23 ENCOUNTER — Ambulatory Visit: Payer: Medicare Other | Admitting: Anesthesiology

## 2019-11-23 ENCOUNTER — Encounter
Admission: RE | Disposition: A | Discharge status: Home / Self Care | Payer: Self-pay | Source: Home / Self Care | Attending: Internal Medicine

## 2019-11-23 ENCOUNTER — Ambulatory Visit
Admission: RE | Admit: 2019-11-23 | Discharge: 2019-11-23 | Disposition: A | Discharge status: Home / Self Care | Payer: Medicare Other | Attending: Internal Medicine | Admitting: Internal Medicine

## 2019-11-23 DIAGNOSIS — I10 Essential (primary) hypertension: Secondary | ICD-10-CM | POA: Insufficient documentation

## 2019-11-23 DIAGNOSIS — Z800 Family history of malignant neoplasm of digestive organs: Secondary | ICD-10-CM | POA: Insufficient documentation

## 2019-11-23 DIAGNOSIS — E669 Obesity, unspecified: Secondary | ICD-10-CM | POA: Insufficient documentation

## 2019-11-23 DIAGNOSIS — Z7689 Persons encountering health services in other specified circumstances: Secondary | ICD-10-CM | POA: Diagnosis not present

## 2019-11-23 DIAGNOSIS — Z86010 Personal history of colonic polyps: Secondary | ICD-10-CM | POA: Diagnosis not present

## 2019-11-23 DIAGNOSIS — Z1211 Encounter for screening for malignant neoplasm of colon: Secondary | ICD-10-CM | POA: Insufficient documentation

## 2019-11-23 DIAGNOSIS — D122 Benign neoplasm of ascending colon: Secondary | ICD-10-CM | POA: Insufficient documentation

## 2019-11-23 DIAGNOSIS — Z79899 Other long term (current) drug therapy: Secondary | ICD-10-CM | POA: Diagnosis not present

## 2019-11-23 DIAGNOSIS — E039 Hypothyroidism, unspecified: Secondary | ICD-10-CM | POA: Diagnosis not present

## 2019-11-23 DIAGNOSIS — K635 Polyp of colon: Secondary | ICD-10-CM | POA: Diagnosis not present

## 2019-11-23 DIAGNOSIS — Z79890 Hormone replacement therapy (postmenopausal): Secondary | ICD-10-CM | POA: Diagnosis not present

## 2019-11-23 DIAGNOSIS — E785 Hyperlipidemia, unspecified: Secondary | ICD-10-CM | POA: Insufficient documentation

## 2019-11-23 DIAGNOSIS — Z6825 Body mass index (BMI) 25.0-25.9, adult: Secondary | ICD-10-CM | POA: Diagnosis not present

## 2019-11-23 DIAGNOSIS — K648 Other hemorrhoids: Secondary | ICD-10-CM | POA: Diagnosis not present

## 2019-11-23 DIAGNOSIS — K5730 Diverticulosis of large intestine without perforation or abscess without bleeding: Secondary | ICD-10-CM | POA: Insufficient documentation

## 2019-11-23 DIAGNOSIS — K5790 Diverticulosis of intestine, part unspecified, without perforation or abscess without bleeding: Secondary | ICD-10-CM | POA: Diagnosis not present

## 2019-11-23 DIAGNOSIS — M1990 Unspecified osteoarthritis, unspecified site: Secondary | ICD-10-CM | POA: Insufficient documentation

## 2019-11-23 HISTORY — PX: COLONOSCOPY WITH PROPOFOL: SHX5780

## 2019-11-23 SURGERY — COLONOSCOPY WITH PROPOFOL
Anesthesia: General

## 2019-11-23 MED ORDER — FENTANYL CITRATE (PF) 100 MCG/2ML IJ SOLN
INTRAMUSCULAR | Status: AC
  Filled 2019-11-23: qty 2

## 2019-11-23 MED ORDER — FENTANYL CITRATE (PF) 100 MCG/2ML IJ SOLN
INTRAMUSCULAR | Status: DC | PRN
  Administered 2019-11-23: 50 ug via INTRAVENOUS

## 2019-11-23 MED ORDER — PROPOFOL 500 MG/50ML IV EMUL
INTRAVENOUS | Status: DC | PRN
  Administered 2019-11-23: 150 ug/kg/min via INTRAVENOUS

## 2019-11-23 MED ORDER — SPOT INK MARKER SYRINGE KIT
PACK | SUBMUCOSAL | Status: DC | PRN
  Administered 2019-11-23: 4 mL via SUBMUCOSAL

## 2019-11-23 MED ORDER — MIDAZOLAM HCL 2 MG/2ML IJ SOLN
INTRAMUSCULAR | Status: DC | PRN
  Administered 2019-11-23: 1 mg via INTRAVENOUS

## 2019-11-23 MED ORDER — PROPOFOL 500 MG/50ML IV EMUL
INTRAVENOUS | Status: AC
  Filled 2019-11-23: qty 50

## 2019-11-23 MED ORDER — SODIUM CHLORIDE 0.9 % IV SOLN: INTRAVENOUS | Status: DC

## 2019-11-23 MED ORDER — MIDAZOLAM HCL 2 MG/2ML IJ SOLN
INTRAMUSCULAR | Status: AC
  Filled 2019-11-23: qty 2

## 2019-11-23 NOTE — Anesthesia Preprocedure Evaluation (Addendum)
Anesthesia Evaluation  Patient identified by MRN, date of birth, ID band Patient awake    Reviewed: Allergy & Precautions, H&P , NPO status , reviewed documented beta blocker date and time   Airway Mallampati: II  TM Distance: >3 FB Neck ROM: limited    Dental  (+) Partial Upper, Partial Lower   Pulmonary    Pulmonary exam normal        Cardiovascular hypertension, Normal cardiovascular exam     Neuro/Psych    GI/Hepatic neg GERD  ,  Endo/Other  Hypothyroidism   Renal/GU      Musculoskeletal  (+) Arthritis ,   Abdominal   Peds  Hematology   Anesthesia Other Findings Past Medical History: No date: Arthritis No date: Female cystocele No date: Hyperlipidemia No date: Hypertension No date: Hypothyroidism No date: Menopausal state No date: Obesity, unspecified No date: Pessary maintenance No date: Rectocele  Past Surgical History: 1972: BREAST EXCISIONAL BIOPSY; Left     Comment:  benign No date: BREAST SURGERY     Comment:  benign cyst No date: COLONOSCOPY No date: miscarrage No date: TUBAL LIGATION No date: uterine pessary     Reproductive/Obstetrics                            Anesthesia Physical Anesthesia Plan  ASA: II  Anesthesia Plan: General   Post-op Pain Management:    Induction: Intravenous  PONV Risk Score and Plan: Treatment may vary due to age or medical condition and TIVA  Airway Management Planned: Nasal Cannula and Natural Airway  Additional Equipment:   Intra-op Plan:   Post-operative Plan:   Informed Consent: I have reviewed the patients History and Physical, chart, labs and discussed the procedure including the risks, benefits and alternatives for the proposed anesthesia with the patient or authorized representative who has indicated his/her understanding and acceptance.     Dental Advisory Given  Plan Discussed with: CRNA  Anesthesia Plan  Comments:        Anesthesia Quick Evaluation

## 2019-11-23 NOTE — Interval H&P Note (Signed)
History and Physical Interval Note:  11/23/2019 12:35 PM  Stacey Fitzgerald  has presented today for surgery, with the diagnosis of FH CCA.  The various methods of treatment have been discussed with the patient and family. After consideration of risks, benefits and other options for treatment, the patient has consented to  Procedure(s): COLONOSCOPY WITH PROPOFOL (N/A) as a surgical intervention.  The patient's history has been reviewed, patient examined, no change in status, stable for surgery.  I have reviewed the patient's chart and labs.  Questions were answered to the patient's satisfaction.     [place], [place]

## 2019-11-23 NOTE — Op Note (Addendum)
[facility] Gastroenterology Patient Name: Stacey Fitzgerald Procedure Date: 11/23/2019 1:38 PM MRN: [id number] Account #: [id number] Date of Birth: [birth year] Admit Type: Outpatient Age: 79 Room: [facility] ENDO ROOM 3 Gender: Female Note Status: Supervisor Override Procedure:             Colonoscopy Indications:           Family history of colon cancer Providers:             Benay Pike. Murel Shenberger MD, MD Medicines:             Propofol per Anesthesia Complications:         No immediate complications. Estimated blood loss:                         Minimal. Procedure:             Pre-Anesthesia Assessment:                        - The risks and benefits of the procedure and the                         sedation options and risks were discussed with the                         patient. All questions were answered and informed                         consent was obtained.                        - Patient identification and proposed procedure were                         verified prior to the procedure by the nurse. The                         procedure was verified in the procedure room.                        - ASA Grade Assessment: III - A patient with severe                         systemic disease.                        - After reviewing the risks and benefits, the patient                         was deemed in satisfactory condition to undergo the                         procedure.                        After obtaining informed consent, the colonoscope was                         passed under direct vision. Throughout the procedure,  the patient's blood pressure, pulse, and oxygen                         saturations were monitored continuously. The                         Colonoscope was introduced through the anus and                         advanced to the the cecum, identified by appendiceal                         orifice and ileocecal valve. The  patient tolerated the                         procedure well. The quality of the bowel preparation                         was good except the sigmoid colon was fair. The                         colonoscopy was technically difficult and complex due                         to multiple diverticula in the colon. Successful                         completion of the procedure was aided by applying                         abdominal pressure. Findings:      The perianal and digital rectal examinations were normal. Pertinent       negatives include normal sphincter tone and no palpable rectal lesions.      Multiple small and large-mouthed diverticula were found in the sigmoid       colon. There was no evidence of diverticular bleeding.      A 38 mm polyp was found in the ascending colon. The polyp was       carpet-like. Area was partially successfully injected with 18 mL Eleview       for a lift polypectomy. The polyp was removed with a hot snare at 15       watts. Polyp resection was incomplete. The resected tissue was retrieved       using a suction (via the working channel). Area was successfully       injected with 4 mL Spot (carbon black) for tattooing.      The exam was otherwise without abnormality.      Non-bleeding internal hemorrhoids were found during retroflexion. The       hemorrhoids were Grade I (internal hemorrhoids that do not prolapse). Impression:            - Severe diverticulosis in the sigmoid colon. There                         was no evidence of diverticular bleeding.                        - One 38 mm polyp in the ascending colon,  removed with                         a hot snare. Incomplete resection. Resected tissue                         retrieved. Injected.                        - The examination was otherwise normal. Recommendation:        - Patient has a contact number available for                         emergencies. The signs and symptoms of potential                          delayed complications were discussed with the patient.                         Return to normal activities tomorrow. Written                         discharge instructions were provided to the patient.                        - Resume previous diet.                        - Continue present medications.                        - Await pathology results.                        - Repeat colonoscopy date to be determined after                         pending pathology results are reviewed for                         surveillance of polyps greater than 1 cm in size.                        - Return to GI office PRN.                        - No aspirin, ibuprofen, naproxen, or other                         non-steroidal anti-inflammatory drugs for 2 weeks                         after polyp removal.                        - The findings and recommendations were discussed with                         the patient. Procedure Code(s):     --- Professional ---  45385, Colonoscopy, flexible; with removal of                         tumor(s), polyp(s), or other lesion(s) by snare                         technique                        45381, Colonoscopy, flexible; with directed submucosal                         injection(s), any substance Diagnosis Code(s):     --- Professional ---                        K57.30, Diverticulosis of large intestine without                         perforation or abscess without bleeding                        K63.5, Polyp of colon                        Z86.010, Personal history of colonic polyps CPT copyright 2019 American Medical Association. All rights reserved. The codes documented in this report are preliminary and upon coder review may  be revised to meet current compliance requirements. Efrain Sella MD, MD 11/23/2019 2:55:58 PM This report has been signed electronically. Number of Addenda: 0 Note Initiated On: 11/23/2019  1:38 PM Scope Withdrawal Time: 0 hours 36 minutes 51 seconds  Total Procedure Duration: 0 hours 45 minutes 57 seconds  Estimated Blood Loss:  Estimated blood loss was minimal. Estimated blood loss                         was minimal.      [facility]

## 2019-11-23 NOTE — Transfer of Care (Signed)
Immediate Anesthesia Transfer of Care Note  Patient: Stacey Fitzgerald  Procedure(s) Performed: COLONOSCOPY WITH PROPOFOL (N/A )  Patient Location: PACU  Anesthesia Type:General  Level of Consciousness: awake and sedated  Airway & Oxygen Therapy: Patient Spontanous Breathing and Patient connected to nasal cannula oxygen  Post-op Assessment: Report given to RN  Post vital signs: Reviewed and stable  Last Vitals:  Vitals Value Taken Time  BP    Temp    Pulse    Resp    SpO2      Last Pain:  Vitals:   11/23/19 1302  TempSrc: Temporal  PainSc: 0-No pain         Complications: No apparent anesthesia complications

## 2019-11-23 NOTE — H&P (Signed)
Outpatient short stay form Pre-procedure 11/23/2019 12:34 PM Duward Allbritton K. Alice Reichert, M.D.  Primary Physician: Tempie Hoist, PA-C  Reason for visit:  Family history of colon cancer (Brother).  History of present illness:   79 year old patient presenting for family history of colon cancer. Patient denies any change in bowel habits, rectal bleeding or involuntary weight loss. Last colonoscopy performed in 2013 showing diverticulosis and a small benign hyperplastic polyp only.     No current facility-administered medications for this encounter.  Medications Prior to Admission  Medication Sig Dispense Refill Last Dose  . Acetaminophen (TYLENOL 8 HOUR ARTHRITIS PAIN PO) Take by mouth as needed.     . hydrochlorothiazide (HYDRODIURIL) 12.5 MG tablet TAKE 1 TABLET BY MOUTH EVERY DAY 90 tablet 3   . levothyroxine (SYNTHROID) 50 MCG tablet Take 1 tablet (50 mcg total) by mouth daily. 90 tablet 3   . Multiple Vitamins-Minerals (CENTRUM SILVER ADULT 50+) TABS Take 1 tablet by mouth daily.     . pravastatin (PRAVACHOL) 40 MG tablet TAKE 1 TABLET (40 MG TOTAL) AT BEDTIME BY MOUTH. 90 tablet 3   . quinapril (ACCUPRIL) 20 MG tablet Take 1 tablet (20 mg total) by mouth daily. 90 tablet 3   . triamcinolone lotion (KENALOG) 0.1 % APPLY TO AFFECTED AREA 3 TIMES A DAY        Allergies  Allergen Reactions  . Cephalexin Hives     Past Medical History:  Diagnosis Date  . Arthritis   . Female cystocele   . Hyperlipidemia   . Hypertension   . Hypothyroidism   . Menopausal state   . Obesity, unspecified   . Pessary maintenance   . Rectocele     Review of systems:  Otherwise negative.    Physical Exam  Gen: Alert, oriented. Appears stated age.  HEENT: [place]/AT. PERRLA. Lungs: CTA, no wheezes. CV: RR nl S1, S2. Abd: soft, benign, no masses. BS+ Ext: No edema. Pulses 2+    Planned procedures: Proceed with colonoscopy. The patient understands the nature of the planned procedure, indications,  risks, alternatives and potential complications including but not limited to bleeding, infection, perforation, damage to internal organs and possible oversedation/side effects from anesthesia. The patient agrees and gives consent to proceed.  Please refer to procedure notes for findings, recommendations and patient disposition/instructions.     Edom Schmuhl K. Alice Reichert, M.D. Gastroenterology 11/23/2019  12:34 PM

## 2019-11-24 ENCOUNTER — Encounter: Payer: Self-pay | Admitting: *Deleted

## 2019-11-25 LAB — SURGICAL PATHOLOGY

## 2019-11-26 NOTE — Anesthesia Postprocedure Evaluation (Signed)
Anesthesia Post Note  Patient: Stacey Fitzgerald  Procedure(s) Performed: COLONOSCOPY WITH PROPOFOL (N/A )  Patient location during evaluation: Endoscopy Anesthesia Type: General Level of consciousness: awake and alert Pain management: pain level controlled Vital Signs Assessment: post-procedure vital signs reviewed and stable Respiratory status: spontaneous breathing, nonlabored ventilation and respiratory function stable Cardiovascular status: blood pressure returned to baseline and stable Postop Assessment: no apparent nausea or vomiting Anesthetic complications: no     Last Vitals:  Vitals:   11/23/19 1452 11/23/19 1512  BP: 122/71 (!) 144/72  Pulse:    Resp:    Temp: 36.7 C   SpO2:      Last Pain:  Vitals:   11/24/19 0728  TempSrc:   PainSc: 0-No pain                 Alphonsus Sias

## 2019-12-09 ENCOUNTER — Other Ambulatory Visit: Payer: Self-pay

## 2019-12-09 ENCOUNTER — Encounter: Payer: Self-pay | Admitting: Podiatry

## 2019-12-09 ENCOUNTER — Ambulatory Visit: Payer: Medicare Other | Admitting: Podiatry

## 2019-12-09 DIAGNOSIS — M7752 Other enthesopathy of left foot: Secondary | ICD-10-CM

## 2019-12-09 DIAGNOSIS — Q828 Other specified congenital malformations of skin: Secondary | ICD-10-CM

## 2019-12-09 NOTE — Progress Notes (Signed)
She presents today chief complaint of pain to the left foot she says this corn has not gone away and I tried everything over-the-counter to get it to go away.  She states that get some is good have to keep coming back here every year so we can try her on this.  Objective: Vital signs are stable she is alert and oriented x3.  Pulses are palpable.  She has a solitary porokeratotic lesion subfifth metatarsal of the left foot.  There is an underlying bursitis that is easily palpable.  Assessment: Bursitis fifth metatarsophalangeal joint left and porokeratosis.  Plan: At this point I injected 2 mg of dexamethasone directly into the bursa.  This will help alleviate her symptoms.  Also debrided sharply the reactive hyperkeratotic lesion.  No iatrogenic lesions were noted a Band-Aid was placed follow-up with me as needed

## 2020-01-22 ENCOUNTER — Other Ambulatory Visit: Payer: Self-pay | Admitting: Family Medicine

## 2020-01-22 DIAGNOSIS — I10 Essential (primary) hypertension: Secondary | ICD-10-CM

## 2020-03-16 ENCOUNTER — Ambulatory Visit: Payer: Self-pay | Admitting: Family Medicine

## 2020-03-16 ENCOUNTER — Ambulatory Visit: Payer: Medicare Other | Admitting: Family Medicine

## 2020-03-16 DIAGNOSIS — H6980 Other specified disorders of Eustachian tube, unspecified ear: Secondary | ICD-10-CM | POA: Diagnosis not present

## 2020-03-16 DIAGNOSIS — J019 Acute sinusitis, unspecified: Secondary | ICD-10-CM | POA: Diagnosis not present

## 2020-03-16 DIAGNOSIS — R42 Dizziness and giddiness: Secondary | ICD-10-CM | POA: Diagnosis not present

## 2020-03-17 ENCOUNTER — Ambulatory Visit: Payer: Self-pay | Admitting: Physician Assistant

## 2020-04-04 ENCOUNTER — Ambulatory Visit: Payer: Self-pay

## 2020-04-04 NOTE — Telephone Encounter (Signed)
°   BS 2  [name]. [place] Female, 79 y.o., [birth year] MRN:  [id number] Phone:  [phone] (H) PCP:  Margo Common, PA Coverage:  [place] [facility] [employer] Message from Scherrie Gerlach sent at 04/04/2020 8:50 AM EDT  She is dizzy at night. She thinks this is a UTI. Unable to reach NT. (My internet went down in middle of message)  ----- Message from Scherrie Gerlach sent at 04/04/2020 8:42 AM EDT -----  Pt went to UC 2 wks ago. She states she is dizzy every am when she gets up and it goes through the day. Comes back again at  ** The message may be incomplete. It was sent as a result of a timeout. **   Call History   Type Contact Phone  04/04/2020 08:38 AM EDT Phone (Incoming) Stacey, Rogala Fitzgerald (Self) [phone] (H)  User: Scherrie Gerlach   Pt. Reports she went to UC 2 weeks ago for dizziness. Given Azelastine spay. Still having Vertigo. Worse in the mornings at night when she is getting in and out of bed. No other symptoms. Appointment made for tomorrow. Instructed to go to ED for worsening of symptoms. Reason for Disposition  [1] MODERATE dizziness (e.g., vertigo; feels very unsteady, interferes with normal activities) AND [2] has NOT been evaluated by physician for this  Answer Assessment - Initial Assessment Questions 1. DESCRIPTION: "Describe your dizziness."     2 weeks ago 2. VERTIGO: "Do you feel like either you or the room is spinning or tilting?"      Yes 3. LIGHTHEADED: "Do you feel lightheaded?" (e.g., somewhat faint, woozy, weak upon standing)     Yes 4. SEVERITY: "How bad is it?"  "Can you walk?"   - MILD: Feels unsteady but walking normally.   - MODERATE: Feels very unsteady when walking, but not falling; interferes with normal activities (e.g., school, work) .   - SEVERE: Unable to walk without falling, or requires assistance to walk without falling.     Mild 5. ONSET:  "When did the dizziness begin?"     2 weeks ago 6. AGGRAVATING  FACTORS: "Does anything make it worse?" (e.g., standing, change in head position)     Laying down and standing up 7. CAUSE: "What do you think is causing the dizziness?"     Unsure 8. RECURRENT SYMPTOM: "Have you had dizziness before?" If Yes, ask: "When was the last time?" "What happened that time?"     Yes 9. OTHER SYMPTOMS: "Do you have any other symptoms?" (e.g., headache, weakness, numbness, vomiting, earache)     No 10. PREGNANCY: "Is there any chance you are pregnant?" "When was your last menstrual period?"      No  Protocols used: DIZZINESS - VERTIGO-A-AH

## 2020-04-04 NOTE — Progress Notes (Signed)
Established patient visit   Patient: Stacey Fitzgerald   DOB: Feb 04, 1941   79 y.o. Female  MRN: [id number] Visit Date: 04/05/2020  Today's healthcare provider: Trinna Post, PA-C   Chief Complaint  Patient presents with  . Dizziness  I,Shravan Salahuddin M Kiarrah Rausch,acting as a scribe for Trinna Post, PA-C.,have documented all relevant documentation on the behalf of Trinna Post, PA-C,as directed by  Trinna Post, PA-C while in the presence of Trinna Post, PA-C.  Subjective    Dizziness This is a new problem. The current episode started 1 to 4 weeks ago. The problem occurs 2 to 4 times per day. Pertinent negatives include no headaches, nausea, sore throat, visual change, vomiting or weakness. The symptoms are aggravated by bending and standing.  Patient reports she had dizziness ongoing for several weeks. She was seen in urgent care on 03/16/2020. She was given azelastine and a z-pack. Patient reports she had ear infection 2 weeks ago. She feels a little better from this. She reports she still has some dizziness. The room does not spin. She does not have vertigo or a history of vertigo. She has not vomited. She reports a good appetite. She is not having palpitations.       Medications: Outpatient Medications Prior to Visit  Medication Sig  . Acetaminophen (TYLENOL 8 HOUR ARTHRITIS PAIN PO) Take by mouth as needed.  Marland Kitchen levothyroxine (SYNTHROID) 50 MCG tablet Take 1 tablet (50 mcg total) by mouth daily.  . Multiple Vitamins-Minerals (CENTRUM SILVER ADULT 50+) TABS Take 1 tablet by mouth daily.  . pravastatin (PRAVACHOL) 40 MG tablet TAKE 1 TABLET (40 MG TOTAL) AT BEDTIME BY MOUTH.  . quinapril (ACCUPRIL) 20 MG tablet Take 1 tablet (20 mg total) by mouth daily.  Marland Kitchen triamcinolone lotion (KENALOG) 0.1 % APPLY TO AFFECTED AREA 3 TIMES A DAY  . [DISCONTINUED] hydrochlorothiazide (HYDRODIURIL) 12.5 MG tablet TAKE 1 TABLET BY MOUTH EVERY DAY   No facility-administered medications  prior to visit.    Review of Systems  Constitutional: Negative.   HENT: Negative for sore throat.   Respiratory: Negative.   Cardiovascular: Negative.   Gastrointestinal: Negative for nausea and vomiting.  Neurological: Positive for dizziness. Negative for weakness, light-headedness and headaches.      Objective    BP (!) 150/95 (BP Location: Left Arm, Patient Position: Standing, Cuff Size: Normal)   Pulse 95   Temp 98.2 F (36.8 C) (Oral)   Wt 161 lb 6.4 oz (73.2 kg)   SpO2 98%   BMI 25.28 kg/m    Physical Exam Constitutional:      Appearance: Normal appearance.  Cardiovascular:     Rate and Rhythm: Normal rate and regular rhythm.     Heart sounds: Normal heart sounds.  Pulmonary:     Effort: Pulmonary effort is normal.     Breath sounds: Normal breath sounds.  Skin:    General: Skin is warm and dry.  Neurological:     General: No focal deficit present.     Mental Status: She is alert and oriented to person, place, and time. Mental status is at baseline.  Psychiatric:        Mood and Affect: Mood normal.        Behavior: Behavior normal.       Results for orders placed or performed in visit on 04/05/20  CBC with Differential/Platelet  Result Value Ref Range   WBC 9.0 3.4 - 10.8 x10E3/uL   RBC  4.35 3.77 - 5.28 x10E6/uL   Hemoglobin 12.8 11.1 - 15.9 g/dL   Hematocrit 38.6 34.0 - 46.6 %   MCV 89 79 - 97 fL   MCH 29.4 26.6 - 33.0 pg   MCHC 33.2 31 - 35 g/dL   RDW 12.5 11.7 - 15.4 %   Platelets 308 150 - 450 x10E3/uL   Neutrophils 66 Not Estab. %   Lymphs 26 Not Estab. %   Monocytes 6 Not Estab. %   Eos 1 Not Estab. %   Basos 1 Not Estab. %   Neutrophils Absolute 6.0 1 - 7 x10E3/uL   Lymphocytes Absolute 2.3 0 - 3 x10E3/uL   Monocytes Absolute 0.5 0 - 0 x10E3/uL   EOS (ABSOLUTE) 0.1 0.0 - 0.4 x10E3/uL   Basophils Absolute 0.1 0 - 0 x10E3/uL   Immature Granulocytes 0 Not Estab. %   Immature Grans (Abs) 0.0 0.0 - 0.1 x10E3/uL  Comprehensive metabolic  panel  Result Value Ref Range   Glucose 90 65 - 99 mg/dL   BUN 23 8 - 27 mg/dL   Creatinine, Ser 1.27 (H) 0.57 - 1.00 mg/dL   GFR calc non Af Amer 41 (L) >59 mL/min/1.73   GFR calc Af Amer 47 (L) >59 mL/min/1.73   BUN/Creatinine Ratio 18 12 - 28   Sodium 142 134 - 144 mmol/L   Potassium 4.0 3.5 - 5.2 mmol/L   Chloride 105 96 - 106 mmol/L   CO2 24 20 - 29 mmol/L   Calcium 9.4 8.7 - 10.3 mg/dL   Total Protein 6.8 6.0 - 8.5 g/dL   Albumin 4.3 3.7 - 4.7 g/dL   Globulin, Total 2.5 1.5 - 4.5 g/dL   Albumin/Globulin Ratio 1.7 1.2 - 2.2   Bilirubin Total <0.2 0.0 - 1.2 mg/dL   Alkaline Phosphatase 71 44 - 121 IU/L   AST 8 0 - 40 IU/L   ALT 10 0 - 32 IU/L  TSH  Result Value Ref Range   TSH 2.230 0.450 - 4.500 uIU/mL    Assessment & Plan    1. Essential hypertension  Will have her hold HCTZ due to dizziness and push fluids. Follow up 1-2 weeks with PCP to see if this symptoms resolves.   2. Dizziness  - Comprehensive Metabolic Panel (CMET) - CBC with Differential - TSH    No follow-ups on file.      ITrinna Post, PA-C, have reviewed all documentation for this visit. The documentation on 04/21/20 for the exam, diagnosis, procedures, and orders are all accurate and complete.  The entirety of the information documented in the History of Present Illness, Review of Systems and Physical Exam were personally obtained by me. Portions of this information were initially documented by [facility] and reviewed by me for thoroughness and accuracy.     Paulene Floor  [facility] [phone] (phone) [phone] (fax)  [place]

## 2020-04-05 ENCOUNTER — Other Ambulatory Visit: Payer: Self-pay

## 2020-04-05 ENCOUNTER — Encounter: Payer: Self-pay | Admitting: Physician Assistant

## 2020-04-05 ENCOUNTER — Ambulatory Visit (INDEPENDENT_AMBULATORY_CARE_PROVIDER_SITE_OTHER): Payer: Medicare Other | Admitting: Physician Assistant

## 2020-04-05 ENCOUNTER — Other Ambulatory Visit: Payer: Self-pay | Admitting: Physician Assistant

## 2020-04-05 VITALS — BP 150/95 | HR 95 | Temp 98.2°F | Wt 161.4 lb

## 2020-04-05 DIAGNOSIS — I10 Essential (primary) hypertension: Secondary | ICD-10-CM

## 2020-04-05 DIAGNOSIS — R42 Dizziness and giddiness: Secondary | ICD-10-CM | POA: Diagnosis not present

## 2020-04-06 LAB — COMPREHENSIVE METABOLIC PANEL
ALT: 10 IU/L (ref 0–32)
AST: 8 IU/L (ref 0–40)
Albumin/Globulin Ratio: 1.7 (ref 1.2–2.2)
Albumin: 4.3 g/dL (ref 3.7–4.7)
Alkaline Phosphatase: 71 IU/L (ref 44–121)
BUN/Creatinine Ratio: 18 (ref 12–28)
BUN: 23 mg/dL (ref 8–27)
Bilirubin Total: <0.2 mg/dL (ref 0.0–1.2)
CO2: 24 mmol/L (ref 20–29)
Calcium: 9.4 mg/dL (ref 8.7–10.3)
Chloride: 105 mmol/L (ref 96–106)
Creatinine, Ser: 1.27 mg/dL — ABNORMAL HIGH (ref 0.57–1.00)
GFR calc Af Amer: 47 mL/min/{1.73_m2} — ABNORMAL LOW (ref >59)
GFR calc non Af Amer: 41 mL/min/{1.73_m2} — ABNORMAL LOW (ref >59)
Globulin, Total: 2.5 g/dL (ref 1.5–4.5)
Glucose: 90 mg/dL (ref 65–99)
Potassium: 4 mmol/L (ref 3.5–5.2)
Sodium: 142 mmol/L (ref 134–144)
Total Protein: 6.8 g/dL (ref 6.0–8.5)

## 2020-04-06 LAB — CBC WITH DIFFERENTIAL/PLATELET
Basophils Absolute: 0.1 10*3/uL (ref 0.0–0.2)
Basos: 1 %
EOS (ABSOLUTE): 0.1 10*3/uL (ref 0.0–0.4)
Eos: 1 %
Hematocrit: 38.6 % (ref 34.0–46.6)
Hemoglobin: 12.8 g/dL (ref 11.1–15.9)
Immature Grans (Abs): 0 10*3/uL (ref 0.0–0.1)
Immature Granulocytes: 0 %
Lymphocytes Absolute: 2.3 10*3/uL (ref 0.7–3.1)
Lymphs: 26 %
MCH: 29.4 pg (ref 26.6–33.0)
MCHC: 33.2 g/dL (ref 31.5–35.7)
MCV: 89 fL (ref 79–97)
Monocytes Absolute: 0.5 10*3/uL (ref 0.1–0.9)
Monocytes: 6 %
Neutrophils Absolute: 6 10*3/uL (ref 1.4–7.0)
Neutrophils: 66 %
Platelets: 308 10*3/uL (ref 150–450)
RBC: 4.35 x10E6/uL (ref 3.77–5.28)
RDW: 12.5 % (ref 11.7–15.4)
WBC: 9 10*3/uL (ref 3.4–10.8)

## 2020-04-06 LAB — TSH: TSH: 2.23 u[IU]/mL (ref 0.450–4.500)

## 2020-04-19 ENCOUNTER — Other Ambulatory Visit: Payer: Self-pay

## 2020-04-19 ENCOUNTER — Ambulatory Visit (INDEPENDENT_AMBULATORY_CARE_PROVIDER_SITE_OTHER): Payer: Medicare Other | Admitting: Family Medicine

## 2020-04-19 ENCOUNTER — Encounter: Payer: Self-pay | Admitting: Family Medicine

## 2020-04-19 VITALS — BP 146/68 | HR 55 | Temp 98.0°F | Wt 165.0 lb

## 2020-04-19 DIAGNOSIS — I10 Essential (primary) hypertension: Secondary | ICD-10-CM

## 2020-04-19 DIAGNOSIS — R42 Dizziness and giddiness: Secondary | ICD-10-CM

## 2020-04-19 NOTE — Progress Notes (Signed)
Acute Office Visit  Subjective:    Patient ID: Stacey Fitzgerald, female    DOB: [birth year], 79 y.o.   MRN: [id number]  Chief Complaint  Patient presents with  . Hypertension    HPI Patient is in today for follow up after having dizziness.  She was seen by Fabio Bering on 04/05/20 and was advised to discontinue her HCTZ.  She states that she no longer has any dizziness since stopping the medicine (HCTZ 12.5 mg qd the past 2 years).  Past Medical History:  Diagnosis Date  . Arthritis   . Female cystocele   . Hyperlipidemia   . Hypertension   . Hypothyroidism   . Menopausal state   . Obesity, unspecified   . Pessary maintenance   . Rectocele     Past Surgical History:  Procedure Laterality Date  . BREAST EXCISIONAL BIOPSY Left 1972   benign  . BREAST SURGERY     benign cyst  . COLONOSCOPY    . COLONOSCOPY WITH PROPOFOL N/A 11/23/2019   Procedure: COLONOSCOPY WITH PROPOFOL;  Surgeon: Toledo, Benay Pike, MD;  Location: [facility] ENDOSCOPY;  Service: Gastroenterology;  Laterality: N/A;  . miscarrage    . TUBAL LIGATION    . uterine pessary      Family History  Problem Relation Age of Onset  . Diabetes Mother   . Seizures Mother   . CVA Father   . Dementia Brother   . Diabetes Maternal Grandfather   . Mental illness Sister   . Breast cancer Neg Hx     Social History   Socioeconomic History  . Marital status: Married    Spouse name: Not on file  . Number of children: 2  . Years of education: Not on file  . Highest education level: 12th grade  Occupational History  . Occupation: retired  Tobacco Use  . Smoking status: Never Smoker  . Smokeless tobacco: Never Used  Vaping Use  . Vaping Use: Never used  Substance and Sexual Activity  . Alcohol use: Not Currently    Alcohol/week: 0.0 standard drinks  . Drug use: No  . Sexual activity: Not on file  Other Topics Concern  . Not on file  Social History Narrative  . Not on file   Social Determinants of Health    Financial Resource Strain: Low Risk   . Difficulty of Paying Living Expenses: Not hard at all  Food Insecurity: No Food Insecurity  . Worried About Charity fundraiser in the Last Year: Never true  . Ran Out of Food in the Last Year: Never true  Transportation Needs: No Transportation Needs  . Lack of Transportation (Medical): No  . Lack of Transportation (Non-Medical): No  Physical Activity: Inactive  . Days of Exercise per Week: 0 days  . Minutes of Exercise per Session: 0 min  Stress: No Stress Concern Present  . Feeling of Stress : Not at all  Social Connections: Moderately Integrated  . Frequency of Communication with Friends and Family: More than three times a week  . Frequency of Social Gatherings with Friends and Family: Twice a week  . Attends Religious Services: More than 4 times per year  . Active Member of Clubs or Organizations: No  . Attends Archivist Meetings: Never  . Marital Status: Married  Human resources officer Violence: Not At Risk  . Fear of Current or Ex-Partner: No  . Emotionally Abused: No  . Physically Abused: No  . Sexually Abused: No  Outpatient Medications Prior to Visit  Medication Sig Dispense Refill  . Acetaminophen (TYLENOL 8 HOUR ARTHRITIS PAIN PO) Take by mouth as needed.    Marland Kitchen levothyroxine (SYNTHROID) 50 MCG tablet Take 1 tablet (50 mcg total) by mouth daily. 90 tablet 3  . Multiple Vitamins-Minerals (CENTRUM SILVER ADULT 50+) TABS Take 1 tablet by mouth daily.    . pravastatin (PRAVACHOL) 40 MG tablet TAKE 1 TABLET (40 MG TOTAL) AT BEDTIME BY MOUTH. 90 tablet 3  . quinapril (ACCUPRIL) 20 MG tablet Take 1 tablet (20 mg total) by mouth daily. 90 tablet 3  . triamcinolone lotion (KENALOG) 0.1 % APPLY TO AFFECTED AREA 3 TIMES A DAY     No facility-administered medications prior to visit.    Allergies  Allergen Reactions  . Cephalexin Hives    Review of Systems  Cardiovascular: Negative for chest pain, palpitations and leg  swelling.  Neurological: Negative for dizziness, light-headedness and headaches.      Objective:    Physical Exam Constitutional:      General: She is not in acute distress.    Appearance: She is well-developed.  HENT:     Head: Normocephalic and atraumatic.     Right Ear: Hearing and tympanic membrane normal.     Left Ear: Hearing and tympanic membrane normal.     Nose: Nose normal.  Eyes:     General: Lids are normal. No scleral icterus.       Right eye: No discharge.        Left eye: No discharge.     Conjunctiva/sclera: Conjunctivae normal.  Cardiovascular:     Rate and Rhythm: Normal rate and regular rhythm.     Heart sounds: Normal heart sounds.  Pulmonary:     Effort: Pulmonary effort is normal. No respiratory distress.     Breath sounds: Normal breath sounds.  Musculoskeletal:        General: Normal range of motion.     Cervical back: Neck supple.  Skin:    Findings: No lesion or rash.  Neurological:     Mental Status: She is alert and oriented to person, place, and time.  Psychiatric:        Speech: Speech normal.        Behavior: Behavior normal.        Thought Content: Thought content normal.     BP (!) 146/68 (BP Location: Right Arm, Patient Position: Sitting, Cuff Size: Normal)   Pulse (!) 55   Temp 98 F (36.7 C) (Oral)   Wt 165 lb (74.8 kg)   SpO2 100%   BMI 25.84 kg/m  Wt Readings from Last 3 Encounters:  04/19/20 165 lb (74.8 kg)  04/05/20 161 lb 6.4 oz (73.2 kg)  11/23/19 162 lb (73.5 kg)    Health Maintenance Due  Topic Date Due  . Hepatitis C Screening  Never done  . INFLUENZA VACCINE  Never done    There are no preventive care reminders to display for this patient.   Lab Results  Component Value Date   TSH 2.230 04/05/2020   Lab Results  Component Value Date   WBC 9.0 04/05/2020   HGB 12.8 04/05/2020   HCT 38.6 04/05/2020   MCV 89 04/05/2020   PLT 308 04/05/2020   Lab Results  Component Value Date   NA 142 04/05/2020    K 4.0 04/05/2020   CO2 24 04/05/2020   GLUCOSE 90 04/05/2020   BUN 23 04/05/2020   CREATININE 1.27 (H)  04/05/2020   BILITOT <0.2 04/05/2020   ALKPHOS 71 04/05/2020   AST 8 04/05/2020   ALT 10 04/05/2020   PROT 6.8 04/05/2020   ALBUMIN 4.3 04/05/2020   CALCIUM 9.4 04/05/2020   Lab Results  Component Value Date   CHOL 191 10/22/2019   Lab Results  Component Value Date   HDL 43 10/22/2019   Lab Results  Component Value Date   LDLCALC 119 (H) 10/22/2019   Lab Results  Component Value Date   TRIG 163 (H) 10/22/2019   Lab Results  Component Value Date   CHOLHDL 4.4 10/22/2019   Lab Results  Component Value Date   HGBA1C 5.4 05/15/2016       Assessment & Plan:   1. Dizziness Finished the Z-pak and Azelastin Nasal Spray with clearing of "ear infection" and dizziness has resolved. Feeling much improved.  2. Essential hypertension BP improved with discontinuing the HCTZ. Continues to take the Accupril 20 mg qd and restricting salt with decaffeinated beverages. Recheck BP and fasting labs in 1 month.  Andres Shad, PA, have reviewed all documentation for this visit. The documentation on 04/19/20 for the exam, diagnosis, procedures, and orders are all accurate and complete.   No orders of the defined types were placed in this encounter.    Juluis Mire, CMA

## 2020-05-11 DIAGNOSIS — Z4689 Encounter for fitting and adjustment of other specified devices: Secondary | ICD-10-CM | POA: Diagnosis not present

## 2020-05-17 ENCOUNTER — Ambulatory Visit (INDEPENDENT_AMBULATORY_CARE_PROVIDER_SITE_OTHER): Payer: Medicare Other | Admitting: Family Medicine

## 2020-05-17 ENCOUNTER — Other Ambulatory Visit: Payer: Self-pay

## 2020-05-17 ENCOUNTER — Encounter: Payer: Self-pay | Admitting: Family Medicine

## 2020-05-17 VITALS — BP 128/84 | HR 57 | Temp 97.7°F | Wt 164.0 lb

## 2020-05-17 DIAGNOSIS — E7800 Pure hypercholesterolemia, unspecified: Secondary | ICD-10-CM

## 2020-05-17 DIAGNOSIS — I10 Essential (primary) hypertension: Secondary | ICD-10-CM | POA: Diagnosis not present

## 2020-05-17 NOTE — Progress Notes (Signed)
Established patient visit   Patient: Stacey Fitzgerald   DOB: [birth year]   79 y.o. Female  MRN: [id number] Visit Date: 05/17/2020  Today's healthcare provider: Vernie Murders, PA   No chief complaint on file.  Subjective    HPI  Hypertension, follow-up  BP Readings from Last 3 Encounters:  05/17/20 (!) 152/79  04/19/20 (!) 146/68  04/05/20 (!) 150/95   Wt Readings from Last 3 Encounters:  05/17/20 164 lb (74.4 kg)  04/19/20 165 lb (74.8 kg)  04/05/20 161 lb 6.4 oz (73.2 kg)     She was last seen for hypertension 1 months ago.  BP at that visit was 146/68. Management since that visit includes none.  However prior to the last visit patient had her HCTZ discontinued.  She reports good compliance with treatment. She is not having side effects.   Outside blood pressures are being checked.  The patient can not recall the numbers bur states they have been good. Symptoms: No chest pain No chest pressure  No palpitations No syncope  No dyspnea No orthopnea  No paroxysmal nocturnal dyspnea No lower extremity edema   Pertinent labs: Lab Results  Component Value Date   CHOL 191 10/22/2019   HDL 43 10/22/2019   LDLCALC 119 (H) 10/22/2019   TRIG 163 (H) 10/22/2019   CHOLHDL 4.4 10/22/2019   Lab Results  Component Value Date   NA 142 04/05/2020   K 4.0 04/05/2020   CREATININE 1.27 (H) 04/05/2020   GFRNONAA 41 (L) 04/05/2020   GFRAA 47 (L) 04/05/2020   GLUCOSE 90 04/05/2020     The 10-year ASCVD risk score Mikey Bussing DC Jr., et al., 2013) is: 36.5%   ---------------------------------------------------------------------------------------------------  Past Medical History:  Diagnosis Date   Arthritis    Female cystocele    Hyperlipidemia    Hypertension    Hypothyroidism    Menopausal state    Obesity, unspecified    Pessary maintenance    Rectocele    Past Surgical History:  Procedure Laterality Date   BREAST EXCISIONAL BIOPSY Left 1972    benign   BREAST SURGERY     benign cyst   COLONOSCOPY     COLONOSCOPY WITH PROPOFOL N/A 11/23/2019   Procedure: COLONOSCOPY WITH PROPOFOL;  Surgeon: Toledo, Benay Pike, MD;  Location: [facility]COPY;  Service: Gastroenterology;  Laterality: N/A;   miscarrage     TUBAL LIGATION     uterine pessary     Social History   Tobacco Use   Smoking status: Never Smoker   Smokeless tobacco: Never Used  Vaping Use   Vaping Use: Never used  Substance Use Topics   Alcohol use: Not Currently    Alcohol/week: 0.0 standard drinks   Drug use: No   Family History  Problem Relation Age of Onset   Diabetes Mother    Seizures Mother    CVA Father    Dementia Brother    Diabetes Maternal Grandfather    Mental illness Sister    Breast cancer Neg Hx    Allergies  Allergen Reactions   Cephalexin Hives   Medications: Outpatient Medications Prior to Visit  Medication Sig   Acetaminophen (TYLENOL 8 HOUR ARTHRITIS PAIN PO) Take by mouth as needed.   levothyroxine (SYNTHROID) 50 MCG tablet Take 1 tablet (50 mcg total) by mouth daily.   Multiple Vitamins-Minerals (CENTRUM SILVER ADULT 50+) TABS Take 1 tablet by mouth daily.   pravastatin (PRAVACHOL) 40 MG tablet TAKE 1 TABLET (40  MG TOTAL) AT BEDTIME BY MOUTH.   quinapril (ACCUPRIL) 20 MG tablet Take 1 tablet (20 mg total) by mouth daily.   triamcinolone lotion (KENALOG) 0.1 % APPLY TO AFFECTED AREA 3 TIMES A DAY   No facility-administered medications prior to visit.    Review of Systems  Constitutional: Negative.   HENT: Negative.   Eyes: Negative.   Respiratory: Negative.   Cardiovascular: Negative.   Gastrointestinal: Negative.   Musculoskeletal: Negative.        Objective    BP (!) 152/79 (BP Location: Right Arm, Patient Position: Sitting, Cuff Size: Normal)    Pulse (!) 57    Temp 97.7 F (36.5 C) (Oral)    Wt 164 lb (74.4 kg)    SpO2 99%    BMI 25.69 kg/m  Recheck of BP after 5-10 minute rest in exam room  was 128/84.    Physical Exam Constitutional:      General: She is not in acute distress.    Appearance: She is well-developed.  HENT:     Head: Normocephalic and atraumatic.     Right Ear: Hearing normal.     Left Ear: Hearing normal.     Nose: Nose normal.  Eyes:     General: Lids are normal. No scleral icterus.       Right eye: No discharge.        Left eye: No discharge.     Conjunctiva/sclera: Conjunctivae normal.  Cardiovascular:     Rate and Rhythm: Normal rate and regular rhythm.     Heart sounds: Normal heart sounds.  Pulmonary:     Effort: Pulmonary effort is normal. No respiratory distress.     Breath sounds: Normal breath sounds.  Abdominal:     General: Bowel sounds are normal.     Palpations: Abdomen is soft.  Musculoskeletal:        General: Normal range of motion.  Skin:    Findings: No lesion or rash.  Neurological:     Mental Status: She is alert and oriented to person, place, and time.  Psychiatric:        Speech: Speech normal.        Behavior: Behavior normal.        Thought Content: Thought content normal.       No results found for any visits on 05/17/20.  Assessment & Plan     1. Essential hypertension Tolerating Accupril 20 mg qd without hives or cough. No chest pains, palpitations or dyspnea. Recheck of BP today in the exam room was 128/84. States this is what the readings are at home. Will recheck labs and continue present dosage. Refuses flu and COVID booster vaccinations. - CBC with Differential/Platelet - Comprehensive metabolic panel - Lipid panel - TSH  2. Pure hypercholesterolemia Continues to try to follow low fat diet and working in the yard for exercise. Still taking Pravastatin 40 mg hs. Recheck labs and follow up pending reports. - Comprehensive metabolic panel - Lipid panel - TSH   No follow-ups on file.      Andres Shad, PA, have reviewed all documentation for this visit. The documentation on 05/17/20 for the  exam, diagnosis, procedures, and orders are all accurate and complete.    Vernie Murders, [place] [phone] (phone) [phone] (fax)  [place]

## 2020-05-31 DIAGNOSIS — E7800 Pure hypercholesterolemia, unspecified: Secondary | ICD-10-CM | POA: Diagnosis not present

## 2020-05-31 DIAGNOSIS — I10 Essential (primary) hypertension: Secondary | ICD-10-CM | POA: Diagnosis not present

## 2020-06-01 LAB — CBC WITH DIFFERENTIAL/PLATELET
Basophils Absolute: 0.1 10*3/uL (ref 0.0–0.2)
Basos: 1 %
EOS (ABSOLUTE): 0.2 10*3/uL (ref 0.0–0.4)
Eos: 2 %
Hematocrit: 39.5 % (ref 34.0–46.6)
Hemoglobin: 12.9 g/dL (ref 11.1–15.9)
Immature Grans (Abs): 0 10*3/uL (ref 0.0–0.1)
Immature Granulocytes: 0 %
Lymphocytes Absolute: 2.3 10*3/uL (ref 0.7–3.1)
Lymphs: 27 %
MCH: 28.4 pg (ref 26.6–33.0)
MCHC: 32.7 g/dL (ref 31.5–35.7)
MCV: 87 fL (ref 79–97)
Monocytes Absolute: 0.6 10*3/uL (ref 0.1–0.9)
Monocytes: 7 %
Neutrophils Absolute: 5.6 10*3/uL (ref 1.4–7.0)
Neutrophils: 63 %
Platelets: 300 10*3/uL (ref 150–450)
RBC: 4.54 x10E6/uL (ref 3.77–5.28)
RDW: 12.2 % (ref 11.7–15.4)
WBC: 8.8 10*3/uL (ref 3.4–10.8)

## 2020-06-01 LAB — TSH: TSH: 2.86 u[IU]/mL (ref 0.450–4.500)

## 2020-06-01 LAB — COMPREHENSIVE METABOLIC PANEL
ALT: 10 IU/L (ref 0–32)
AST: 8 IU/L (ref 0–40)
Albumin/Globulin Ratio: 1.6 (ref 1.2–2.2)
Albumin: 4.4 g/dL (ref 3.7–4.7)
Alkaline Phosphatase: 69 IU/L (ref 44–121)
BUN/Creatinine Ratio: 18 (ref 12–28)
BUN: 20 mg/dL (ref 8–27)
Bilirubin Total: 0.4 mg/dL (ref 0.0–1.2)
CO2: 24 mmol/L (ref 20–29)
Calcium: 9.7 mg/dL (ref 8.7–10.3)
Chloride: 104 mmol/L (ref 96–106)
Creatinine, Ser: 1.14 mg/dL — ABNORMAL HIGH (ref 0.57–1.00)
GFR calc Af Amer: 53 mL/min/{1.73_m2} — ABNORMAL LOW (ref >59)
GFR calc non Af Amer: 46 mL/min/{1.73_m2} — ABNORMAL LOW (ref >59)
Globulin, Total: 2.7 g/dL (ref 1.5–4.5)
Glucose: 86 mg/dL (ref 65–99)
Potassium: 4.3 mmol/L (ref 3.5–5.2)
Sodium: 141 mmol/L (ref 134–144)
Total Protein: 7.1 g/dL (ref 6.0–8.5)

## 2020-06-01 LAB — LIPID PANEL
Chol/HDL Ratio: 4.4 ratio (ref 0.0–4.4)
Cholesterol, Total: 211 mg/dL — ABNORMAL HIGH (ref 100–199)
HDL: 48 mg/dL (ref >39)
LDL Chol Calc (NIH): 133 mg/dL — ABNORMAL HIGH (ref 0–99)
Triglycerides: 168 mg/dL — ABNORMAL HIGH (ref 0–149)
VLDL Cholesterol Cal: 30 mg/dL (ref 5–40)

## 2020-06-02 ENCOUNTER — Telehealth: Payer: Self-pay

## 2020-06-02 NOTE — Telephone Encounter (Signed)
Tried calling patient. Left message to call back. OK for PEC triage to advise of results and schedule follow up appointment. Please route message back to the office with patients response.  

## 2020-06-02 NOTE — Telephone Encounter (Signed)
-----   Message from [place], [place] sent at 06/01/2020  9:44 PM EST ----- Kidney function tests improved over test done 7 months ago. Triglycerides are still elevated and LDL higher. Need to change Pravastatin to Rosuvastatin 10 mg qd #90 & 3 RF and recheck levels in 3 months.

## 2020-06-02 NOTE — Telephone Encounter (Signed)
See result note.  

## 2020-06-03 ENCOUNTER — Other Ambulatory Visit: Payer: Self-pay | Admitting: Family Medicine

## 2020-06-03 MED ORDER — ROSUVASTATIN CALCIUM 10 MG PO TABS: 10.0000 mg | ORAL_TABLET | Freq: Every day | ORAL | 3 refills | Status: DC

## 2020-07-05 ENCOUNTER — Telehealth (INDEPENDENT_AMBULATORY_CARE_PROVIDER_SITE_OTHER): Payer: Medicare Other | Admitting: Family Medicine

## 2020-07-05 ENCOUNTER — Encounter: Payer: Self-pay | Admitting: Family Medicine

## 2020-07-05 DIAGNOSIS — J0100 Acute maxillary sinusitis, unspecified: Secondary | ICD-10-CM

## 2020-07-05 MED ORDER — AZITHROMYCIN 250 MG PO TABS: ORAL_TABLET | ORAL | 0 refills | Status: DC

## 2020-07-05 NOTE — Progress Notes (Signed)
Virtual telephone visit    Virtual Visit via Telephone Note   This visit type was conducted due to national recommendations for restrictions regarding the COVID-19 Pandemic (e.g. social distancing) in an effort to limit this patient's exposure and mitigate transmission in our community. Due to her co-morbid illnesses, this patient is at least at moderate risk for complications without adequate follow up. This format is felt to be most appropriate for this patient at this time. The patient did not have access to video technology or had technical difficulties with video requiring transitioning to audio format only (telephone). Physical exam was limited to content and character of the telephone converstion.    Patient location: Parking lot of office Provider location: Office  I discussed the limitations of evaluation and management by telemedicine and the availability of in person appointments. The patient expressed understanding and agreed to proceed.   Visit Date: 07/05/2020  Today's healthcare provider: Vernie Murders, PA-C   No chief complaint on file.  Subjective    HPI  The patient is a 79 year old female who presents via phone visit for symptoms of congestion.  She states that about 10 days ago she began having some head congestion that has been clear.  It is not causing her to cough, have any runny nose, or other symptoms.  She states she is just so stopped up and it has been going on for too long.  She has been using some type of OTC sinus medication and states that it has helped some.   Past Medical History:  Diagnosis Date  . Arthritis   . Female cystocele   . Hyperlipidemia   . Hypertension   . Hypothyroidism   . Menopausal state   . Obesity, unspecified   . Pessary maintenance   . Rectocele    Past Surgical History:  Procedure Laterality Date  . BREAST EXCISIONAL BIOPSY Left 1972   benign  . BREAST SURGERY     benign cyst  . COLONOSCOPY    . COLONOSCOPY  WITH PROPOFOL N/A 11/23/2019   Procedure: COLONOSCOPY WITH PROPOFOL;  Surgeon: Toledo, Benay Pike, MD;  Location: [facility] END[facility];  Service: Gastroenterology;  Laterality: N/A;  . miscarrage    . TUBAL LIGATION    . uterine pessary     Social History   Tobacco Use  . Smoking status: Never Smoker  . Smokeless tobacco: Never Used  Vaping Use  . Vaping Use: Never used  Substance Use Topics  . Alcohol use: Not Currently    Alcohol/week: 0.0 standard drinks  . Drug use: No   Family History  Problem Relation Age of Onset  . Diabetes Mother   . Seizures Mother   . CVA Father   . Dementia Brother   . Diabetes Maternal Grandfather   . Mental illness Sister   . Breast cancer Neg Hx    Allergies  Allergen Reactions  . Cephalexin Hives      Medications: Outpatient Medications Prior to Visit  Medication Sig  . Acetaminophen (TYLENOL 8 HOUR ARTHRITIS PAIN PO) Take by mouth as needed.  Marland Kitchen levothyroxine (SYNTHROID) 50 MCG tablet Take 1 tablet (50 mcg total) by mouth daily.  . Multiple Vitamins-Minerals (CENTRUM SILVER ADULT 50+) TABS Take 1 tablet by mouth daily.  . quinapril (ACCUPRIL) 20 MG tablet Take 1 tablet (20 mg total) by mouth daily.  . rosuvastatin (CRESTOR) 10 MG tablet Take 1 tablet (10 mg total) by mouth daily.  . [DISCONTINUED] triamcinolone lotion (KENALOG)  0.1 % APPLY TO AFFECTED AREA 3 TIMES A DAY   No facility-administered medications prior to visit.    Review of Systems  Constitutional: Negative for fever.  HENT: Positive for congestion. Negative for ear discharge, ear pain, facial swelling, postnasal drip, rhinorrhea, sinus pressure, sinus pain, sneezing, sore throat and tinnitus.   Respiratory: Negative for cough, shortness of breath and wheezing.   Cardiovascular: Negative for chest pain.  Neurological: Negative for dizziness and headaches.      Objective    There were no vitals taken for this visit.  During telephonic interview, no cough or acute  respiratory distress. Sounds a little stuffy.   Assessment & Plan     1. Subacute maxillary sinusitis Developed head congestion with some clear rhinorrhea over the past 10 day that persists. No loss of taste, fever or body aches. No GI upset. May use Zyrtec and Flonase with a Z-pak. Monitor for COVID symptoms and get tested if concerned. Increase fluid intake and recheck prn. - azithromycin (ZITHROMAX) 250 MG tablet; Take 2 tablets by mouth today, then, 1 daily for 4 days.  Dispense: 6 tablet; Refill: 0   No follow-ups on file.    I discussed the assessment and treatment plan with the patient. The patient was provided an opportunity to ask questions and all were answered. The patient agreed with the plan and demonstrated an understanding of the instructions.   The patient was advised to call back or seek an in-person evaluation if the symptoms worsen or if the condition fails to improve as anticipated.  I provided 20 minutes of non-face-to-face time during this encounter.  I, Dennis Chrismon, PA-C, have reviewed all documentation for this visit. The documentation on 07/05/20 for the exam, diagnosis, procedures, and orders are all accurate and complete.   Vernie Murders, PA-C [employer] [phone] (phone) [phone] (fax)  [place]

## 2020-07-07 ENCOUNTER — Ambulatory Visit: Payer: Medicare Other | Admitting: Podiatry

## 2020-07-13 ENCOUNTER — Ambulatory Visit: Payer: Medicare Other | Admitting: Podiatry

## 2020-07-13 ENCOUNTER — Encounter: Payer: Self-pay | Admitting: Podiatry

## 2020-07-13 ENCOUNTER — Other Ambulatory Visit: Payer: Self-pay

## 2020-07-13 DIAGNOSIS — Q828 Other specified congenital malformations of skin: Secondary | ICD-10-CM

## 2020-07-13 DIAGNOSIS — M7752 Other enthesopathy of left foot: Secondary | ICD-10-CM

## 2020-07-13 MED ORDER — DEXAMETHASONE SODIUM PHOSPHATE 120 MG/30ML IJ SOLN
2.0000 mg | Freq: Once | INTRAMUSCULAR | Status: AC
  Administered 2020-07-13: 2 mg via INTRA_ARTICULAR

## 2020-07-13 NOTE — Progress Notes (Signed)
She presents today wants me to trim the callus to the plantar aspect of her left forefoot.  She states is been bother me for quite some time.  Objective: Vital signs are stable alert oriented x3.  Pulses are palpable.  There is no erythema edema cellulitis drainage or odor she does have some fluctuance beneath the fifth metatarsal head of the left foot with an overlying reactive hyperkeratotic lesion.  Assessment: Benign soft tissue lesion with bursitis beneath the fifth metatarsal head.  Plan: I injected the bursa today 2 mg dexamethasone local anesthetic debrided the reactive hyperkeratotic tissue follow-up with her as needed.

## 2020-09-09 ENCOUNTER — Telehealth: Payer: Self-pay | Admitting: Family Medicine

## 2020-09-09 NOTE — Telephone Encounter (Signed)
Copied from [place] [id number]. Topic: Medicare AWV >> Sep 09, 2020  2:08 PM Cher Nakai R wrote: Reason for CRM:  No answer unable to leave a message for patient to call back and schedule Medicare Annual Wellness Visit (AWV) in office.   If not able to come in office, please offer to do virtually or by telephone.   Last AWV 09/16/2019  Please schedule at anytime with [facility] Advisor.  If any questions, please contact me at [phone]

## 2020-09-16 ENCOUNTER — Other Ambulatory Visit: Payer: Self-pay | Admitting: Obstetrics & Gynecology

## 2020-09-16 DIAGNOSIS — Z1231 Encounter for screening mammogram for malignant neoplasm of breast: Secondary | ICD-10-CM

## 2020-10-11 DIAGNOSIS — N390 Urinary tract infection, site not specified: Secondary | ICD-10-CM | POA: Diagnosis not present

## 2020-10-11 DIAGNOSIS — R300 Dysuria: Secondary | ICD-10-CM | POA: Diagnosis not present

## 2020-10-25 DIAGNOSIS — Z4689 Encounter for fitting and adjustment of other specified devices: Secondary | ICD-10-CM | POA: Diagnosis not present

## 2020-11-30 ENCOUNTER — Encounter: Payer: Self-pay | Admitting: Podiatry

## 2020-11-30 ENCOUNTER — Ambulatory Visit: Payer: Medicare Other | Admitting: Podiatry

## 2020-11-30 ENCOUNTER — Other Ambulatory Visit: Payer: Self-pay

## 2020-11-30 DIAGNOSIS — M7752 Other enthesopathy of left foot: Secondary | ICD-10-CM

## 2020-11-30 DIAGNOSIS — D2372 Other benign neoplasm of skin of left lower limb, including hip: Secondary | ICD-10-CM | POA: Diagnosis not present

## 2020-11-30 MED ORDER — DEXAMETHASONE SODIUM PHOSPHATE 120 MG/30ML IJ SOLN
2.0000 mg | Freq: Once | INTRAMUSCULAR | Status: AC
  Administered 2020-11-30: 2 mg via INTRA_ARTICULAR

## 2020-12-04 NOTE — Progress Notes (Signed)
She presents today chief complaint of a painful lesion beneath the fifth metatarsal head of the left foot.  States it is very sore she has not been in since December 2021 she states that it was doing fine until just the other week.  Objective: Vital signs stable alert oriented x3 there is no erythema edema cellulitis drainage or odor.  She does have a boggy sensation on palpation subfifth met head left.  This consistent with a bursitis.  There is also an overlying punctated hyperkeratotic lesion this is a benign skin lesion.  Assessment: Benign skin lesion with a bursitis subfifth left.  Plan: Discussed etiology pathology conservative surgical therapies at this point in time I injected the bursa today with dexamethasone and local anesthetic and debrided the porokeratotic lesion or the benign skin lesion for her.  Placed padding we will follow-up with her on an as-needed basis.

## 2020-12-23 ENCOUNTER — Encounter: Payer: Self-pay | Admitting: Family Medicine

## 2020-12-23 ENCOUNTER — Other Ambulatory Visit: Payer: Self-pay

## 2020-12-23 ENCOUNTER — Ambulatory Visit (INDEPENDENT_AMBULATORY_CARE_PROVIDER_SITE_OTHER): Payer: Medicare Other | Admitting: Family Medicine

## 2020-12-23 VITALS — BP 149/76 | HR 67 | Wt 167.0 lb

## 2020-12-23 DIAGNOSIS — I10 Essential (primary) hypertension: Secondary | ICD-10-CM

## 2020-12-23 DIAGNOSIS — E039 Hypothyroidism, unspecified: Secondary | ICD-10-CM

## 2020-12-23 DIAGNOSIS — E7800 Pure hypercholesterolemia, unspecified: Secondary | ICD-10-CM | POA: Diagnosis not present

## 2020-12-23 NOTE — Progress Notes (Signed)
Established patient visit   Patient: Stacey Fitzgerald   DOB: 1941-05-28   80 y.o. Female  MRN: [id number] Visit Date: 12/23/2020  Today's healthcare provider: Vernie Murders, PA-C   Chief Complaint  Patient presents with  . Hypertension  . Hypothyroidism  . Hyperlipidemia   Subjective    HPI  Hypertension, follow-up  BP Readings from Last 3 Encounters:  12/23/20 (!) 149/76  05/17/20 128/84  04/19/20 (!) 146/68   Wt Readings from Last 3 Encounters:  12/23/20 167 lb (75.8 kg)  05/17/20 164 lb (74.4 kg)  04/19/20 165 lb (74.8 kg)     She was last seen for hypertension 6 months ago.  BP at that visit was 159/79. Management since that visit includes no changes.  She reports excellent compliance with treatment. She is not having side effects. She is following a Regular diet. She is exercising. She does not smoke.  Use of agents associated with hypertension: none.   Outside blood pressures are 120's/60's.   --------------------------------------------------------------------------------------------------- Lipid/Cholesterol, Follow-up  Last lipid panel Other pertinent labs  Lab Results  Component Value Date   CHOL 211 (H) 05/31/2020   HDL 48 05/31/2020   LDLCALC 133 (H) 05/31/2020   TRIG 168 (H) 05/31/2020   CHOLHDL 4.4 05/31/2020   Lab Results  Component Value Date   ALT 10 05/31/2020   AST 8 05/31/2020   PLT 300 05/31/2020   TSH 2.860 05/31/2020     She was last seen for this 6 months ago.  Management since that visit includes change pravastatin to Rosuvastatin 10 mg daily.  She reports excellent compliance with treatment. She is not having side effects.   Symptoms: No chest pain No chest pressure/discomfort  No dyspnea No lower extremity edema  No numbness or tingling of extremity No orthopnea  No palpitations No paroxysmal nocturnal dyspnea  No speech difficulty No syncope   Current diet: in general, a "healthy" diet     The 10-year  ASCVD risk score Mikey Bussing DC Jr., et al., 2013) is: 38.9%  --------------------------------------------------------------------------------------------------- Hypothyroid, follow-up  Lab Results  Component Value Date   TSH 2.860 05/31/2020   TSH 2.230 04/05/2020   TSH 3.340 10/22/2019   T4TOTAL 9.2 09/11/2018   T4TOTAL 7.6 12/13/2017   Wt Readings from Last 3 Encounters:  12/23/20 167 lb (75.8 kg)  05/17/20 164 lb (74.4 kg)  04/19/20 165 lb (74.8 kg)    She was last seen for hypothyroid 6 months ago.  Management since that visit includes no changes. She reports excellent compliance with treatment. She is not having side effects.  Symptoms: No change in energy level No constipation  No diarrhea No heat / cold intolerance  No nervousness No palpitations  No weight changes    -----------------------------------------------------------------------------------------   Patient Active Problem List   Diagnosis Date Noted  . Abortion, spontaneous 02/02/2015  . Bacterial skin infection of leg 02/02/2015  . HLD (hyperlipidemia) 02/02/2015  . Pure hypercholesterolemia 01/28/2015  . Essential hypertension 01/28/2015  . Cellulitis 01/28/2015  . Hypothyroidism 01/28/2015  . Monilial intertrigo 01/28/2015  . Rhus dermatitis 01/28/2015  . Low back pain, episodic 01/28/2015  . Hernia, rectovaginal 10/20/2013  . Adiposity 10/20/2013  . Climacteric 10/20/2013  . BP (high blood pressure) 10/20/2013  . Hypercholesteremia 10/20/2013  . Cystocele with prolapse 10/20/2013  . Arthritis 10/20/2013   Past Surgical History:  Procedure Laterality Date  . BREAST EXCISIONAL BIOPSY Left 1972   benign  . BREAST SURGERY  benign cyst  . COLONOSCOPY    . COLONOSCOPY WITH PROPOFOL N/A 11/23/2019   Procedure: COLONOSCOPY WITH PROPOFOL;  Surgeon: Toledo, Benay Pike, MD;  Location: [facility] END[facility]COPY;  Service: Gastroenterology;  Laterality: N/A;  . miscarrage    . TUBAL LIGATION    . uterine pessary      Family History  Problem Relation Age of Onset  . Diabetes Mother   . Seizures Mother   . CVA Father   . Dementia Brother   . Diabetes Maternal Grandfather   . Mental illness Sister   . Breast cancer Neg Hx     Social History   Tobacco Use  . Smoking status: Never Smoker  . Smokeless tobacco: Never Used  Vaping Use  . Vaping Use: Never used  Substance Use Topics  . Alcohol use: Not Currently    Alcohol/week: 0.0 standard drinks  . Drug use: No   Allergies  Allergen Reactions  . Cephalexin Hives       Medications: Outpatient Medications Prior to Visit  Medication Sig  . Acetaminophen (TYLENOL 8 HOUR ARTHRITIS PAIN PO) Take by mouth as needed.  Marland Kitchen levothyroxine (SYNTHROID) 50 MCG tablet Take 1 tablet (50 mcg total) by mouth daily.  . Multiple Vitamins-Minerals (CENTRUM SILVER ADULT 50+) TABS Take 1 tablet by mouth daily.  . quinapril (ACCUPRIL) 20 MG tablet Take 1 tablet (20 mg total) by mouth daily.  . rosuvastatin (CRESTOR) 10 MG tablet Take 1 tablet (10 mg total) by mouth daily.  Marland Kitchen azithromycin (ZITHROMAX) 250 MG tablet Take 2 tablets by mouth today, then, 1 daily for 4 days.   No facility-administered medications prior to visit.    Review of Systems  Constitutional: Negative.   Respiratory: Negative.   Cardiovascular: Negative.   Gastrointestinal: Negative.   Musculoskeletal: Positive for back pain (Chronic issue).  Neurological: Negative for dizziness, light-headedness and headaches.       Objective    BP (!) 149/76 (BP Location: Right Arm, Patient Position: Sitting, Cuff Size: Normal)   Pulse 67   Wt 167 lb (75.8 kg)   SpO2 99%   BMI 26.16 kg/m  BP Readings from Last 3 Encounters:  12/23/20 (!) 149/76  05/17/20 128/84  04/19/20 (!) 146/68   Wt Readings from Last 3 Encounters:  12/23/20 167 lb (75.8 kg)  05/17/20 164 lb (74.4 kg)  04/19/20 165 lb (74.8 kg)       Physical Exam Constitutional:      General: She is not in acute distress.     Appearance: She is well-developed.  HENT:     Head: Normocephalic and atraumatic.     Right Ear: Hearing normal.     Left Ear: Hearing normal.     Nose: Nose normal.  Eyes:     General: Lids are normal. No scleral icterus.       Right eye: No discharge.        Left eye: No discharge.     Conjunctiva/sclera: Conjunctivae normal.  Cardiovascular:     Rate and Rhythm: Normal rate and regular rhythm.     Heart sounds: Normal heart sounds.  Pulmonary:     Effort: Pulmonary effort is normal. No respiratory distress.     Breath sounds: Normal breath sounds.  Abdominal:     General: Bowel sounds are normal.     Palpations: Abdomen is soft.  Musculoskeletal:        General: Normal range of motion.     Cervical back: Neck supple.  Comments: Dorsal kyphosis.  Skin:    Findings: No lesion or rash.  Neurological:     Mental Status: She is alert and oriented to person, place, and time.  Psychiatric:        Speech: Speech normal.        Behavior: Behavior normal.        Thought Content: Thought content normal.      No results found for any visits on 12/23/20.  Assessment & Plan     1. Essential hypertension Good control with Quinapril 20 mg qd. Recheck labs. No chest pains, palpitations or edema. - CBC with Differential/Platelet - Comprehensive metabolic panel - TSH - Lipid panel  2. Pure hypercholesterolemia Tolerating Crestor 10 mg qd without side effects. Recheck labs. - Comprehensive metabolic panel - TSH - Lipid panel  3. Hypothyroidism, unspecified type Follow up labs. Continue Levothyroxine 50 mcg qd. - CBC with Differential/Platelet - Comprehensive metabolic panel - T4 - TSH   No follow-ups on file.      I, Clavin Ruhlman, PA-C, have reviewed all documentation for this visit. The documentation on 12/23/20 for the exam, diagnosis, procedures, and orders are all accurate and complete.    Vernie Murders, PA-C  [employer] [phone]  (phone) [phone] (fax)  [place]

## 2020-12-24 LAB — LIPID PANEL
Chol/HDL Ratio: 5 ratio — ABNORMAL HIGH (ref 0.0–4.4)
Cholesterol, Total: 220 mg/dL — ABNORMAL HIGH (ref 100–199)
HDL: 44 mg/dL (ref >39)
LDL Chol Calc (NIH): 142 mg/dL — ABNORMAL HIGH (ref 0–99)
Triglycerides: 190 mg/dL — ABNORMAL HIGH (ref 0–149)
VLDL Cholesterol Cal: 34 mg/dL (ref 5–40)

## 2020-12-24 LAB — COMPREHENSIVE METABOLIC PANEL
ALT: 13 IU/L (ref 0–32)
AST: 8 IU/L (ref 0–40)
Albumin/Globulin Ratio: 1.4 (ref 1.2–2.2)
Albumin: 4.2 g/dL (ref 3.7–4.7)
Alkaline Phosphatase: 72 IU/L (ref 44–121)
BUN/Creatinine Ratio: 14 (ref 12–28)
BUN: 16 mg/dL (ref 8–27)
Bilirubin Total: 0.4 mg/dL (ref 0.0–1.2)
CO2: 23 mmol/L (ref 20–29)
Calcium: 10 mg/dL (ref 8.7–10.3)
Chloride: 105 mmol/L (ref 96–106)
Creatinine, Ser: 1.15 mg/dL — ABNORMAL HIGH (ref 0.57–1.00)
Globulin, Total: 2.9 g/dL (ref 1.5–4.5)
Glucose: 91 mg/dL (ref 65–99)
Potassium: 4.7 mmol/L (ref 3.5–5.2)
Sodium: 143 mmol/L (ref 134–144)
Total Protein: 7.1 g/dL (ref 6.0–8.5)
eGFR: 48 mL/min/{1.73_m2} — ABNORMAL LOW (ref >59)

## 2020-12-24 LAB — CBC WITH DIFFERENTIAL/PLATELET
Basophils Absolute: 0.1 10*3/uL (ref 0.0–0.2)
Basos: 1 %
EOS (ABSOLUTE): 0.2 10*3/uL (ref 0.0–0.4)
Eos: 2 %
Hematocrit: 39.4 % (ref 34.0–46.6)
Hemoglobin: 13.2 g/dL (ref 11.1–15.9)
Immature Grans (Abs): 0 10*3/uL (ref 0.0–0.1)
Immature Granulocytes: 0 %
Lymphocytes Absolute: 2.3 10*3/uL (ref 0.7–3.1)
Lymphs: 25 %
MCH: 29.1 pg (ref 26.6–33.0)
MCHC: 33.5 g/dL (ref 31.5–35.7)
MCV: 87 fL (ref 79–97)
Monocytes Absolute: 0.6 10*3/uL (ref 0.1–0.9)
Monocytes: 7 %
Neutrophils Absolute: 6 10*3/uL (ref 1.4–7.0)
Neutrophils: 65 %
Platelets: 331 10*3/uL (ref 150–450)
RBC: 4.54 x10E6/uL (ref 3.77–5.28)
RDW: 12.7 % (ref 11.7–15.4)
WBC: 9.2 10*3/uL (ref 3.4–10.8)

## 2020-12-24 LAB — T4: T4, Total: 8.7 ug/dL (ref 4.5–12.0)

## 2020-12-24 LAB — TSH: TSH: 2.57 u[IU]/mL (ref 0.450–4.500)

## 2020-12-28 NOTE — Progress Notes (Signed)
No wonder the cholesterol and triglycerides have gone up. Could try Simvastatin 20 mg qd #90 instead. Cancel Rosuvastatin prescription.

## 2020-12-29 ENCOUNTER — Telehealth: Payer: Self-pay

## 2020-12-29 MED ORDER — SIMVASTATIN 20 MG PO TABS
20.0000 mg | ORAL_TABLET | Freq: Every day | ORAL | 3 refills | Status: DC
Start: 2020-12-29 — End: 2021-11-28

## 2020-12-29 NOTE — Telephone Encounter (Signed)
-----   Message from Margo Common, PA-C sent at 12/28/2020 10:02 PM EDT ----- No wonder the cholesterol and triglycerides have gone up. Could try Simvastatin 20 mg qd #90 instead. Cancel Rosuvastatin prescription.

## 2021-02-20 ENCOUNTER — Ambulatory Visit: Payer: Medicare Other | Admitting: Podiatry

## 2021-03-07 ENCOUNTER — Telehealth: Payer: Self-pay

## 2021-03-07 DIAGNOSIS — U071 COVID-19: Secondary | ICD-10-CM

## 2021-03-07 MED ORDER — NIRMATRELVIR/RITONAVIR (PAXLOVID) TABLET (RENAL DOSING): 2.0000 | ORAL_TABLET | Freq: Two times a day (BID) | ORAL | 0 refills | Status: AC

## 2021-03-07 NOTE — Telephone Encounter (Signed)
Pt advised.   Thanks,   -Luticia Tadros  

## 2021-03-07 NOTE — Telephone Encounter (Signed)
Have sent prescription Paxlovid to [facility]. She needs to start taking ASAP, today if possible.  Need to stop taking simvastatin for 10 days.

## 2021-03-07 NOTE — Telephone Encounter (Signed)
Symptoms started 03/05/2021.  Tested positive 03/06/2021   Symptoms:   Fatigued (Improved)    Denies:   Fevers  Cough Shortness of Breath  Wheezing Body aches    Pt reports having two Covid Vaccines but not any boosters.  Pharmacy: [facility] on [address]    Thanks,   -Mickel Baas

## 2021-03-07 NOTE — Telephone Encounter (Signed)
Copied from [place] [phone] [phone]. Topic: General - Other >> Mar 07, 2021  1:38 PM Leward Quan A wrote: Reason for CRM: Patient daughter Linus Orn called in to inform Vernie Murders that patient tested positive for Covid this morning along with her husband  had been having symptoms for about a week. Would like to know if there is a medication she can get Ph# [phone]

## 2021-03-11 ENCOUNTER — Other Ambulatory Visit: Payer: Self-pay | Admitting: Family Medicine

## 2021-03-11 DIAGNOSIS — I10 Essential (primary) hypertension: Secondary | ICD-10-CM

## 2021-03-11 NOTE — Telephone Encounter (Signed)
Requested Prescriptions  Pending Prescriptions Disp Refills  . quinapril (ACCUPRIL) 20 MG tablet [Pharmacy Med Name: QUINAPRIL 20 MG TABLET] 90 tablet 2    Sig: TAKE 1 TABLET BY MOUTH EVERY DAY     Cardiovascular:  ACE Inhibitors Failed - 03/11/2021  9:26 AM      Failed - Cr in normal range and within 180 days    Creat  Date Value Ref Range Status  05/31/2017 0.87 0.60 - 0.93 mg/dL Final    Comment:    For patients >79 years of age, the reference limit for Creatinine is approximately 13% higher for people identified as African-American. .    Creatinine, Ser  Date Value Ref Range Status  12/23/2020 1.15 (H) 0.57 - 1.00 mg/dL Final         Failed - Last BP in normal range    BP Readings from Last 1 Encounters:  12/23/20 (!) 149/76         Passed - K in normal range and within 180 days    Potassium  Date Value Ref Range Status  12/23/2020 4.7 3.5 - 5.2 mmol/L Final         Passed - Patient is not pregnant      Passed - Valid encounter within last 6 months    Recent Outpatient Visits          2 months ago Essential hypertension   [facility], Vickki Muff, PA-C   8 months ago Subacute maxillary sinusitis   [facility], Vickki Muff, PA-C   9 months ago Essential hypertension   [employer], Vickki Muff, PA-C   10 months ago [place], PA-C   11 months ago Essential hypertension   [place], [place], [place]

## 2021-04-24 ENCOUNTER — Ambulatory Visit: Payer: Self-pay

## 2021-04-24 NOTE — Telephone Encounter (Signed)
Called pt back and husband stated pt is not with him. Advised pt's spouse no openings at [facility]. Advised to take wife to UC. Pt's husband stated she will not go. Advised husband to RICE method to the left knee.     Answer Assessment - Initial Assessment Questions 1. LOCATION and RADIATION: "Where is the pain located?"      Left knee 2. QUALITY: "What does the pain feel like?"  (e.g., sharp, dull, aching, burning)     *No Answer* 3. SEVERITY: "How bad is the pain?" "What does it keep you from doing?"   (Scale 1-10; or mild, moderate, severe)   -  MILD (1-3): doesn't interfere with normal activities    -  MODERATE (4-7): interferes with normal activities (e.g., work or school) or awakens from sleep, limping    -  SEVERE (8-10): excruciating pain, unable to do any normal activities, unable to walk     mild 4. ONSET: "When did the pain start?" "Does it come and go, or is it there all the time?"     After a fall last week 5. RECURRENT: "Have you had this pain before?" If Yes, ask: "When, and what happened then?"     *No Answer* 6. SETTING: "Has there been any recent work, exercise or other activity that involved that part of the body?"      *No Answer* 7. AGGRAVATING FACTORS: "What makes the knee pain worse?" (e.g., walking, climbing stairs, running)     *No Answer* 8. ASSOCIATED SYMPTOMS: "Is there any swelling or redness of the knee?"     Mild swelling that has improved per husband 9. OTHER SYMPTOMS: "Do you have any other symptoms?" (e.g., chest pain, difficulty breathing, fever, calf pain)     *No Answer* 10. PREGNANCY: "Is there any chance you are pregnant?" "When was your last menstrual period?"       N/a  Protocols used: Knee Pain-A-AH

## 2021-04-25 NOTE — Telephone Encounter (Signed)
Patient called and advised as per below to go to the UC or schedule OV. She says the knee is better, but still sore and asks if there are any appointments this week in the office. I advised no openings. She says if her knee is not any better she will call back to schedule for next week. I advised UC if not better, she verbalized understanding.

## 2021-04-25 NOTE — Telephone Encounter (Signed)
Lmtcb, no availble appts in office, recommend urgent care for evaluation. If patient would like I can reach out to [facility] and see if there is availability. Okay for [facility] to advise. KW

## 2021-04-27 NOTE — Telephone Encounter (Signed)
FYI. KW 

## 2021-05-01 ENCOUNTER — Ambulatory Visit: Payer: Self-pay

## 2021-05-01 NOTE — Telephone Encounter (Signed)
Patient fell and hurt her knee. Patient wants an xray and not an office visit.   Pt has decided to go to UC as they can see her sooner and in the evening which is more convenient.

## 2021-05-09 DIAGNOSIS — Z4689 Encounter for fitting and adjustment of other specified devices: Secondary | ICD-10-CM | POA: Diagnosis not present

## 2021-05-15 ENCOUNTER — Telehealth: Payer: Self-pay

## 2021-05-15 NOTE — Telephone Encounter (Signed)
Copied from [place] [phone]. Topic: Appointment Scheduling - Scheduling Inquiry for Clinic >> May 15, 2021  3:57 PM Celene Kras wrote: Reason for CRM: Pt called and is requesting to have a nurse give her a call back to discuss when she should get her next lab work appt scheduled. Please advise.

## 2021-05-16 DIAGNOSIS — M25562 Pain in left knee: Secondary | ICD-10-CM | POA: Diagnosis not present

## 2021-05-17 NOTE — Telephone Encounter (Signed)
Lmtcb patient does not need an appt to get labs drawn. Labs can be drawn at our office M-F 8-11:30AM and 1-4:30PM patient will need to make follow up for chronic problems in 6 months. KW

## 2021-06-06 DIAGNOSIS — M25562 Pain in left knee: Secondary | ICD-10-CM | POA: Diagnosis not present

## 2021-06-06 DIAGNOSIS — W010XXA Fall on same level from slipping, tripping and stumbling without subsequent striking against object, initial encounter: Secondary | ICD-10-CM | POA: Diagnosis not present

## 2021-06-06 DIAGNOSIS — M1712 Unilateral primary osteoarthritis, left knee: Secondary | ICD-10-CM | POA: Diagnosis not present

## 2021-06-06 DIAGNOSIS — M25462 Effusion, left knee: Secondary | ICD-10-CM | POA: Diagnosis not present

## 2021-07-06 DIAGNOSIS — M25462 Effusion, left knee: Secondary | ICD-10-CM | POA: Diagnosis not present

## 2021-07-06 DIAGNOSIS — M1712 Unilateral primary osteoarthritis, left knee: Secondary | ICD-10-CM | POA: Diagnosis not present

## 2021-07-06 DIAGNOSIS — W010XXA Fall on same level from slipping, tripping and stumbling without subsequent striking against object, initial encounter: Secondary | ICD-10-CM | POA: Diagnosis not present

## 2021-07-06 DIAGNOSIS — M25562 Pain in left knee: Secondary | ICD-10-CM | POA: Diagnosis not present

## 2021-07-07 DIAGNOSIS — M25562 Pain in left knee: Secondary | ICD-10-CM | POA: Insufficient documentation

## 2021-07-07 DIAGNOSIS — M13862 Other specified arthritis, left knee: Secondary | ICD-10-CM | POA: Diagnosis not present

## 2021-07-07 DIAGNOSIS — M1712 Unilateral primary osteoarthritis, left knee: Secondary | ICD-10-CM | POA: Insufficient documentation

## 2021-08-18 DIAGNOSIS — M1712 Unilateral primary osteoarthritis, left knee: Secondary | ICD-10-CM | POA: Diagnosis not present

## 2021-10-20 DIAGNOSIS — M13862 Other specified arthritis, left knee: Secondary | ICD-10-CM | POA: Diagnosis not present

## 2021-10-30 ENCOUNTER — Encounter: Payer: Self-pay | Admitting: Podiatry

## 2021-10-30 ENCOUNTER — Ambulatory Visit (INDEPENDENT_AMBULATORY_CARE_PROVIDER_SITE_OTHER): Payer: Medicare Other | Admitting: Podiatry

## 2021-10-30 DIAGNOSIS — M7752 Other enthesopathy of left foot: Secondary | ICD-10-CM

## 2021-10-30 DIAGNOSIS — D2372 Other benign neoplasm of skin of left lower limb, including hip: Secondary | ICD-10-CM

## 2021-10-30 MED ORDER — DEXAMETHASONE SODIUM PHOSPHATE 120 MG/30ML IJ SOLN
2.0000 mg | Freq: Once | INTRAMUSCULAR | Status: AC
  Administered 2021-10-30: 2 mg via INTRA_ARTICULAR

## 2021-10-30 NOTE — Progress Notes (Signed)
She presents today chief complaint of a painful area beneath her fifth metatarsal head of her left foot.  States that is been about a year since I was here last I guess were going to have to make this a yearly thing. ? ?Objective: Vital signs are stable alert and oriented x3.  Pulses are palpable.  She has a painful area beneath the fifth metatarsal of the left foot resulting in a fluctuant mass beneath the fifth metatarsal head.  Overlying this mass Arst multiple small benign hyperkeratotic skin lesions. ? ?Assessment: Bursitis beneath the fifth metatarsal head left foot with overlying benign skin lesions. ? ?Plan: Debridement of benign skin lesions.  Injection of dexamethasone and local anesthetic into the bursa today.  She tolerated procedure well I will follow-up with her as needed. ?

## 2021-11-01 ENCOUNTER — Telehealth: Payer: Self-pay

## 2021-11-01 DIAGNOSIS — I10 Essential (primary) hypertension: Secondary | ICD-10-CM

## 2021-11-01 MED ORDER — QUINAPRIL HCL 20 MG PO TABS: 20.0000 mg | ORAL_TABLET | Freq: Every day | ORAL | 0 refills | Status: DC

## 2021-11-01 NOTE — Telephone Encounter (Signed)
[facility] faxed refill request for the following medications:  quinapril (ACCUPRIL) 20 MG tablet   Please advise.

## 2021-11-08 ENCOUNTER — Ambulatory Visit (INDEPENDENT_AMBULATORY_CARE_PROVIDER_SITE_OTHER): Payer: Medicare Other

## 2021-11-08 VITALS — BP 140/82 | Ht 68.0 in | Wt 162.7 lb

## 2021-11-08 DIAGNOSIS — Z0000 Encounter for general adult medical examination without abnormal findings: Secondary | ICD-10-CM

## 2021-11-08 NOTE — Progress Notes (Signed)
Subjective:   Stacey Fitzgerald is a 81 y.o. female who presents for Medicare Annual (Subsequent) preventive examination.  Review of Systems           Objective:    Today's Vitals   11/08/21 1044 11/08/21 1051  BP: (!) 144/90 140/82  Weight: 165 lb 11.2 oz (75.2 kg) 162 lb 11.2 oz (73.8 kg)  Height: '5\' 4"'$  (1.626 m) '5\' 8"'$  (1.727 m)   Wrong statistics- pt is 162.7lb and 5'8" BP is 142/80  Body mass index is 24.74 kg/m. This is incorrect     11/23/2019   12:56 PM 09/16/2019   11:50 AM 08/29/2018   10:16 AM 08/13/2017    2:43 PM 02/04/2015    9:43 AM  Advanced Directives  Does Patient Have a Medical Advance Directive? Yes Yes Yes Yes Yes  Type of Advance Directive  Living will;Healthcare Power of [place];Living will Living will Stacey Fitzgerald;Living will  Does patient want to make changes to medical advance directive?     No - Patient declined  Copy of Stacey Fitzgerald in Chart?  No - copy requested No - copy requested      Current Medications (verified) Outpatient Encounter Medications as of 11/08/2021  Medication Sig   Acetaminophen (TYLENOL 8 HOUR ARTHRITIS PAIN PO) Take by mouth as needed.   diclofenac Sodium (VOLTAREN) 1 % GEL APPLY 2 GRAMS TO AFFECTED AREA 4 TIMES A DAY   levothyroxine (SYNTHROID) 50 MCG tablet Take 1 tablet (50 mcg total) by mouth daily.   quinapril (ACCUPRIL) 20 MG tablet Take 1 tablet (20 mg total) by mouth daily. Please schedule office visit before any future refill.   simvastatin (ZOCOR) 20 MG tablet Take 1 tablet (20 mg total) by mouth at bedtime.   No facility-administered encounter medications on file as of 11/08/2021.    Allergies (verified) Cephalexin   History: Past Medical History:  Diagnosis Date   Arthritis    Female cystocele    Hyperlipidemia    Hypertension    Hypothyroidism    Menopausal state    Obesity, unspecified    Pessary maintenance    Rectocele    Past  Surgical History:  Procedure Laterality Date   BREAST EXCISIONAL BIOPSY Left 1972   benign   BREAST SURGERY     benign cyst   COLONOSCOPY     COLONOSCOPY WITH PROPOFOL N/A 11/23/2019   Procedure: COLONOSCOPY WITH PROPOFOL;  Surgeon: Toledo, Benay Pike, MD;  Location: [facility] ENDOSCOPY;  Service: Gastroenterology;  Laterality: N/A;   miscarrage     TUBAL LIGATION     uterine pessary     Family History  Problem Relation Age of Onset   Diabetes Mother    Seizures Mother    CVA Father    Dementia Brother    Diabetes Maternal Grandfather    Mental illness Sister    Breast cancer Neg Hx    Social History   Socioeconomic History   Marital status: Married    Spouse name: Not on file   Number of children: 2   Years of education: Not on file   Highest education level: 12th grade  Occupational History   Occupation: retired  Tobacco Use   Smoking status: Never   Smokeless tobacco: Never  Vaping Use   Vaping Use: Never used  Substance and Sexual Activity   Alcohol use: Not Currently    Alcohol/week: 0.0 standard drinks   Drug use: No  Sexual activity: Not on file  Other Topics Concern   Not on file  Social History Narrative   Not on file   Social Determinants of Health   Financial Resource Strain: Not on file  Food Insecurity: Not on file  Transportation Needs: Not on file  Physical Activity: Not on file  Stress: Not on file  Social Connections: Not on file    Tobacco Counseling Counseling given: Not Answered   Clinical Intake:  Pre-visit preparation completed: Yes  Pain : No/denies pain     Nutritional Risks: None Diabetes: No  How often do you need to have someone help you when you read instructions, pamphlets, or other written materials from your doctor or pharmacy?: 1 - Never  Diabetic?no  Interpreter Needed?: No  Information entered by :: Kirke Shaggy, LPN   Activities of Daily Living    12/23/2020    9:38 AM  In your present state of health, do  you have any difficulty performing the following activities:  Hearing? 0  Vision? 0  Difficulty concentrating or making decisions? 0  Walking or climbing stairs? 0  Dressing or bathing? 0  Doing errands, shopping? 0    Patient Care Team: Chrismon, Vickki Muff, PA-C (Inactive) as PCP - General (Physician Assistant) Ward, Honor Loh, MD as Referring Physician (Obstetrics and Gynecology)  Indicate any recent Medical Services you may have received from other than [facility] providers in the past year (date may be approximate).     Assessment:   This is a routine wellness examination for Stacey Fitzgerald.  Hearing/Vision screen No results found.  Dietary issues and exercise activities discussed:     Goals Addressed   None    Depression Screen    12/23/2020    9:38 AM 05/17/2020   10:09 AM 04/19/2020    2:33 PM 10/24/2018    9:54 AM 08/29/2018   10:16 AM 05/30/2018    8:44 AM 08/13/2017    2:43 PM  PHQ 2/9 Scores  PHQ - 2 Score 0 0 0 0 0 0 0  PHQ- 9 Score 0  0        Fall Risk    12/23/2020    9:37 AM 05/17/2020   10:08 AM 04/19/2020    2:32 PM 09/16/2019   11:50 AM 10/24/2018    9:54 AM  Fall Risk   Falls in the past year? 0 0 0 0 0  Number falls in past yr: 0  0 0   Injury with Fall? 0  0 0   Risk for fall due to : No Fall Risks      Follow up Falls evaluation completed        FALL RISK PREVENTION PERTAINING TO THE HOME:  Any stairs in or around the home? Yes  If so, are there any without handrails? No  Home free of loose throw rugs in walkways, pet beds, electrical cords, etc? Yes  Adequate lighting in your home to reduce risk of falls? Yes   ASSISTIVE DEVICES UTILIZED TO PREVENT FALLS:  Life alert? No  Use of a cane, walker or w/c? Yes  Grab bars in the bathroom? Yes  Shower chair or bench in shower? Yes  Elevated toilet seat or a handicapped toilet? Yes   TIMED UP AND GO:  Was the test performed? Yes .  Length of time to ambulate 10 feet: 4 sec.   Gait steady and fast  without use of assistive device  Cognitive Function:  Immunizations Immunization History  Administered Date(s) Administered   [employer] Vaccination 08/04/2019, 09/01/2019    TDAP status: Due, Education has been provided regarding the importance of this vaccine. Advised may receive this vaccine at local pharmacy or Health Dept. Aware to provide a copy of the vaccination record if obtained from local pharmacy or Health Dept. Verbalized acceptance and understanding.  Flu Vaccine status: Declined, Education has been provided regarding the importance of this vaccine but patient still declined. Advised may receive this vaccine at local pharmacy or Health Dept. Aware to provide a copy of the vaccination record if obtained from local pharmacy or Health Dept. Verbalized acceptance and understanding.  Pneumococcal vaccine status: Declined,  Education has been provided regarding the importance of this vaccine but patient still declined. Advised may receive this vaccine at local pharmacy or Health Dept. Aware to provide a copy of the vaccination record if obtained from local pharmacy or Health Dept. Verbalized acceptance and understanding.   Covid-19 vaccine status: Completed vaccines  Qualifies for Shingles Vaccine? No   Zostavax completed No   Shingrix Completed?: No.    Education has been provided regarding the importance of this vaccine. Patient has been advised to call insurance company to determine out of pocket expense if they have not yet received this vaccine. Advised may also receive vaccine at local pharmacy or Health Dept. Verbalized acceptance and understanding.  Screening Tests Health Maintenance  Topic Date Due   TETANUS/TDAP  Never done   Zoster Vaccines- Shingrix (1 of 2) Never done   Pneumonia Vaccine 43+ Years old (1 - PCV) Never done   COVID-19 Vaccine (3 - Moderna risk series) 09/29/2019   DEXA SCAN  02/14/2021   INFLUENZA VACCINE  02/20/2022   COLONOSCOPY  (Pts 45-43yr Insurance coverage will need to be confirmed)  11/22/2024   HPV VACCINES  Aged Out    Health Maintenance  Health Maintenance Due  Topic Date Due   TETANUS/TDAP  Never done   Zoster Vaccines- Shingrix (1 of 2) Never done   Pneumonia Vaccine 80 Years old (1 - PCV) Never done   COVID-19 Vaccine (3 - Moderna risk series) 09/29/2019   DEXA SCAN  02/14/2021    Colorectal cancer screening: No longer required.   Mammogram status: No longer required due to age.  Bone Density status: Completed 02/15/16. Results reflect: Bone density results: NORMAL. Repeat every 5 years.- declined referral  Lung Cancer Screening: (Low Dose CT Chest recommended if Age 10139-80years, 30 pack-year currently smoking OR have quit w/in 15years.) does not qualify.   Additional Screening:  Hepatitis C Screening: does not qualify; Completed no  Vision Screening: Recommended annual ophthalmology exams for early detection of glaucoma and other disorders of the eye. Is the patient up to date with their annual eye exam?  Yes  Who is the provider or what is the name of the office in which the patient attends annual eye exams? [facility] If pt is not established with a provider, would they like to be referred to a provider to establish care? No .   Dental Screening: Recommended annual dental exams for proper oral hygiene  Community Resource Referral / Chronic Care Management: CRR required this visit?  No   CCM required this visit?  No      Plan:     I have personally reviewed and noted the following in the patient's chart:   Medical and social history Use of alcohol, tobacco or illicit drugs  Current medications  and supplements including opioid prescriptions.  Functional ability and status Nutritional status Physical activity Advanced directives List of other physicians Hospitalizations, surgeries, and ER visits in previous 12 months Vitals Screenings to include cognitive, depression,  and falls Referrals and appointments  In addition, I have reviewed and discussed with patient certain preventive protocols, quality metrics, and best practice recommendations. A written personalized care plan for preventive services as well as general preventive health recommendations were provided to patient.     Dionisio David, LPN   4/72/0721   Nurse Notes: none

## 2021-11-08 NOTE — Patient Instructions (Addendum)
Stacey Fitzgerald , ?Thank you for taking time to come for your Medicare Wellness Visit. I appreciate your ongoing commitment to your health goals. Please review the following plan we discussed and let me know if I can assist you in the future.  ? ?Screening recommendations/referrals: ?Colonoscopy: aged out ?Mammogram: aged out ?Bone Density: 02/15/16, declined referral ?Recommended yearly ophthalmology/optometry visit for glaucoma screening and checkup ?Recommended yearly dental visit for hygiene and checkup ? ?Vaccinations: ?Influenza vaccine: n/d ?Pneumococcal vaccine: n/d ?Tdap vaccine: n/d ?Shingles vaccine: n/d   ?Covid-19:08/04/19, 09/01/19 ? ?Advanced directives: no ? ?Conditions/risks identified: none ? ?Next appointment: Follow up in one year for your annual wellness visit 11/12/22 @ 10:15am in person ? ? ?Preventive Care 3 Years and Older, Female ?Preventive care refers to lifestyle choices and visits with your health care provider that can promote health and wellness. ?What does preventive care include? ?A yearly physical exam. This is also called an annual well check. ?Dental exams once or twice a year. ?Routine eye exams. Ask your health care provider how often you should have your eyes checked. ?Personal lifestyle choices, including: ?Daily care of your teeth and gums. ?Regular physical activity. ?Eating a healthy diet. ?Avoiding tobacco and drug use. ?Limiting alcohol use. ?Practicing safe sex. ?Taking low-dose aspirin every day. ?Taking vitamin and mineral supplements as recommended by your health care provider. ?What happens during an annual well check? ?The services and screenings done by your health care provider during your annual well check will depend on your age, overall health, lifestyle risk factors, and family history of disease. ?Counseling  ?Your health care provider may ask you questions about your: ?Alcohol use. ?Tobacco use. ?Drug use. ?Emotional well-being. ?Home and relationship  well-being. ?Sexual activity. ?Eating habits. ?History of falls. ?Memory and ability to understand (cognition). ?Work and work Statistician. ?Reproductive health. ?Screening  ?You may have the following tests or measurements: ?Height, weight, and BMI. ?Blood pressure. ?Lipid and cholesterol levels. These may be checked every 5 years, or more frequently if you are over 71 years old. ?Skin check. ?Lung cancer screening. You may have this screening every year starting at age 60 if you have a 30-pack-year history of smoking and currently smoke or have quit within the past 15 years. ?Fecal occult blood test (FOBT) of the stool. You may have this test every year starting at age 40. ?Flexible sigmoidoscopy or colonoscopy. You may have a sigmoidoscopy every 5 years or a colonoscopy every 10 years starting at age 94. ?Hepatitis C blood test. ?Hepatitis B blood test. ?Sexually transmitted disease (STD) testing. ?Diabetes screening. This is done by checking your blood sugar (glucose) after you have not eaten for a while (fasting). You may have this done every 1-3 years. ?Bone density scan. This is done to screen for osteoporosis. You may have this done starting at age 24. ?Mammogram. This may be done every 1-2 years. Talk to your health care provider about how often you should have regular mammograms. ?Talk with your health care provider about your test results, treatment options, and if necessary, the need for more tests. ?Vaccines  ?Your health care provider may recommend certain vaccines, such as: ?Influenza vaccine. This is recommended every year. ?Tetanus, diphtheria, and acellular pertussis (Tdap, Td) vaccine. You may need a Td booster every 10 years. ?Zoster vaccine. You may need this after age 19. ?Pneumococcal 13-valent conjugate (PCV13) vaccine. One dose is recommended after age 49. ?Pneumococcal polysaccharide (PPSV23) vaccine. One dose is recommended after age 76. ?Talk to your  health care provider about which  screenings and vaccines you need and how often you need them. ?This information is not intended to replace advice given to you by your health care provider. Make sure you discuss any questions you have with your health care provider. ?Document Released: 08/05/2015 Document Revised: 03/28/2016 Document Reviewed: 05/10/2015 ?Elsevier Interactive Patient Education ? 2017 [place]. ? ?Fall Prevention in the Home ?Falls can cause injuries. They can happen to people of all ages. There are many things you can do to make your home safe and to help prevent falls. ?What can I do on the outside of my home? ?Regularly fix the edges of walkways and driveways and fix any cracks. ?Remove anything that might make you trip as you walk through a door, such as a raised step or threshold. ?Trim any bushes or trees on the path to your home. ?Use bright outdoor lighting. ?Clear any walking paths of anything that might make someone trip, such as rocks or tools. ?Regularly check to see if handrails are loose or broken. Make sure that both sides of any steps have handrails. ?Any raised decks and porches should have guardrails on the edges. ?Have any leaves, snow, or ice cleared regularly. ?Use sand or salt on walking paths during winter. ?Clean up any spills in your garage right away. This includes oil or grease spills. ?What can I do in the bathroom? ?Use night lights. ?Install grab bars by the toilet and in the tub and shower. Do not use towel bars as grab bars. ?Use non-skid mats or decals in the tub or shower. ?If you need to sit down in the shower, use a plastic, non-slip stool. ?Keep the floor dry. Clean up any water that spills on the floor as soon as it happens. ?Remove soap buildup in the tub or shower regularly. ?Attach bath mats securely with double-sided non-slip rug tape. ?Do not have throw rugs and other things on the floor that can make you trip. ?What can I do in the bedroom? ?Use night lights. ?Make sure that you have a  light by your bed that is easy to reach. ?Do not use any sheets or blankets that are too big for your bed. They should not hang down onto the floor. ?Have a firm chair that has side arms. You can use this for support while you get dressed. ?Do not have throw rugs and other things on the floor that can make you trip. ?What can I do in the kitchen? ?Clean up any spills right away. ?Avoid walking on wet floors. ?Keep items that you use a lot in easy-to-reach places. ?If you need to reach something above you, use a strong step stool that has a grab bar. ?Keep electrical cords out of the way. ?Do not use floor polish or wax that makes floors slippery. If you must use wax, use non-skid floor wax. ?Do not have throw rugs and other things on the floor that can make you trip. ?What can I do with my stairs? ?Do not leave any items on the stairs. ?Make sure that there are handrails on both sides of the stairs and use them. Fix handrails that are broken or loose. Make sure that handrails are as long as the stairways. ?Check any carpeting to make sure that it is firmly attached to the stairs. Fix any carpet that is loose or worn. ?Avoid having throw rugs at the top or bottom of the stairs. If you do have throw rugs, attach them  to the floor with carpet tape. ?Make sure that you have a light switch at the top of the stairs and the bottom of the stairs. If you do not have them, ask someone to add them for you. ?What else can I do to help prevent falls? ?Wear shoes that: ?Do not have high heels. ?Have rubber bottoms. ?Are comfortable and fit you well. ?Are closed at the toe. Do not wear sandals. ?If you use a stepladder: ?Make sure that it is fully opened. Do not climb a closed stepladder. ?Make sure that both sides of the stepladder are locked into place. ?Ask someone to hold it for you, if possible. ?Clearly mark and make sure that you can see: ?Any grab bars or handrails. ?First and last steps. ?Where the edge of each step  is. ?Use tools that help you move around (mobility aids) if they are needed. These include: ?Canes. ?Walkers. ?Scooters. ?Crutches. ?Turn on the lights when you go into a dark area. Replace any light bulbs as soon as they

## 2021-11-14 DIAGNOSIS — Z4689 Encounter for fitting and adjustment of other specified devices: Secondary | ICD-10-CM | POA: Diagnosis not present

## 2021-11-17 ENCOUNTER — Ambulatory Visit: Payer: Medicare Other | Admitting: Physician Assistant

## 2021-11-21 NOTE — Progress Notes (Deleted)
I,Joseline E Rosas,acting as a scribe for [employer], PA-C.,have documented all relevant documentation on the behalf of [name], PA-C,as directed by  [name], PA-C while in the presence of [name], PA-C.   Established patient visit   Patient: Stacey Fitzgerald   DOB: [birth year]   81 y.o. Female  MRN: [id number] Visit Date: 11/22/2021  Today's healthcare provider: [name], PA-C   No chief complaint on file.  Subjective    HPI  Patient is being seen today for chronic care follow up. Last seen 12/23/20. AWV done 11/08/21. Patient would like updated blood work done. Hypertension, follow-up  BP Readings from Last 3 Encounters:  11/08/21 140/82  12/23/20 (!) 149/76  05/17/20 128/84   Wt Readings from Last 3 Encounters:  11/08/21 162 lb 11.2 oz (73.8 kg)  12/23/20 167 lb (75.8 kg)  05/17/20 164 lb (74.4 kg)     She was last seen for hypertension 11 months ago.  BP at that visit was 149/76. Management since that visit includes Quinapril 20 mg qd  She reports {excellent/good/fair/poor:19665} compliance with treatment. She {is/is not:9024} having side effects.  She is following a {diet:21022986} diet. She {is/is not:9024} exercising. She does not smoke.  Outside blood pressures are {***enter patient reported home BP readings, or 'not being checked':1}. Symptoms: {Yes/No:20286} chest pain {Yes/No:20286} chest pressure  {Yes/No:20286} palpitations {Yes/No:20286} syncope  {Yes/No:20286} dyspnea {Yes/No:20286} orthopnea  {Yes/No:20286} paroxysmal nocturnal dyspnea {Yes/No:20286} lower extremity edema   Pertinent labs Lab Results  Component Value Date   CHOL 220 (H) 12/23/2020   HDL 44 12/23/2020   LDLCALC 142 (H) 12/23/2020   TRIG 190 (H) 12/23/2020   CHOLHDL 5.0 (H) 12/23/2020   Lab Results  Component Value Date   NA 143 12/23/2020   K 4.7 12/23/2020   CREATININE 1.15 (H) 12/23/2020   EGFR 48 (L) 12/23/2020   GLUCOSE 91 12/23/2020    TSH 2.570 12/23/2020     The ASCVD Risk score (Arnett DK, et al., 2019) failed to calculate for the following reasons:   The 2019 ASCVD risk score is only valid for ages 48 to 26  ---------------------------------------------------------------------------------------------------  Hypothyroid, follow-up  Lab Results  Component Value Date   TSH 2.570 12/23/2020   TSH 2.860 05/31/2020   TSH 2.230 04/05/2020   T4TOTAL 8.7 12/23/2020   T4TOTAL 9.2 09/11/2018    Wt Readings from Last 3 Encounters:  11/08/21 162 lb 11.2 oz (73.8 kg)  12/23/20 167 lb (75.8 kg)  05/17/20 164 lb (74.4 kg)    She was last seen for hypothyroid 11 months ago.  Management since that visit includes continue current treatment. She reports {excellent/good/fair/poor:19665} compliance with treatment. She {is/is not:21021397} having side effects.   Symptoms: {Yes/No:20286} change in energy level {Yes/No:20286} constipation  {Yes/No:20286} diarrhea {Yes/No:20286} heat / cold intolerance  {Yes/No:20286} nervousness {Yes/No:20286} palpitations  {Yes/No:20286} weight changes    ----------------------------------------------------------------------------------------- Lipid/Cholesterol, Follow-up  Last lipid panel Other pertinent labs  Lab Results  Component Value Date   CHOL 220 (H) 12/23/2020   HDL 44 12/23/2020   LDLCALC 142 (H) 12/23/2020   TRIG 190 (H) 12/23/2020   CHOLHDL 5.0 (H) 12/23/2020   Lab Results  Component Value Date   ALT 13 12/23/2020   AST 8 12/23/2020   PLT 331 12/23/2020   TSH 2.570 12/23/2020     She was last seen for this 11 months ago.  Management since that visit includes continue current treatment.  She reports {excellent/good/fair/poor:19665} compliance with  treatment. She {is/is not:9024} having side effects.   Symptoms: {Yes/No:20286} chest pain {Yes/No:20286} chest pressure/discomfort  {Yes/No:20286} dyspnea {Yes/No:20286} lower extremity edema  {Yes/No:20286}  numbness or tingling of extremity {Yes/No:20286} orthopnea  {Yes/No:20286} palpitations {Yes/No:20286} paroxysmal nocturnal dyspnea  {Yes/No:20286} speech difficulty {Yes/No:20286} syncope    The ASCVD Risk score (Arnett DK, et al., 2019) failed to calculate for the following reasons:   The 2019 ASCVD risk score is only valid for ages 71 to 35  ---------------------------------------------------------------------------------------------------   Medications: Outpatient Medications Prior to Visit  Medication Sig   Acetaminophen (TYLENOL 8 HOUR ARTHRITIS PAIN PO) Take by mouth as needed.   diclofenac Sodium (VOLTAREN) 1 % GEL APPLY 2 GRAMS TO AFFECTED AREA 4 TIMES A DAY   levothyroxine (SYNTHROID) 50 MCG tablet Take 1 tablet (50 mcg total) by mouth daily. (Patient not taking: Reported on 11/08/2021)   quinapril (ACCUPRIL) 20 MG tablet Take 1 tablet (20 mg total) by mouth daily. Please schedule office visit before any future refill.   simvastatin (ZOCOR) 20 MG tablet Take 1 tablet (20 mg total) by mouth at bedtime.   No facility-administered medications prior to visit.    Review of Systems  {Labs  Heme  Chem  Endocrine  Serology  Results Review (optional):23779}   Objective    There were no vitals taken for this visit. {Show previous vital signs (optional):23777}  Physical Exam  ***  No results found for any visits on 11/22/21.  Assessment & Plan     ***  No follow-ups on file.      {provider attestation***:1}   [name], PA-C  [facility] [phone] (phone) [phone] (fax)  [place]

## 2021-11-22 ENCOUNTER — Other Ambulatory Visit: Payer: Self-pay | Admitting: Family Medicine

## 2021-11-22 ENCOUNTER — Ambulatory Visit: Payer: Medicare Other | Admitting: Physician Assistant

## 2021-11-22 DIAGNOSIS — I10 Essential (primary) hypertension: Secondary | ICD-10-CM | POA: Diagnosis not present

## 2021-11-22 DIAGNOSIS — E785 Hyperlipidemia, unspecified: Secondary | ICD-10-CM | POA: Diagnosis not present

## 2021-11-22 DIAGNOSIS — E039 Hypothyroidism, unspecified: Secondary | ICD-10-CM | POA: Diagnosis not present

## 2021-11-23 LAB — CBC
Hematocrit: 41.9 % (ref 34.0–46.6)
Hemoglobin: 13.6 g/dL (ref 11.1–15.9)
MCH: 28 pg (ref 26.6–33.0)
MCHC: 32.5 g/dL (ref 31.5–35.7)
MCV: 86 fL (ref 79–97)
Platelets: 324 10*3/uL (ref 150–450)
RBC: 4.85 x10E6/uL (ref 3.77–5.28)
RDW: 12.7 % (ref 11.7–15.4)
WBC: 8.2 10*3/uL (ref 3.4–10.8)

## 2021-11-23 LAB — COMPREHENSIVE METABOLIC PANEL
ALT: 9 IU/L (ref 0–32)
AST: 10 IU/L (ref 0–40)
Albumin/Globulin Ratio: 1.9 (ref 1.2–2.2)
Albumin: 4.2 g/dL (ref 3.7–4.7)
Alkaline Phosphatase: 73 IU/L (ref 44–121)
BUN/Creatinine Ratio: 15 (ref 12–28)
BUN: 15 mg/dL (ref 8–27)
Bilirubin Total: 0.4 mg/dL (ref 0.0–1.2)
CO2: 23 mmol/L (ref 20–29)
Calcium: 9.8 mg/dL (ref 8.7–10.3)
Chloride: 105 mmol/L (ref 96–106)
Creatinine, Ser: 0.99 mg/dL (ref 0.57–1.00)
Globulin, Total: 2.2 g/dL (ref 1.5–4.5)
Glucose: 91 mg/dL (ref 70–99)
Potassium: 4.3 mmol/L (ref 3.5–5.2)
Sodium: 144 mmol/L (ref 134–144)
Total Protein: 6.4 g/dL (ref 6.0–8.5)
eGFR: 58 mL/min/{1.73_m2} — ABNORMAL LOW (ref >59)

## 2021-11-23 LAB — TSH+FREE T4
Free T4: 1.18 ng/dL (ref 0.82–1.77)
TSH: 2.86 u[IU]/mL (ref 0.450–4.500)

## 2021-11-23 LAB — LIPID PANEL
Chol/HDL Ratio: 3.9 ratio (ref 0.0–4.4)
Cholesterol, Total: 177 mg/dL (ref 100–199)
HDL: 45 mg/dL (ref >39)
LDL Chol Calc (NIH): 105 mg/dL — ABNORMAL HIGH (ref 0–99)
Triglycerides: 153 mg/dL — ABNORMAL HIGH (ref 0–149)
VLDL Cholesterol Cal: 27 mg/dL (ref 5–40)

## 2021-11-24 ENCOUNTER — Telehealth: Payer: Self-pay

## 2021-11-24 NOTE — Telephone Encounter (Signed)
Pt wanted lab results. Gave pt the numbers and the range for glucose, lipid panel. Please review results. ?Pt would like hard copy of results send to her home address.  ?

## 2021-11-26 ENCOUNTER — Other Ambulatory Visit: Payer: Self-pay | Admitting: Physician Assistant

## 2021-11-26 DIAGNOSIS — I10 Essential (primary) hypertension: Secondary | ICD-10-CM

## 2021-11-27 ENCOUNTER — Other Ambulatory Visit: Payer: Self-pay | Admitting: Physician Assistant

## 2021-11-27 DIAGNOSIS — I10 Essential (primary) hypertension: Secondary | ICD-10-CM

## 2021-11-27 MED ORDER — LISINOPRIL 20 MG PO TABS: 20.0000 mg | ORAL_TABLET | Freq: Every day | ORAL | 0 refills | Status: DC

## 2021-11-27 NOTE — Progress Notes (Signed)
Quinapril no longer available. ? ?

## 2021-11-28 ENCOUNTER — Encounter: Payer: Self-pay | Admitting: Physician Assistant

## 2021-11-28 ENCOUNTER — Ambulatory Visit (INDEPENDENT_AMBULATORY_CARE_PROVIDER_SITE_OTHER): Payer: Medicare Other | Admitting: Physician Assistant

## 2021-11-28 VITALS — BP 191/75 | HR 87 | Wt 164.7 lb

## 2021-11-28 DIAGNOSIS — E039 Hypothyroidism, unspecified: Secondary | ICD-10-CM

## 2021-11-28 DIAGNOSIS — I10 Essential (primary) hypertension: Secondary | ICD-10-CM | POA: Diagnosis not present

## 2021-11-28 DIAGNOSIS — E785 Hyperlipidemia, unspecified: Secondary | ICD-10-CM

## 2021-11-28 MED ORDER — SIMVASTATIN 20 MG PO TABS: 20.0000 mg | ORAL_TABLET | Freq: Every day | ORAL | 3 refills | Status: DC

## 2021-11-28 MED ORDER — LISINOPRIL 20 MG PO TABS: 20.0000 mg | ORAL_TABLET | Freq: Every day | ORAL | 1 refills | Status: DC

## 2021-11-28 NOTE — Assessment & Plan Note (Signed)
Managed with simvastatin 20 mg, moderately controlled will continue dose ?

## 2021-11-28 NOTE — Assessment & Plan Note (Signed)
Pt is not currently medicated and TSH/t4 wnl ?Will remove from med list ?

## 2021-11-28 NOTE — Assessment & Plan Note (Signed)
Elevated but currently unmedicated ?Switched quinapril 20 mg to lisinopril 20 mg  ?F/u 4-6 weeks for bp check ?

## 2021-11-28 NOTE — Progress Notes (Signed)
?  ? ?I,[name],acting as a Education administrator for [employer], PA-C.,have documented all relevant documentation on the behalf of [name], PA-C,as directed by  [name], PA-C while in the presence of [name], PA-C. ? ? ?Established patient visit ? ? ?Patient: Stacey Fitzgerald   DOB: 1941/04/21   81 y.o. Female  MRN: [id number] ?Visit Date: 11/28/2021 ? ?Today's healthcare provider: [name], PA-C  ? ?Cc. Chronic care f/u ? ?Subjective  ?  ?HPI  ?Patient recently had labs done 11/22/21 ?Hypertension, follow-up ? ?BP Readings from Last 3 Encounters:  ?11/28/21 (!) 191/75  ?11/08/21 140/82  ?12/23/20 (!) 149/76  ? Wt Readings from Last 3 Encounters:  ?11/28/21 164 lb 11.2 oz (74.7 kg)  ?11/08/21 162 lb 11.2 oz (73.8 kg)  ?12/23/20 167 lb (75.8 kg)  ?  ? ?She was last seen for hypertension 11 months ago.  ?Pt was taking Quinapril 20 mg, med has been d/c. Has been without for a few days. ? ?She is not having side effects.  ?She is following a Regular diet. ?She is exercising. ?She does not smoke. ? ?Use of agents associated with hypertension: none.  ? ?Outside blood pressures are yes 180/80-190/80-85. 176/92 ?Symptoms: ?No chest pain No chest pressure  ?No palpitations No syncope  ?No dyspnea No orthopnea  ?No paroxysmal nocturnal dyspnea No lower extremity edema  ? ?Pertinent labs ?Lab Results  ?Component Value Date  ? CHOL 177 11/22/2021  ? HDL 45 11/22/2021  ? LDLCALC 105 (H) 11/22/2021  ? TRIG 153 (H) 11/22/2021  ? CHOLHDL 3.9 11/22/2021  ? Lab Results  ?Component Value Date  ? NA 144 11/22/2021  ? K 4.3 11/22/2021  ? CREATININE 0.99 11/22/2021  ? EGFR 58 (L) 11/22/2021  ? GLUCOSE 91 11/22/2021  ? TSH 2.860 11/22/2021  ?  ? ?The ASCVD Risk score (Arnett DK, et al., 2019) failed to calculate for the following reasons: ?  The 2019 ASCVD risk score is only valid for ages 53 to 58 ? ?--------------------------------------------------------------------------------------------------- ?Hypothyroid,  follow-up ? ?Lab Results  ?Component Value Date  ? TSH 2.860 11/22/2021  ? TSH 2.570 12/23/2020  ? TSH 2.860 05/31/2020  ? FREET4 1.18 11/22/2021  ? T4TOTAL 8.7 12/23/2020  ? T4TOTAL 9.2 09/11/2018  ? ? ?Wt Readings from Last 3 Encounters:  ?11/28/21 164 lb 11.2 oz (74.7 kg)  ?11/08/21 162 lb 11.2 oz (73.8 kg)  ?12/23/20 167 lb (75.8 kg)  ? ? ?She was last seen for hypothyroid 11 months ago.  ?Management since that visit includes continue levothyroxine 50 mcg. ?She reports excellent compliance with treatment. ?She is not having side effects. ? ?Symptoms: ?No change in energy level No constipation  ?No diarrhea No heat / cold intolerance  ?No nervousness No palpitations  ?No weight changes   ? ?-----------------------------------------------------------------------------------------  ?Lipid/Cholesterol, Follow-up ? ?Last lipid panel Other pertinent labs  ?Lab Results  ?Component Value Date  ? CHOL 177 11/22/2021  ? HDL 45 11/22/2021  ? LDLCALC 105 (H) 11/22/2021  ? TRIG 153 (H) 11/22/2021  ? CHOLHDL 3.9 11/22/2021  ? Lab Results  ?Component Value Date  ? ALT 9 11/22/2021  ? AST 10 11/22/2021  ? PLT 324 11/22/2021  ? TSH 2.860 11/22/2021  ?  ? ?She was last seen for this 11 months ago.  ?Management since that visit includes cancel rosuvastatin and change to simvastatin 20 mg qd. ? ?She reports excellent compliance with treatment. ?She is not having side effects.  ? ?Symptoms: ?No chest  pain No chest pressure/discomfort  ?No dyspnea No lower extremity edema  ?No numbness or tingling of extremity No orthopnea  ?No palpitations No paroxysmal nocturnal dyspnea  ?No speech difficulty No syncope  ? ?Current diet: in general, a "healthy" diet   ?Current exercise: walking and yard work ? ?The ASCVD Risk score (Arnett DK, et al., 2019) failed to calculate for the following reasons: ?  The 2019 ASCVD risk score is only valid for ages 68 to  44 ? ?---------------------------------------------------------------------------------------------------  ? ?Medications: ?Outpatient Medications Prior to Visit  ?Medication Sig  ? Acetaminophen (TYLENOL 8 HOUR ARTHRITIS PAIN PO) Take by mouth as needed.  ? [DISCONTINUED] levothyroxine (SYNTHROID) 50 MCG tablet Take 1 tablet (50 mcg total) by mouth daily.  ? [DISCONTINUED] lisinopril (ZESTRIL) 20 MG tablet Take 1 tablet (20 mg total) by mouth daily.  ? [DISCONTINUED] simvastatin (ZOCOR) 20 MG tablet Take 1 tablet (20 mg total) by mouth at bedtime.  ? diclofenac Sodium (VOLTAREN) 1 % GEL APPLY 2 GRAMS TO AFFECTED AREA 4 TIMES A DAY (Patient not taking: Reported on 11/28/2021)  ? ?No facility-administered medications prior to visit.  ? ? ?Review of Systems  ?Constitutional:  Negative for fatigue and fever.  ?Respiratory:  Negative for cough and shortness of breath.   ?Cardiovascular:  Negative for chest pain and leg swelling.  ?Gastrointestinal:  Negative for abdominal pain.  ?Neurological:  Negative for dizziness and headaches.  ? ? ?  Objective  ?  ?Blood pressure (!) 191/75, pulse 87, weight 164 lb 11.2 oz (74.7 kg), SpO2 97 %.  ? ?Physical Exam ?Constitutional:   ?   General: She is awake.  ?   Appearance: She is well-developed.  ?HENT:  ?   Head: Normocephalic.  ?Eyes:  ?   Conjunctiva/sclera: Conjunctivae normal.  ?Cardiovascular:  ?   Rate and Rhythm: Normal rate and regular rhythm.  ?   Heart sounds: Normal heart sounds.  ?Pulmonary:  ?   Effort: Pulmonary effort is normal.  ?   Breath sounds: Normal breath sounds.  ?Musculoskeletal:  ?   Right lower leg: No edema.  ?   Left lower leg: No edema.  ?Skin: ?   General: Skin is warm.  ?Neurological:  ?   Mental Status: She is alert and oriented to person, place, and time.  ?Psychiatric:     ?   Attention and Perception: Attention normal.     ?   Mood and Affect: Mood normal.     ?   Speech: Speech normal.     ?   Behavior: Behavior is cooperative.  ?  ? ?No results  found for any visits on 11/28/21. ? Assessment & Plan  ?  ? ?Problem List Items Addressed This Visit   ? ?  ? Cardiovascular and Mediastinum  ? Essential hypertension  ?  Elevated but currently unmedicated ?Switched quinapril 20 mg to lisinopril 20 mg  ?F/u 4-6 weeks for bp check ? ?  ?  ? Relevant Medications  ? lisinopril (ZESTRIL) 20 MG tablet  ? simvastatin (ZOCOR) 20 MG tablet  ?  ? Endocrine  ? Hypothyroidism - Primary  ?  Pt is not currently medicated and TSH/t4 wnl ?Will remove from med list ? ?  ?  ?  ? Other  ? HLD (hyperlipidemia)  ?  Managed with simvastatin 20 mg, moderately controlled will continue dose ? ?  ?  ? Relevant Medications  ? lisinopril (ZESTRIL) 20 MG tablet  ? simvastatin (ZOCOR)  20 MG tablet  ?  ?Return in about 4 weeks (around 12/26/2021) for hypertension.  ?   ? ?I, [name], PA-C have reviewed all documentation for this visit. The documentation on 11/28/2021 for the exam, diagnosis, procedures, and orders are all accurate and complete. ? ?[name], PA-C ?[place] [id number] ?[place], [place], [address] ?Office: [phone] ?Fax: [phone]  ? ?[facility] ? ?

## 2021-11-29 IMAGING — MG DIGITAL SCREENING BILAT W/ TOMO W/ CAD
6 of 10 series · 6 of 30 positions shown · non-contrast
Comparison: Previous exam(s).

CLINICAL DATA: Screening.

EXAM:
DIGITAL SCREENING BILATERAL MAMMOGRAM WITH TOMO AND CAD

[L MLO synth-2D (1 of 2)]
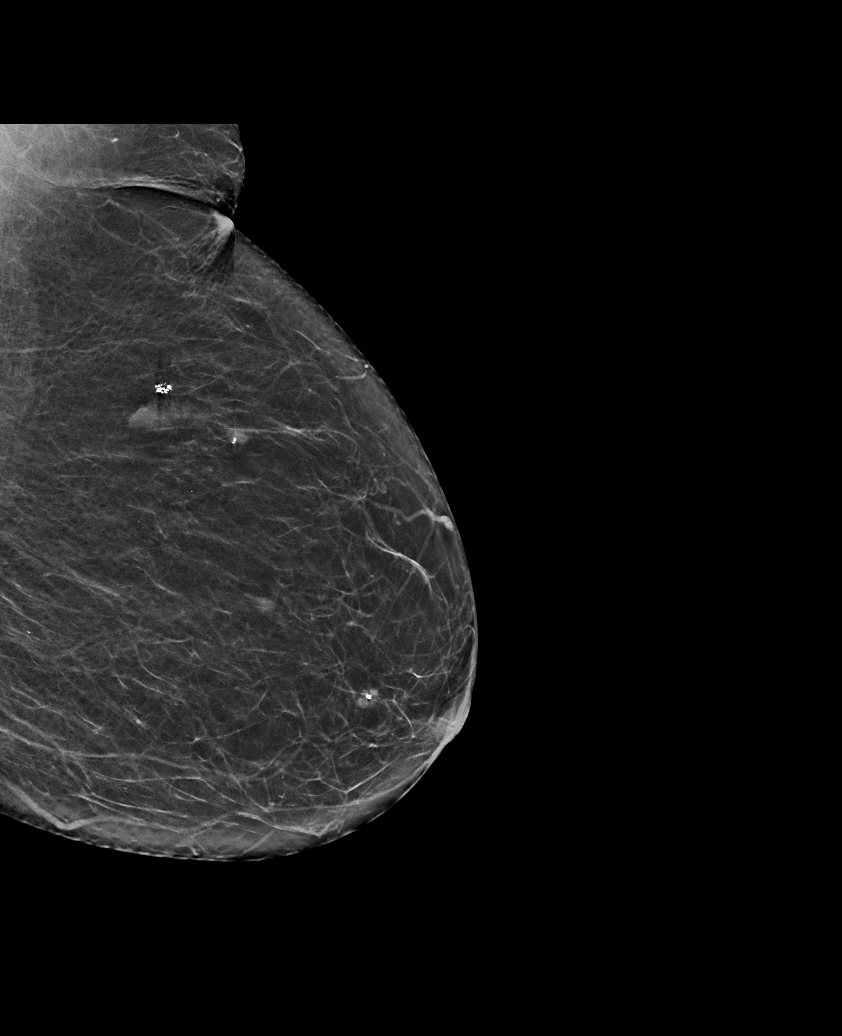

[R MLO synth-2D]
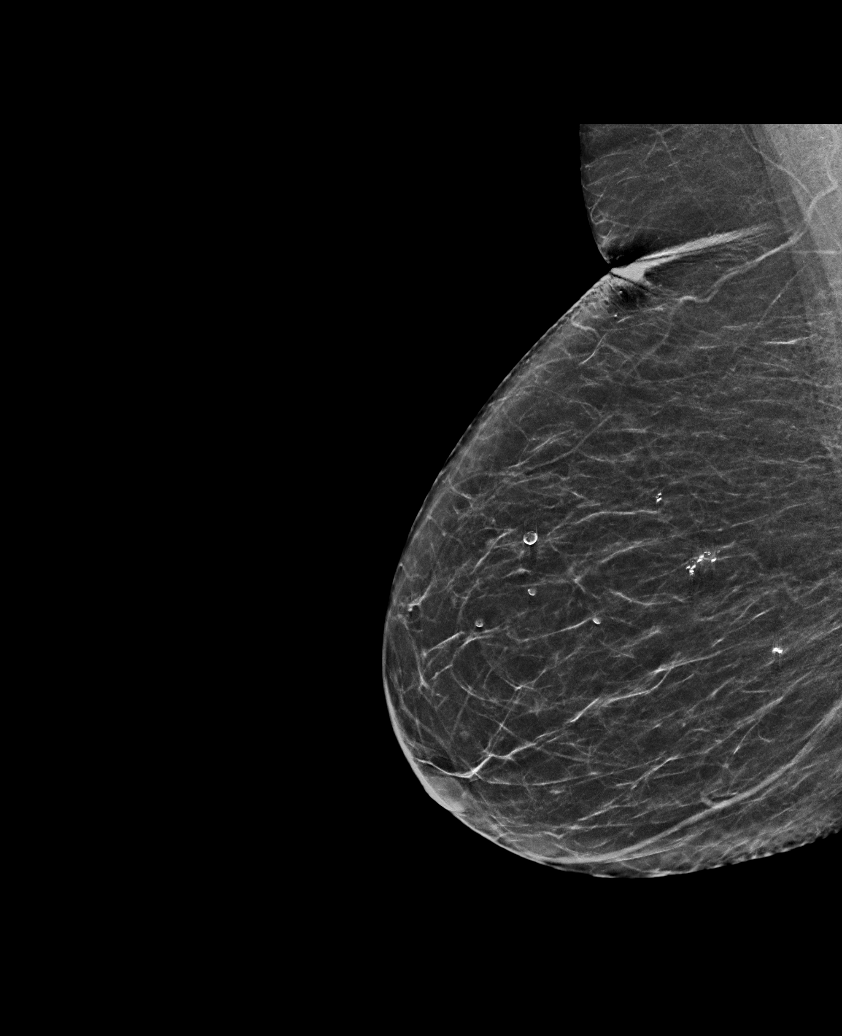

[L CC synth-2D]
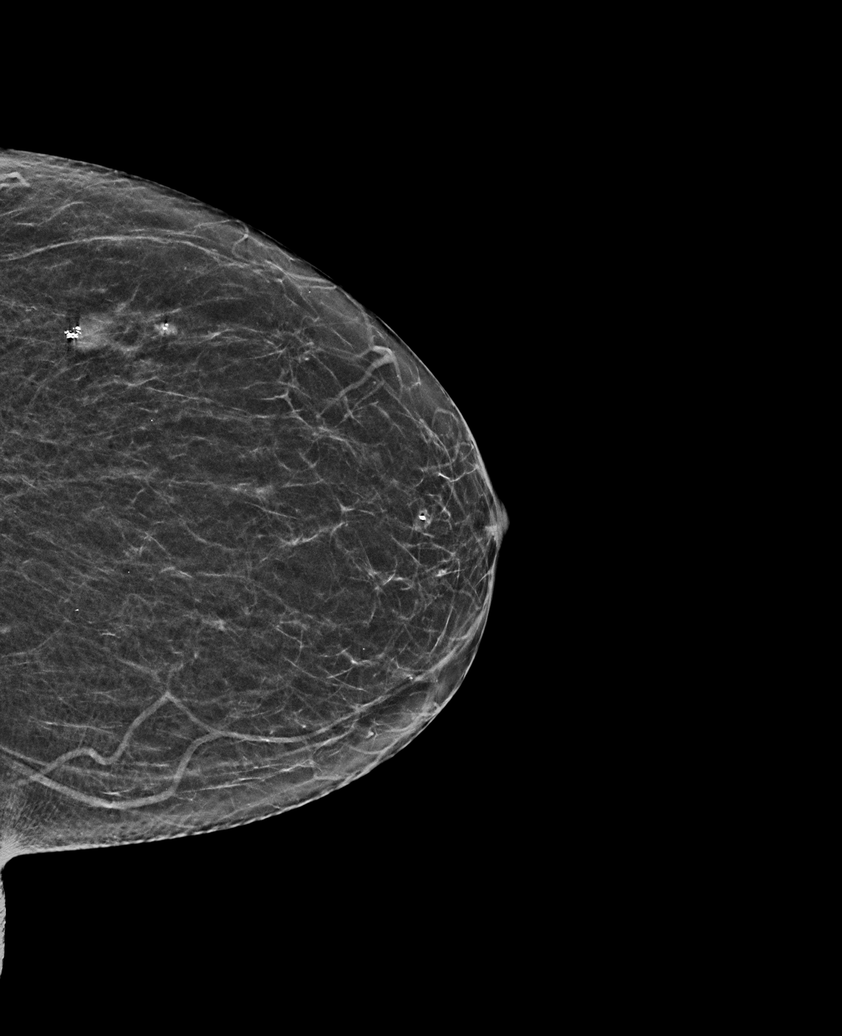

[R CC synth-2D]
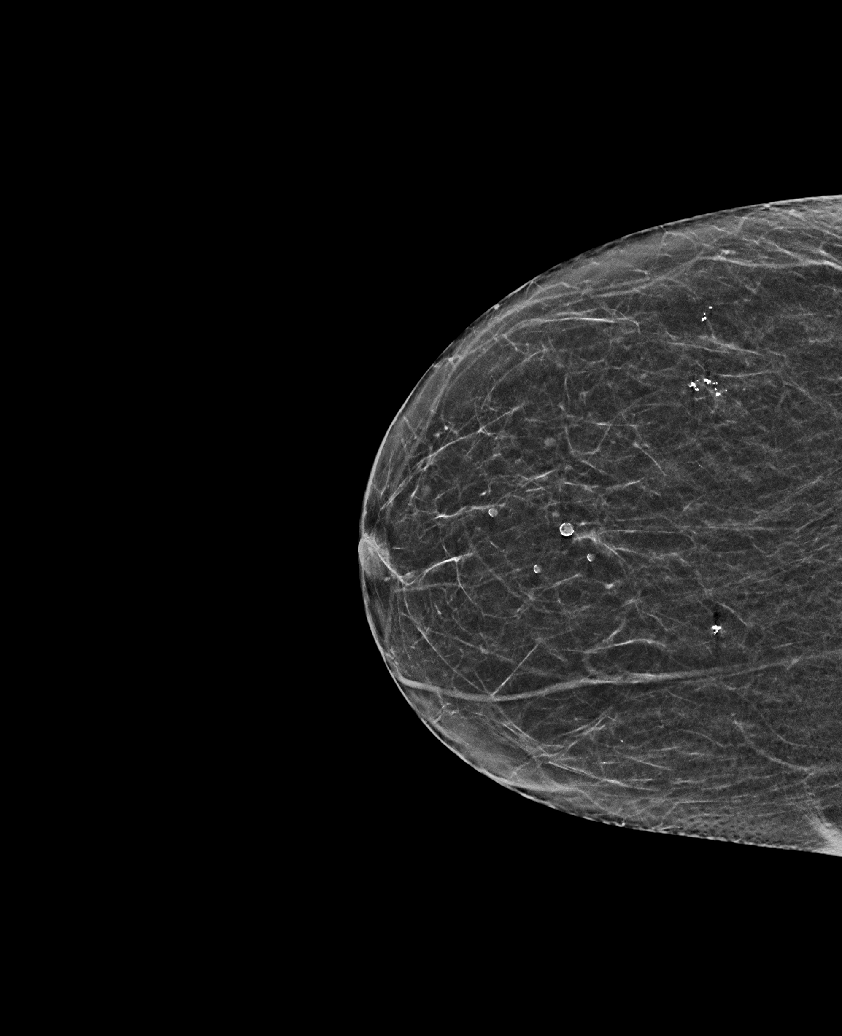

[L MLO synth-2D (2 of 2)]
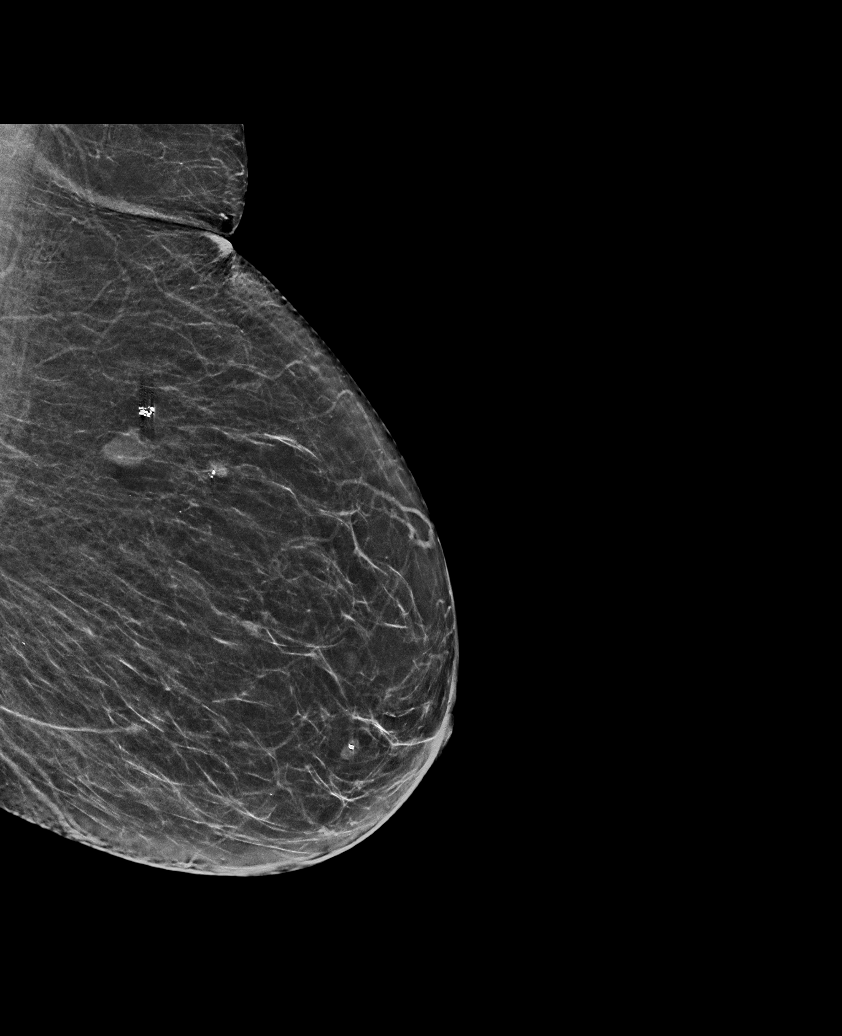

[L CC tomo · tomo slice 25/49.0]
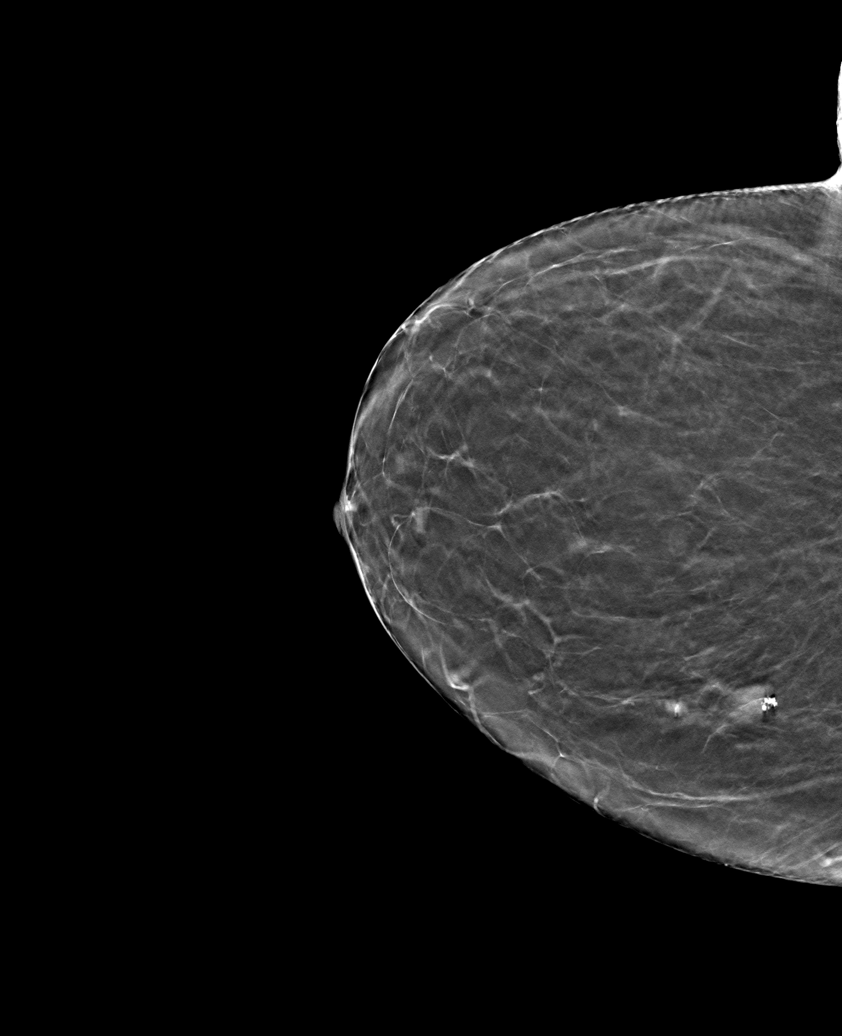

[6 of 30 positions shown; findings below may reference images not displayed]

ACR Breast Density Category b: There are scattered areas of
fibroglandular density.
FINDINGS: There are no findings suspicious for malignancy. Images were
processed with CAD.
IMPRESSION: No mammographic evidence of malignancy. A result letter of this
screening mammogram will be mailed directly to the patient.

RECOMMENDATION:
Screening mammogram in one year. (Code:[id number]-775)

BI-RADS CATEGORY  1: Negative.

## 2022-01-03 ENCOUNTER — Ambulatory Visit (INDEPENDENT_AMBULATORY_CARE_PROVIDER_SITE_OTHER): Payer: Medicare Other | Admitting: Physician Assistant

## 2022-01-03 ENCOUNTER — Encounter: Payer: Self-pay | Admitting: Physician Assistant

## 2022-01-03 DIAGNOSIS — I10 Essential (primary) hypertension: Secondary | ICD-10-CM | POA: Diagnosis not present

## 2022-01-03 NOTE — Assessment & Plan Note (Signed)
Discussed w/ pt overall goal would be 120s-30s/70s , hesitation from pt to increase lisinopril dose. Mutual decision to continue to check at home and return to office in 1 week. If still elevated would increase lisinopril 20 mg to 40 mg.

## 2022-01-03 NOTE — Progress Notes (Signed)
I,[name],acting as a Education administrator for [employer], PA-C.,have documented all relevant documentation on the behalf of Stacey Kirschner, PA-C,as directed by  Stacey Kirschner, PA-C while in the presence of Stacey Kirschner, PA-C.   Established patient visit   Patient: Stacey Fitzgerald   DOB: [birth year]   81 y.o. Female  MRN: [id number] Visit Date: 01/03/2022  Today's healthcare provider: [name], PA-C   Cc. Htn f/u  Subjective    HPI  Patient is in today for BP Check. BP taken on left arm with normal cuff with a reading of 170/87 and pulse of 86. Patient reports that BP was 127/75 around 8:00 am this morning. Gave the patient a few minutes to relax and rechecked BP on right arm with large cuff and reading was 152/72. She brings in readings from last week that are similar-- aside from this AM which was 127/75.  Hypertension, follow-up  BP Readings from Last 3 Encounters:  01/03/22 (!) 152/72  11/28/21 (!) 191/75  11/08/21 140/82   Wt Readings from Last 3 Encounters:  11/28/21 164 lb 11.2 oz (74.7 kg)  11/08/21 162 lb 11.2 oz (73.8 kg)  12/23/20 167 lb (75.8 kg)     She was last seen for hypertension 5 weeks ago.  BP at that visit was 191/75. Management since that visit includes switched quinapril 20 mg to lisinopril 20 mg.  She reports excellent compliance with treatment. She is not having side effects.  ---------------------------------------------------------------------------------------------------   Medications: Outpatient Medications Prior to Visit  Medication Sig   Acetaminophen (TYLENOL 8 HOUR ARTHRITIS PAIN PO) Take by mouth as needed.   diclofenac Sodium (VOLTAREN) 1 % GEL APPLY 2 GRAMS TO AFFECTED AREA 4 TIMES A DAY (Patient not taking: Reported on 11/28/2021)   lisinopril (ZESTRIL) 20 MG tablet Take 1 tablet (20 mg total) by mouth daily.   simvastatin (ZOCOR) 20 MG tablet Take 1 tablet (20 mg total) by mouth at bedtime.   No facility-administered  medications prior to visit.    Review of Systems  Constitutional:  Negative for fatigue and fever.  Respiratory:  Negative for cough and shortness of breath.   Cardiovascular:  Negative for chest pain and leg swelling.  Gastrointestinal:  Negative for abdominal pain.  Neurological:  Negative for dizziness and headaches.       Objective    BP (!) 152/72 (BP Location: Right Arm)   Pulse 86  Blood pressure (!) 152/72, pulse 86.   Physical Exam Vitals reviewed.  Constitutional:      Appearance: She is not ill-appearing.  HENT:     Head: Normocephalic.  Eyes:     Conjunctiva/sclera: Conjunctivae normal.  Cardiovascular:     Rate and Rhythm: Normal rate.  Pulmonary:     Effort: Pulmonary effort is normal. No respiratory distress.  Neurological:     General: No focal deficit present.     Mental Status: She is alert and oriented to person, place, and time.  Psychiatric:        Mood and Affect: Mood normal.        Behavior: Behavior normal.      No results found for any visits on 01/03/22.  Assessment & Plan     Problem List Items Addressed This Visit       Cardiovascular and Mediastinum   Essential hypertension    Discussed w/ pt overall goal would be 120s-30s/70s , hesitation from pt to increase lisinopril dose. Mutual decision to continue to check at  home and return to office in 1 week. If still elevated would increase lisinopril 20 mg to 40 mg.        Return in about 1 week (around 01/10/2022) for hypertension.      I, Stacey Kirschner, PA-C have reviewed all documentation for this visit. The documentation on  01/03/2022  for the exam, diagnosis, procedures, and orders are all accurate and complete.  Stacey Kirschner, PA-C [facility] [address] [place], [place], [address] Office: [phone] Fax: [place]

## 2022-01-03 NOTE — Progress Notes (Deleted)
   Acute Office Visit  Subjective:     Patient ID: Stacey Fitzgerald, female    DOB: 1941/02/16, 81 y.o.   MRN: [id number]  No chief complaint on file.   HPI Patient is in today for BP Check  ROS      Objective:    BP (!) 170/87  {Vitals History (Optional):23777}  Physical Exam  No results found for any visits on 01/03/22.      Assessment & Plan:   Problem List Items Addressed This Visit   None   No orders of the defined types were placed in this encounter.   No follow-ups on file.  Beverlee Nims, [place]

## 2022-01-09 NOTE — Progress Notes (Deleted)
I,Sha'taria Lorianna Spadaccini,acting as a Education administrator for [employer], PA-C.,have documented all relevant documentation on the behalf of [name], PA-C,as directed by  [name], PA-C while in the presence of [name], PA-C.  Established patient visit   Patient: Stacey Fitzgerald   DOB: 1940-11-18   81 y.o. Female  MRN: [id number] Visit Date: 01/10/2022  Today's healthcare provider: [name], PA-C   No chief complaint on file.  Subjective    HPI  Hypertension, follow-up  BP Readings from Last 3 Encounters:  01/03/22 (!) 152/72  11/28/21 (!) 191/75  11/08/21 140/82   Wt Readings from Last 3 Encounters:  11/28/21 164 lb 11.2 oz (74.7 kg)  11/08/21 162 lb 11.2 oz (73.8 kg)  12/23/20 167 lb (75.8 kg)     She was last seen for hypertension 1 weeks ago.  BP at that visit was 152/72. Management since that visit includes lisinopril 20 mg.  She reports {excellent/good/fair/poor:19665} compliance with treatment. She {is/is not:9024} having side effects. {document side effects if present:1} She is following a {diet:21022986} diet. She {is/is not:9024} exercising. She {does/does not:200015} smoke.  Use of agents associated with hypertension: {bp agents assoc with hypertension:511::"none"}.   Outside blood pressures are {***enter patient reported home BP readings, or 'not being checked':1}. Symptoms: {Yes/No:20286} chest pain {Yes/No:20286} chest pressure  {Yes/No:20286} palpitations {Yes/No:20286} syncope  {Yes/No:20286} dyspnea {Yes/No:20286} orthopnea  {Yes/No:20286} paroxysmal nocturnal dyspnea {Yes/No:20286} lower extremity edema   Pertinent labs Lab Results  Component Value Date   CHOL 177 11/22/2021   HDL 45 11/22/2021   LDLCALC 105 (H) 11/22/2021   TRIG 153 (H) 11/22/2021   CHOLHDL 3.9 11/22/2021   Lab Results  Component Value Date   NA 144 11/22/2021   K 4.3 11/22/2021   CREATININE 0.99 11/22/2021   EGFR 58 (L) 11/22/2021   GLUCOSE 91 11/22/2021    TSH 2.860 11/22/2021     The ASCVD Risk score (Arnett DK, et al., 2019) failed to calculate for the following reasons:   The 2019 ASCVD risk score is only valid for ages 67 to 107  ---------------------------------------------------------------------------------------------------   Medications: Outpatient Medications Prior to Visit  Medication Sig   Acetaminophen (TYLENOL 8 HOUR ARTHRITIS PAIN PO) Take by mouth as needed.   diclofenac Sodium (VOLTAREN) 1 % GEL APPLY 2 GRAMS TO AFFECTED AREA 4 TIMES A DAY (Patient not taking: Reported on 11/28/2021)   lisinopril (ZESTRIL) 20 MG tablet Take 1 tablet (20 mg total) by mouth daily.   simvastatin (ZOCOR) 20 MG tablet Take 1 tablet (20 mg total) by mouth at bedtime.   No facility-administered medications prior to visit.    Review of Systems  {Labs  Heme  Chem  Endocrine  Serology  Results Review (optional):23779}   Objective    There were no vitals taken for this visit. {Show previous vital signs (optional):23777}  Physical Exam  ***  No results found for any visits on 01/10/22.  Assessment & Plan     ***  No follow-ups on file.      {provider attestation***:1}   [name], PA-C  [facility] [phone] (phone) [phone] (fax)  [place]

## 2022-01-10 ENCOUNTER — Encounter: Payer: Self-pay | Admitting: Physician Assistant

## 2022-01-10 ENCOUNTER — Ambulatory Visit (INDEPENDENT_AMBULATORY_CARE_PROVIDER_SITE_OTHER): Payer: Medicare Other | Admitting: Physician Assistant

## 2022-01-10 DIAGNOSIS — I10 Essential (primary) hypertension: Secondary | ICD-10-CM | POA: Diagnosis not present

## 2022-01-10 NOTE — Assessment & Plan Note (Signed)
Due to consistently elevated pressures in office and home, increase lisinopril to 40 mg daily.  Advised caution w/ orthostatic hypotension, to call office w/ any episodes of dizziness  F/u 6 weeks

## 2022-01-10 NOTE — Progress Notes (Signed)
Acute Office Visit  Subjective:     Patient ID: Stacey Fitzgerald, female    DOB: Jun 03, 1941, 81 y.o.   MRN: [id number]  Cc. HTN f/u   HPI  Hypertension, follow-up  BP Readings from Last 3 Encounters:  01/03/22 (!) 152/72  11/28/21 (!) 191/75  11/08/21 140/82   Wt Readings from Last 3 Encounters:  11/28/21 164 lb 11.2 oz (74.7 kg)  11/08/21 162 lb 11.2 oz (73.8 kg)  12/23/20 167 lb (75.8 kg)     She was last seen for hypertension 1 weeks ago.  BP at that visit was 152/72. Management since that visit includes continue lisinopril 20 mg.  She reports excellent compliance with treatment. She is not having side effects.   Outside blood pressures are Fri 150/79, Sat 162/80, Sun 147/78, Mon 169/82, Tues 150/75, Wed 149/75 Symptoms: No chest pain No chest pressure  No palpitations No syncope  No dyspnea No orthopnea  No paroxysmal nocturnal dyspnea No lower extremity edema   Pertinent labs Lab Results  Component Value Date   CHOL 177 11/22/2021   HDL 45 11/22/2021   LDLCALC 105 (H) 11/22/2021   TRIG 153 (H) 11/22/2021   CHOLHDL 3.9 11/22/2021   Lab Results  Component Value Date   NA 144 11/22/2021   K 4.3 11/22/2021   CREATININE 0.99 11/22/2021   EGFR 58 (L) 11/22/2021   GLUCOSE 91 11/22/2021   TSH 2.860 11/22/2021     The ASCVD Risk score (Arnett DK, et al., 2019) failed to calculate for the following reasons:   The 2019 ASCVD risk score is only valid for ages 63 to 62  ---------------------------------------------------------------------------------------------------  Review of Systems  Constitutional:  Negative for malaise/fatigue.  Respiratory:  Negative for cough and shortness of breath.   Cardiovascular:  Negative for chest pain, palpitations and leg swelling.  Neurological:  Negative for headaches.  Psychiatric/Behavioral:  The patient is nervous/anxious.         Objective:   Blood pressure (!) 179/59, pulse (!) 57, weight 162 lb 12.8 oz (73.8  kg), SpO2 100 %.   Physical Exam Vitals reviewed.  Constitutional:      Appearance: She is not ill-appearing.  HENT:     Head: Normocephalic.  Eyes:     Conjunctiva/sclera: Conjunctivae normal.  Cardiovascular:     Rate and Rhythm: Normal rate.  Pulmonary:     Effort: Pulmonary effort is normal. No respiratory distress.  Neurological:     General: No focal deficit present.     Mental Status: She is alert and oriented to person, place, and time.  Psychiatric:        Mood and Affect: Mood normal.        Behavior: Behavior normal.    No results found for any visits on 01/10/22.      Assessment & Plan:   Problem List Items Addressed This Visit       Cardiovascular and Mediastinum   Essential hypertension    Due to consistently elevated pressures in office and home, increase lisinopril to 40 mg daily.  Advised caution w/ orthostatic hypotension, to call office w/ any episodes of dizziness  F/u 6 weeks       I, [name], PA-C have reviewed all documentation for this visit. The documentation on  01/10/2022  for the exam, diagnosis, procedures, and orders are all accurate and complete.  [name], PA-C [facility] [address] [place], [place], [address] Office: [phone] Fax: [phone]

## 2022-02-21 NOTE — Progress Notes (Unsigned)
      Established patient visit   Patient: Stacey Fitzgerald   DOB: 12/23/1940   81 y.o. Female  MRN: [id number] Visit Date: 02/22/2022  Today's healthcare provider:  , PA-C   No chief complaint on file.  Subjective    HPI  Hypertension, follow-up  BP Readings from Last 3 Encounters:  01/10/22 (!) 179/59  01/03/22 (!) 152/72  11/28/21 (!) 191/75   Wt Readings from Last 3 Encounters:  01/10/22 162 lb 12.8 oz (73.8 kg)  11/28/21 164 lb 11.2 oz (74.7 kg)  11/08/21 162 lb 11.2 oz (73.8 kg)     She was last seen for hypertension 6 weeks ago.  BP at that visit was 179/59. Management since that visit includes increase lisinopril to 40 mg daily.  She reports {excellent/good/fair/poor:19665} compliance with treatment. She {is/is not:9024} having side effects. {document side effects if present:1} She is following a {diet:21022986} diet. She {is/is not:9024} exercising. She {does/does not:200015} smoke.  Use of agents associated with hypertension: {bp agents assoc with hypertension:511::"none"}.   Outside blood pressures are {***enter patient reported home BP readings, or 'not being checked':1}. Symptoms: {Yes/No:20286} chest pain {Yes/No:20286} chest pressure  {Yes/No:20286} palpitations {Yes/No:20286} syncope  {Yes/No:20286} dyspnea {Yes/No:20286} orthopnea  {Yes/No:20286} paroxysmal nocturnal dyspnea {Yes/No:20286} lower extremity edema   Pertinent labs Lab Results  Component Value Date   CHOL 177 11/22/2021   HDL 45 11/22/2021   LDLCALC 105 (H) 11/22/2021   TRIG 153 (H) 11/22/2021   CHOLHDL 3.9 11/22/2021   Lab Results  Component Value Date   NA 144 11/22/2021   K 4.3 11/22/2021   CREATININE 0.99 11/22/2021   EGFR 58 (L) 11/22/2021   GLUCOSE 91 11/22/2021   TSH 2.860 11/22/2021     The ASCVD Risk score (Arnett DK, et al., 2019) failed to calculate for the following reasons:   The 2019 ASCVD risk score is only valid for ages 40 to  79  ---------------------------------------------------------------------------------------------------   Medications: Outpatient Medications Prior to Visit  Medication Sig   Acetaminophen (TYLENOL 8 HOUR ARTHRITIS PAIN PO) Take by mouth as needed.   diclofenac Sodium (VOLTAREN) 1 % GEL    lisinopril (ZESTRIL) 20 MG tablet Take 1 tablet (20 mg total) by mouth daily.   simvastatin (ZOCOR) 20 MG tablet Take 1 tablet (20 mg total) by mouth at bedtime.   No facility-administered medications prior to visit.    Review of Systems  {Labs  Heme  Chem  Endocrine  Serology  Results Review (optional):23779}   Objective    There were no vitals taken for this visit. {Show previous vital signs (optional):23777}  Physical Exam  ***  No results found for any visits on 02/22/22.  Assessment & Plan     ***  No follow-ups on file.      {provider attestation***:1}    , PA-C  [facility] [phone] (phone) [phone] (fax)  [facility]  

## 2022-02-22 ENCOUNTER — Ambulatory Visit (INDEPENDENT_AMBULATORY_CARE_PROVIDER_SITE_OTHER): Payer: Medicare Other | Admitting: Physician Assistant

## 2022-02-22 ENCOUNTER — Encounter: Payer: Self-pay | Admitting: Physician Assistant

## 2022-02-22 DIAGNOSIS — I10 Essential (primary) hypertension: Secondary | ICD-10-CM

## 2022-02-22 MED ORDER — LISINOPRIL 20 MG PO TABS: 40.0000 mg | ORAL_TABLET | Freq: Every day | ORAL | 1 refills | Status: DC

## 2022-02-22 NOTE — Assessment & Plan Note (Signed)
Improved at home, still higher than ideal on average, but for now will follow and in 3 mo can consider adding amlodipine.  Pt has significant stress at home w/ husbands care and cancer dx Will hold off on adding amlodipine for now, comfortable w/ home ranges.  F/u 3 mo

## 2022-03-07 DIAGNOSIS — M13862 Other specified arthritis, left knee: Secondary | ICD-10-CM | POA: Diagnosis not present

## 2022-04-18 ENCOUNTER — Ambulatory Visit: Payer: Medicare Other | Admitting: Podiatry

## 2022-04-18 DIAGNOSIS — D2372 Other benign neoplasm of skin of left lower limb, including hip: Secondary | ICD-10-CM | POA: Diagnosis not present

## 2022-04-18 NOTE — Progress Notes (Signed)
She presents today for chief complaint of a painful corn on the plantar aspect of her left foot as she points to the fifth metatarsal head.  Objective: Vital signs are stable alert and oriented x3.  There is no erythema edema cellulitis drainage or odor though she does have a boggy fluctuance beneath the fifth metatarsal head consistent with a bursitis.  Overlying this area is a small benign skin lesion consistent with a porokeratosis.  Assessment: Porokeratosis as well as bursitis of fifth metatarsal head of the left foot.  Plan: Injected 2 mg of dexamethasone to the point of maximal tenderness injecting it into the bursa today.  I also debrided her reactive hyperkeratotic lesion and placed a Salinocaine patch under occlusion to be left on for 2 to 3 days and then thoroughly washed off.  I will follow-up with her on an as-needed basis.

## 2022-05-24 NOTE — Progress Notes (Signed)
I,[name],acting as a Education administrator for [employer], PA-C.,have documented all relevant documentation on the behalf of Stacey Kirschner, PA-C,as directed by  Stacey Kirschner, PA-C while in the presence of Stacey Kirschner, PA-C.   Established patient visit   Patient: Stacey Fitzgerald   DOB: 09-10-1940   81 y.o. Female  MRN: [id number] Visit Date: 05/25/2022  Today's healthcare provider: [name], PA-C   Cc. Htn f/u  Subjective    HPI  Hypertension, follow-up  BP Readings from Last 3 Encounters:  05/25/22 (!) 174/73  02/22/22 132/75  01/10/22 (!) 179/59   Wt Readings from Last 3 Encounters:  05/25/22 158 lb 6.4 oz (71.8 kg)  02/22/22 162 lb 12.8 oz (73.8 kg)  01/10/22 162 lb 12.8 oz (73.8 kg)     She was last seen for hypertension 3 months ago.  BP at that visit was 132/75. Management since that visit includes continue lisinopril.  She reports excellent compliance with treatment. She is not having side effects. She is following a Regular diet. She is exercising. She does not smoke.  Use of agents associated with hypertension: none.   Outside blood pressures are checked 1-2xs weekly--not since her husband passed Symptoms: No chest pain No chest pressure  No palpitations No syncope  No dyspnea No orthopnea  No paroxysmal nocturnal dyspnea No lower extremity edema   Pertinent labs Lab Results  Component Value Date   CHOL 177 11/22/2021   HDL 45 11/22/2021   LDLCALC 105 (H) 11/22/2021   TRIG 153 (H) 11/22/2021   CHOLHDL 3.9 11/22/2021   Lab Results  Component Value Date   NA 144 11/22/2021   K 4.3 11/22/2021   CREATININE 0.99 11/22/2021   EGFR 58 (L) 11/22/2021   GLUCOSE 91 11/22/2021   TSH 2.860 11/22/2021     The ASCVD Risk score (Arnett DK, et al., 2019) failed to calculate for the following reasons:   The 2019 ASCVD risk score is only valid for ages 51 to  21   ---------------------------------------------------------------------------------------------------   Medications: Outpatient Medications Prior to Visit  Medication Sig   Acetaminophen (TYLENOL 8 HOUR ARTHRITIS PAIN PO) Take by mouth as needed.   diclofenac Sodium (VOLTAREN) 1 % GEL    simvastatin (ZOCOR) 20 MG tablet Take 1 tablet (20 mg total) by mouth at bedtime.   [DISCONTINUED] lisinopril (ZESTRIL) 20 MG tablet Take 2 tablets (40 mg total) by mouth daily.   No facility-administered medications prior to visit.      Objective    Blood pressure (!) 174/73, pulse 71, weight 158 lb 6.4 oz (71.8 kg), SpO2 100 %.   Physical Exam Vitals reviewed.  Constitutional:      Appearance: She is not ill-appearing.  HENT:     Head: Normocephalic.  Eyes:     Conjunctiva/sclera: Conjunctivae normal.  Cardiovascular:     Rate and Rhythm: Normal rate.  Pulmonary:     Effort: Pulmonary effort is normal. No respiratory distress.  Neurological:     General: No focal deficit present.     Mental Status: She is alert and oriented to person, place, and time.  Psychiatric:        Mood and Affect: Mood normal.        Behavior: Behavior normal.     No results found for any visits on 05/25/22.  Assessment & Plan     Problem List Items Addressed This Visit       Cardiovascular and Mediastinum   Essential hypertension  Pt was agitated today, discussing her husbands passing and recent family difficulty.  Currently manages with lisinopril 20 mg BID as this works best for her Review of recent gyn appt, bp was [facility]  Encouraged her to restart taking at home F/u with awv      Relevant Medications   lisinopril (ZESTRIL) 20 MG tablet     Return in about 5 months (around 10/24/2022) for AVW.      I, Stacey Kirschner, PA-C have reviewed all documentation for this visit. The documentation on  05/25/2022 for the exam, diagnosis, procedures, and orders are all accurate and complete.  Stacey Kirschner, PA-C [facility] [address] [place], [place], [address] Office: [phone] Fax: [place]

## 2022-05-25 ENCOUNTER — Encounter: Payer: Self-pay | Admitting: Physician Assistant

## 2022-05-25 ENCOUNTER — Other Ambulatory Visit: Payer: Self-pay | Admitting: Physician Assistant

## 2022-05-25 ENCOUNTER — Ambulatory Visit (INDEPENDENT_AMBULATORY_CARE_PROVIDER_SITE_OTHER): Payer: Medicare Other | Admitting: Physician Assistant

## 2022-05-25 DIAGNOSIS — I10 Essential (primary) hypertension: Secondary | ICD-10-CM

## 2022-05-25 MED ORDER — LISINOPRIL 20 MG PO TABS: 20.0000 mg | ORAL_TABLET | Freq: Two times a day (BID) | ORAL | 1 refills | Status: DC

## 2022-05-25 NOTE — Assessment & Plan Note (Addendum)
Pt was agitated today, discussing her husbands passing and recent family difficulty.  Currently manages with lisinopril 20 mg BID as this works best for her Review of recent gyn appt, bp was [facility]  Encouraged her to restart taking at home F/u with awv

## 2022-06-04 ENCOUNTER — Other Ambulatory Visit: Payer: Self-pay | Admitting: Physician Assistant

## 2022-06-04 DIAGNOSIS — I10 Essential (primary) hypertension: Secondary | ICD-10-CM

## 2022-06-04 NOTE — Telephone Encounter (Unsigned)
Copied from [place] [id number]. Topic: General - Other >> Jun 04, 2022 10:00 AM Everette C wrote: Reason for CRM: Medication Refill - Medication: lisinopril (ZESTRIL) 20 MG tablet [[id number]]  Has the patient contacted their pharmacy? No. (Agent: If no, request that the patient contact the pharmacy for the refill. If patient does not wish to contact the pharmacy document the reason why and proceed with request.) (Agent: If yes, when and what did the pharmacy advise?)  Preferred Pharmacy (with phone number or street name): [id number] [address]Phone: [phone]: [phone]: Not open 24 hours   Has the patient been seen for an appointment in the last year OR does the patient have an upcoming appointment? Yes.    Agent: Please be advised that RX refills may take up to 3 business days. We ask that you follow-up with your pharmacy.

## 2022-06-05 MED ORDER — LISINOPRIL 20 MG PO TABS: 20.0000 mg | ORAL_TABLET | Freq: Two times a day (BID) | ORAL | 1 refills | Status: DC

## 2022-06-05 NOTE — Telephone Encounter (Signed)
Requested Prescriptions  Pending Prescriptions Disp Refills   lisinopril (ZESTRIL) 20 MG tablet 180 tablet 1    Sig: Take 1 tablet (20 mg total) by mouth 2 (two) times daily.     Cardiovascular:  ACE Inhibitors Failed - 06/04/2022 10:17 AM      Failed - Cr in normal range and within 180 days    Creat  Date Value Ref Range Status  05/31/2017 0.87 0.60 - 0.93 mg/dL Final    Comment:    For patients >80 years of age, the reference limit for Creatinine is approximately 13% higher for people identified as African-American. .    Creatinine, Ser  Date Value Ref Range Status  11/22/2021 0.99 0.57 - 1.00 mg/dL Final         Failed - K in normal range and within 180 days    Potassium  Date Value Ref Range Status  11/22/2021 4.3 3.5 - 5.2 mmol/L Final         Failed - Last BP in normal range    BP Readings from Last 1 Encounters:  05/25/22 (!) 174/73         Passed - Patient is not pregnant      Passed - Valid encounter within last 6 months    Recent Outpatient Visits           1 week ago Essential hypertension   [facility], [place], PA-C   3 months ago Essential hypertension   [facility], [place], PA-C   4 months ago Essential hypertension   [facility], [place], PA-C   5 months ago Essential hypertension   [facility], [place], PA-C   6 months ago Hypothyroidism, unspecified type   [facility] Thedore Mins, [place], [place]

## 2022-06-27 ENCOUNTER — Telehealth: Payer: Self-pay | Admitting: Physician Assistant

## 2022-06-27 NOTE — Telephone Encounter (Signed)
Patient needs a letter composed so she can have her mailbox moved closer to her house.  I am sending the "form" letter back on paper for you to see.   Please compose the letter and place in her "mychart"  so they can print it from there.

## 2022-06-28 ENCOUNTER — Encounter: Payer: Self-pay | Admitting: Physician Assistant

## 2022-06-28 NOTE — Telephone Encounter (Signed)
Patient advised. Verbalized understanding 

## 2022-10-24 ENCOUNTER — Ambulatory Visit (INDEPENDENT_AMBULATORY_CARE_PROVIDER_SITE_OTHER): Payer: Medicare Other | Admitting: Physician Assistant

## 2022-10-24 ENCOUNTER — Encounter: Payer: Self-pay | Admitting: Physician Assistant

## 2022-10-24 VITALS — BP 167/77 | HR 58 | Wt 160.0 lb

## 2022-10-24 DIAGNOSIS — E039 Hypothyroidism, unspecified: Secondary | ICD-10-CM

## 2022-10-24 DIAGNOSIS — R413 Other amnesia: Secondary | ICD-10-CM | POA: Diagnosis not present

## 2022-10-24 DIAGNOSIS — Z0000 Encounter for general adult medical examination without abnormal findings: Secondary | ICD-10-CM | POA: Diagnosis not present

## 2022-10-24 DIAGNOSIS — E7800 Pure hypercholesterolemia, unspecified: Secondary | ICD-10-CM

## 2022-10-24 DIAGNOSIS — I10 Essential (primary) hypertension: Secondary | ICD-10-CM | POA: Diagnosis not present

## 2022-10-24 NOTE — Progress Notes (Signed)
I,[name],acting as a Neurosurgeon for [employer], PA-C.,have documented all relevant documentation on the behalf of Alfredia Ferguson, PA-C,as directed by  Alfredia Ferguson, PA-C while in the presence of Alfredia Ferguson, PA-C.   Complete physical exam   Patient: Stacey Fitzgerald   DOB: 04-26-1941   82 y.o. Female  MRN: [id number] Visit Date: 10/24/2022  Today's healthcare provider: Alfredia Ferguson, PA-C   Cc. cpe  Subjective    Stacey Fitzgerald is a 82 y.o. female who presents today for a complete physical exam.  She reports consuming a general diet.  The patient reports daily houses duties as ger form of exercise as her washer dryer and refrigerator are downstairs in the basement.  She generally feels well. She reports sleeping well. She does not have additional problems to discuss today.    Past Medical History:  Diagnosis Date   Arthritis    Female cystocele    Hyperlipidemia    Hypertension    Hypothyroidism    Menopausal state    Obesity, unspecified    Pessary maintenance    Rectocele    Past Surgical History:  Procedure Laterality Date   BREAST EXCISIONAL BIOPSY Left 1972   benign   BREAST SURGERY     benign cyst   COLONOSCOPY     COLONOSCOPY WITH PROPOFOL N/A 11/23/2019   Procedure: COLONOSCOPY WITH PROPOFOL;  Surgeon: Toledo, Boykin Nearing, MD;  Location: [facility] ENDOSCOPY;  Service: Gastroenterology;  Laterality: N/A;   miscarrage     TUBAL LIGATION     uterine pessary     Social History   Socioeconomic History   Marital status: Married    Spouse name: Not on file   Number of children: 2   Years of education: Not on file   Highest education level: 12th grade  Occupational History   Occupation: retired  Tobacco Use   Smoking status: Never   Smokeless tobacco: Never  Vaping Use   Vaping Use: Never used  Substance and Sexual Activity   Alcohol use: Not Currently    Alcohol/week: 0.0 standard drinks of alcohol   Drug use: No   Sexual activity: Not on  file  Other Topics Concern   Not on file  Social History Narrative   Not on file   Social Determinants of Health   Financial Resource Strain: Low Risk  (11/08/2021)   Overall Financial Resource Strain (CARDIA)    Difficulty of Paying Living Expenses: Not hard at all  Food Insecurity: No Food Insecurity (11/08/2021)   Hunger Vital Sign    Worried About Running Out of Food in the Last Year: Never true    Ran Out of Food in the Last Year: Never true  Transportation Needs: No Transportation Needs (11/08/2021)   PRAPARE - Administrator, Civil Service (Medical): No    Lack of Transportation (Non-Medical): No  Physical Activity: Inactive (09/16/2019)   Exercise Vital Sign    Days of Exercise per Week: 0 days    Minutes of Exercise per Session: 0 min  Stress: No Stress Concern Present (11/08/2021)   [employer] of Occupational Health - Occupational Stress Questionnaire    Feeling of Stress : Not at all  Social Connections: Moderately Integrated (11/08/2021)   Social Connection and Isolation Panel [NHANES]    Frequency of Communication with Friends and Family: More than three times a week    Frequency of Social Gatherings with Friends and Family: Once a week  Attends Religious Services: More than 4 times per year    Active Member of Clubs or Organizations: No    Attends Banker Meetings: Never    Marital Status: Married  Catering manager Violence: Not At Risk (11/08/2021)   Humiliation, Afraid, Rape, and Kick questionnaire    Fear of Current or Ex-Partner: No    Emotionally Abused: No    Physically Abused: No    Sexually Abused: No   Family Status  Relation Name Status   Mother  Deceased at age 26   Father  Deceased at age 83   Sister 1 Deceased at age 51   Brother  Deceased   MGF  Deceased   PGF  Deceased   Sister 2 Deceased   Neg Hx  (Not Specified)   Family History  Problem Relation Age of Onset   Diabetes Mother    Seizures Mother    CVA  Father    Dementia Brother    Diabetes Maternal Grandfather    Mental illness Sister    Breast cancer Neg Hx    Allergies  Allergen Reactions   Cephalexin Hives    Patient Care Team: Alfredia Ferguson, PA-C as PCP - General (Physician Assistant) Ward, Elenora Fender, MD as Referring Physician (Obstetrics and Gynecology)   Medications: Outpatient Medications Prior to Visit  Medication Sig   Acetaminophen (TYLENOL 8 HOUR ARTHRITIS PAIN PO) Take by mouth as needed.   diclofenac Sodium (VOLTAREN) 1 % GEL    lisinopril (ZESTRIL) 20 MG tablet Take 1 tablet (20 mg total) by mouth 2 (two) times daily.   simvastatin (ZOCOR) 20 MG tablet Take 1 tablet (20 mg total) by mouth at bedtime.   No facility-administered medications prior to visit.    Review of Systems  Constitutional:  Negative for fatigue and fever.  Respiratory:  Negative for cough and shortness of breath.   Cardiovascular:  Negative for chest pain and leg swelling.  Gastrointestinal:  Negative for abdominal pain.  Neurological:  Negative for dizziness and headaches.     Objective    BP (!) 167/77 (BP Location: Left Arm, Patient Position: Sitting, Cuff Size: Normal)   Pulse (!) 58   Wt 160 lb (72.6 kg)   SpO2 100%   BMI 22.96 kg/m   Physical Exam Constitutional:      General: She is awake.     Appearance: She is well-developed. She is not ill-appearing.  HENT:     Head: Normocephalic.     Right Ear: Tympanic membrane normal.     Left Ear: Tympanic membrane normal.     Nose: Nose normal. No congestion or rhinorrhea.     Mouth/Throat:     Pharynx: No oropharyngeal exudate or posterior oropharyngeal erythema.  Eyes:     Conjunctiva/sclera: Conjunctivae normal.     Pupils: Pupils are equal, round, and reactive to light.  Neck:     Thyroid: No thyroid mass or thyromegaly.  Cardiovascular:     Rate and Rhythm: Normal rate and regular rhythm.     Heart sounds: Normal heart sounds.  Pulmonary:     Effort: Pulmonary  effort is normal.     Breath sounds: Normal breath sounds.  Abdominal:     Palpations: Abdomen is soft.     Tenderness: There is no abdominal tenderness.  Musculoskeletal:     Right lower leg: No swelling. No edema.     Left lower leg: No swelling. No edema.  Lymphadenopathy:     Cervical:  No cervical adenopathy.  Skin:    General: Skin is warm.  Neurological:     Mental Status: She is alert and oriented to person, place, and time.  Psychiatric:        Attention and Perception: Attention normal.        Mood and Affect: Mood normal.        Speech: Speech normal.        Behavior: Behavior normal. Behavior is cooperative.      Last depression screening scores    10/24/2022    9:43 AM 05/25/2022    8:56 AM 11/08/2021   10:56 AM  PHQ 2/9 Scores  PHQ - 2 Score 0 0 0  PHQ- 9 Score 0 0    Last fall risk screening    10/24/2022    9:42 AM  Fall Risk   Falls in the past year? 0  Number falls in past yr: 0  Injury with Fall? 0  Risk for fall due to : No Fall Risks  Follow up Falls evaluation completed   Last Audit-C alcohol use screening    10/24/2022    9:42 AM  Alcohol Use Disorder Test (AUDIT)  1. How often do you have a drink containing alcohol? 0  2. How many drinks containing alcohol do you have on a typical day when you are drinking? 0  3. How often do you have six or more drinks on one occasion? 0  AUDIT-C Score 0   A score of 3 or more in women, and 4 or more in men indicates increased risk for alcohol abuse, EXCEPT if all of the points are from question 1      10/24/2022   10:01 AM  MMSE - Mini Mental State Exam  Orientation to time 2  Orientation to Place 3  Registration 2  Attention/ Calculation 1  Recall 1  Language- name 2 objects 2  Language- repeat 0  Language- follow 3 step command 2  Language- read & follow direction 1  Write a sentence 1  Copy design 1  Total score 16   Results for orders placed or performed in visit on 10/24/22  CBC  w/Diff/Platelet  Result Value Ref Range   WBC 9.7 3.4 - 10.8 x10E3/uL   RBC 4.18 3.77 - 5.28 x10E6/uL   Hemoglobin 11.4 11.1 - 15.9 g/dL   Hematocrit 16.1 09.6 - 46.6 %   MCV 86 79 - 97 fL   MCH 27.3 26.6 - 33.0 pg   MCHC 31.8 31.5 - 35.7 g/dL   RDW 04.5 40.9 - 81.1 %   Platelets 414 150 - 450 x10E3/uL   Neutrophils 69 Not Estab. %   Lymphs 21 Not Estab. %   Monocytes 7 Not Estab. %   Eos 2 Not Estab. %   Basos 1 Not Estab. %   Neutrophils Absolute 6.8 1.4 - 7.0 x10E3/uL   Lymphocytes Absolute 2.0 0.7 - 3.1 x10E3/uL   Monocytes Absolute 0.6 0.1 - 0.9 x10E3/uL   EOS (ABSOLUTE) 0.2 0.0 - 0.4 x10E3/uL   Basophils Absolute 0.1 0.0 - 0.2 x10E3/uL   Immature Granulocytes 0 Not Estab. %   Immature Grans (Abs) 0.0 0.0 - 0.1 x10E3/uL  Lipid panel  Result Value Ref Range   Cholesterol, Total 181 100 - 199 mg/dL   Triglycerides 914 (H) 0 - 149 mg/dL   HDL 54 >78 mg/dL   VLDL Cholesterol Cal 29 5 - 40 mg/dL   LDL Chol Calc (NIH) 98 0 -  99 mg/dL   Chol/HDL Ratio 3.4 0.0 - 4.4 ratio  Comprehensive Metabolic Panel (CMET)  Result Value Ref Range   Glucose 99 70 - 99 mg/dL   BUN 20 8 - 27 mg/dL   Creatinine, Ser 9.56 (H) 0.57 - 1.00 mg/dL   eGFR 53 (L) >21 HY/QMV/7.84   BUN/Creatinine Ratio 19 12 - 28   Sodium 141 134 - 144 mmol/L   Potassium 4.8 3.5 - 5.2 mmol/L   Chloride 103 96 - 106 mmol/L   CO2 24 20 - 29 mmol/L   Calcium 10.0 8.7 - 10.3 mg/dL   Total Protein 6.8 6.0 - 8.5 g/dL   Albumin 4.3 3.7 - 4.7 g/dL   Globulin, Total 2.5 1.5 - 4.5 g/dL   Albumin/Globulin Ratio 1.7 1.2 - 2.2   Bilirubin Total 0.3 0.0 - 1.2 mg/dL   Alkaline Phosphatase 78 44 - 121 IU/L   AST 9 0 - 40 IU/L   ALT 8 0 - 32 IU/L  TSH + free T4  Result Value Ref Range   TSH 2.760 0.450 - 4.500 uIU/mL   Free T4 1.12 0.82 - 1.77 ng/dL    Assessment & Plan    Routine Health Maintenance and Physical Exam  Exercise Activities and Dietary recommendations --balanced diet high in fiber and protein, low in  sugars, carbs, fats. --physical activity/exercise 30 minutes 3-5 times a week    Immunization History  Administered Date(s) Administered   Moderna Sars-Covid-2 Vaccination 08/04/2019, 09/01/2019    Health Maintenance  Topic Date Due   DTaP/Tdap/Td (1 - Tdap) Never done   Zoster Vaccines- Shingrix (1 of 2) Never done   COVID-19 Vaccine (3 - Moderna risk series) 09/29/2019   DEXA SCAN  02/14/2021   Medicare Annual Wellness (AWV)  11/09/2022   Pneumonia Vaccine 11+ Years old (1 of 1 - PCV) 11/29/2022 (Originally 10/11/2006)   INFLUENZA VACCINE  02/21/2023   COLONOSCOPY (Pts 45-98yrs Insurance coverage will need to be confirmed)  11/22/2024   HPV VACCINES  Aged Out    Discussed health benefits of physical activity, and encouraged her to engage in regular exercise appropriate for her age and condition.  Problem List Items Addressed This Visit       Cardiovascular and Mediastinum   Essential hypertension    Manages with lisinopril 20 mg BID Elevated in office. She does not monitor consistently at home Concerns over memory and if she is taking her meds. Advised she record and bring in values, f/u 4 weeks with her son present      Relevant Orders   CBC w/Diff/Platelet (Completed)   Comprehensive Metabolic Panel (CMET) (Completed)     Endocrine   Hypothyroidism   Relevant Orders   TSH + free T4 (Completed)     Other   Pure hypercholesterolemia    Will repeat fasting lipids Managed with simvastatin 20 mg. LDL goal is < 100 mg       Relevant Orders   Lipid panel (Completed)   Memory changes    Staying on topic today was more difficult than usual Pt repeating certain stories/phrases Mini mental today 16/30 . Discussed with patient, met with some resistance. Advised she f/u in 4 weeks with her son present. I would like to further discuss goals of care given she lives alone      Other Visit Diagnoses     Annual physical exam    -  Primary        Return in about 4  weeks (around 11/21/2022) for hypertension.     I, Alfredia Ferguson, PA-C have reviewed all documentation for this visit. The documentation on  10/24/22 for the exam, diagnosis, procedures, and orders are all accurate and complete.  Alfredia Ferguson, PA-C [facility] [address] [place], [place], [address] Office: [phone] Fax: [phone]   [facility]

## 2022-10-25 LAB — COMPREHENSIVE METABOLIC PANEL
ALT: 8 IU/L (ref 0–32)
AST: 9 IU/L (ref 0–40)
Albumin/Globulin Ratio: 1.7 (ref 1.2–2.2)
Albumin: 4.3 g/dL (ref 3.7–4.7)
Alkaline Phosphatase: 78 IU/L (ref 44–121)
BUN/Creatinine Ratio: 19 (ref 12–28)
BUN: 20 mg/dL (ref 8–27)
Bilirubin Total: 0.3 mg/dL (ref 0.0–1.2)
CO2: 24 mmol/L (ref 20–29)
Calcium: 10 mg/dL (ref 8.7–10.3)
Chloride: 103 mmol/L (ref 96–106)
Creatinine, Ser: 1.05 mg/dL — ABNORMAL HIGH (ref 0.57–1.00)
Globulin, Total: 2.5 g/dL (ref 1.5–4.5)
Glucose: 99 mg/dL (ref 70–99)
Potassium: 4.8 mmol/L (ref 3.5–5.2)
Sodium: 141 mmol/L (ref 134–144)
Total Protein: 6.8 g/dL (ref 6.0–8.5)
eGFR: 53 mL/min/{1.73_m2} — ABNORMAL LOW (ref >59)

## 2022-10-25 LAB — CBC WITH DIFFERENTIAL/PLATELET
Basophils Absolute: 0.1 10*3/uL (ref 0.0–0.2)
Basos: 1 %
EOS (ABSOLUTE): 0.2 10*3/uL (ref 0.0–0.4)
Eos: 2 %
Hematocrit: 35.8 % (ref 34.0–46.6)
Hemoglobin: 11.4 g/dL (ref 11.1–15.9)
Immature Grans (Abs): 0 10*3/uL (ref 0.0–0.1)
Immature Granulocytes: 0 %
Lymphocytes Absolute: 2 10*3/uL (ref 0.7–3.1)
Lymphs: 21 %
MCH: 27.3 pg (ref 26.6–33.0)
MCHC: 31.8 g/dL (ref 31.5–35.7)
MCV: 86 fL (ref 79–97)
Monocytes Absolute: 0.6 10*3/uL (ref 0.1–0.9)
Monocytes: 7 %
Neutrophils Absolute: 6.8 10*3/uL (ref 1.4–7.0)
Neutrophils: 69 %
Platelets: 414 10*3/uL (ref 150–450)
RBC: 4.18 x10E6/uL (ref 3.77–5.28)
RDW: 12.6 % (ref 11.7–15.4)
WBC: 9.7 10*3/uL (ref 3.4–10.8)

## 2022-10-25 LAB — LIPID PANEL
Chol/HDL Ratio: 3.4 ratio (ref 0.0–4.4)
Cholesterol, Total: 181 mg/dL (ref 100–199)
HDL: 54 mg/dL (ref >39)
LDL Chol Calc (NIH): 98 mg/dL (ref 0–99)
Triglycerides: 166 mg/dL — ABNORMAL HIGH (ref 0–149)
VLDL Cholesterol Cal: 29 mg/dL (ref 5–40)

## 2022-10-25 LAB — TSH+FREE T4
Free T4: 1.12 ng/dL (ref 0.82–1.77)
TSH: 2.76 u[IU]/mL (ref 0.450–4.500)

## 2022-10-26 ENCOUNTER — Encounter: Payer: Self-pay | Admitting: Physician Assistant

## 2022-10-26 DIAGNOSIS — R413 Other amnesia: Secondary | ICD-10-CM | POA: Insufficient documentation

## 2022-10-26 NOTE — Assessment & Plan Note (Signed)
Manages with lisinopril 20 mg BID Elevated in office. She does not monitor consistently at home Concerns over memory and if she is taking her meds. Advised she record and bring in values, f/u 4 weeks with her son present

## 2022-10-26 NOTE — Assessment & Plan Note (Signed)
Will repeat fasting lipids Managed with simvastatin 20 mg. LDL goal is < 100 mg

## 2022-10-26 NOTE — Assessment & Plan Note (Signed)
Staying on topic today was more difficult than usual Pt repeating certain stories/phrases Mini mental today 16/30 . Discussed with patient, met with some resistance. Advised she f/u in 4 weeks with her son present. I would like to further discuss goals of care given she lives alone

## 2022-10-31 ENCOUNTER — Ambulatory Visit: Payer: Medicare Other | Admitting: Podiatry

## 2022-10-31 ENCOUNTER — Encounter: Payer: Self-pay | Admitting: Podiatry

## 2022-10-31 DIAGNOSIS — D2372 Other benign neoplasm of skin of left lower limb, including hip: Secondary | ICD-10-CM

## 2022-10-31 DIAGNOSIS — M7752 Other enthesopathy of left foot: Secondary | ICD-10-CM

## 2022-10-31 MED ORDER — DEXAMETHASONE SODIUM PHOSPHATE 120 MG/30ML IJ SOLN
2.0000 mg | Freq: Once | INTRAMUSCULAR | Status: AC
Start: 2022-10-31 — End: 2022-10-31
  Administered 2022-10-31: 2 mg via INTRA_ARTICULAR

## 2022-10-31 NOTE — Progress Notes (Signed)
She presents today with a chief complaint of a painful callus beneath the fifth metatarsal of the left foot.  States that she thinks she needs the injection as well.  She states that she has been working on it but if it only bothers her once a year she is very happy with that.  Objective: Vital signs are stable she is alert and oriented x 3 she has a palpable bursa beneath the fifth metatarsal head of the left foot with fluctuance.  There is no purulence no drainage.  Benign skin lesions are noted in the same vicinity.  Assessment: Pain in limb secondary to benign skin lesion pain in limb secondary to bursitis.  Plan: Discussed etiology pathology and surgical therapies at this point I injected 2 mg dexamethasone local anesthetic beneath the fifth metatarsal head of the left foot I also debrided the benign skin lesions we will follow-up with her on an as-needed basis.

## 2022-11-20 ENCOUNTER — Ambulatory Visit (INDEPENDENT_AMBULATORY_CARE_PROVIDER_SITE_OTHER): Payer: Medicare Other

## 2022-11-20 VITALS — Ht 70.0 in | Wt 160.0 lb

## 2022-11-20 DIAGNOSIS — Z0000 Encounter for general adult medical examination without abnormal findings: Secondary | ICD-10-CM

## 2022-11-20 NOTE — Patient Instructions (Addendum)
Stacey Fitzgerald , Thank you for taking time to come for your Medicare Wellness Visit. I appreciate your ongoing commitment to your health goals. Please review the following plan we discussed and let me know if I can assist you in the future.   These are the goals we discussed:  Goals      DIET - EAT MORE FRUITS AND VEGETABLES     DIET - INCREASE WATER INTAKE     Recommend to start drinking at least 2-4 glasses of water a day.   08/29/18: Recommend to continue increasing water intake to at least 6-8 8oz glasses of water per day.         This is a list of the screening recommended for you and due dates:  Health Maintenance  Topic Date Due   DTaP/Tdap/Td vaccine (1 - Tdap) Never done   Zoster (Shingles) Vaccine (1 of 2) Never done   COVID-19 Vaccine (3 - Moderna risk series) 09/29/2019   DEXA scan (bone density measurement)  02/14/2021   Pneumonia Vaccine (1 of 1 - PCV) 11/29/2022*   Flu Shot  02/21/2023   Medicare Annual Wellness Visit  11/20/2023   Colon Cancer Screening  11/22/2024   HPV Vaccine  Aged Out  *Topic was postponed. The date shown is not the original due date.    Advanced directives: yes  Conditions/risks identified: low falls risk  Next appointment: Follow up in one year for your annual wellness visit 11/25/2023 @ 3pm telephone   Preventive Care 65 Years and Older, Female Preventive care refers to lifestyle choices and visits with your health care provider that can promote health and wellness. What does preventive care include? A yearly physical exam. This is also called an annual well check. Dental exams once or twice a year. Routine eye exams. Ask your health care provider how often you should have your eyes checked. Personal lifestyle choices, including: Daily care of your teeth and gums. Regular physical activity. Eating a healthy diet. Avoiding tobacco and drug use. Limiting alcohol use. Practicing safe sex. Taking low-dose aspirin every day. Taking  vitamin and mineral supplements as recommended by your health care provider. What happens during an annual well check? The services and screenings done by your health care provider during your annual well check will depend on your age, overall health, lifestyle risk factors, and family history of disease. Counseling  Your health care provider may ask you questions about your: Alcohol use. Tobacco use. Drug use. Emotional well-being. Home and relationship well-being. Sexual activity. Eating habits. History of falls. Memory and ability to understand (cognition). Work and work Astronomer. Reproductive health. Screening  You may have the following tests or measurements: Height, weight, and BMI. Blood pressure. Lipid and cholesterol levels. These may be checked every 5 years, or more frequently if you are over 3 years old. Skin check. Lung cancer screening. You may have this screening every year starting at age 8 if you have a 30-pack-year history of smoking and currently smoke or have quit within the past 15 years. Fecal occult blood test (FOBT) of the stool. You may have this test every year starting at age 73. Flexible sigmoidoscopy or colonoscopy. You may have a sigmoidoscopy every 5 years or a colonoscopy every 10 years starting at age 62. Hepatitis C blood test. Hepatitis B blood test. Sexually transmitted disease (STD) testing. Diabetes screening. This is done by checking your blood sugar (glucose) after you have not eaten for a while (fasting). You may have this  done every 1-3 years. Bone density scan. This is done to screen for osteoporosis. You may have this done starting at age 20. Mammogram. This may be done every 1-2 years. Talk to your health care provider about how often you should have regular mammograms. Talk with your health care provider about your test results, treatment options, and if necessary, the need for more tests. Vaccines  Your health care provider may  recommend certain vaccines, such as: Influenza vaccine. This is recommended every year. Tetanus, diphtheria, and acellular pertussis (Tdap, Td) vaccine. You may need a Td booster every 10 years. Zoster vaccine. You may need this after age 64. Pneumococcal 13-valent conjugate (PCV13) vaccine. One dose is recommended after age 55. Pneumococcal polysaccharide (PPSV23) vaccine. One dose is recommended after age 39. Talk to your health care provider about which screenings and vaccines you need and how often you need them. This information is not intended to replace advice given to you by your health care provider. Make sure you discuss any questions you have with your health care provider. Document Released: 08/05/2015 Document Revised: 03/28/2016 Document Reviewed: 05/10/2015 Elsevier Interactive Patient Education  2017 [place] Prevention in the Home Falls can cause injuries. They can happen to people of all ages. There are many things you can do to make your home safe and to help prevent falls. What can I do on the outside of my home? Regularly fix the edges of walkways and driveways and fix any cracks. Remove anything that might make you trip as you walk through a door, such as a raised step or threshold. Trim any bushes or trees on the path to your home. Use bright outdoor lighting. Clear any walking paths of anything that might make someone trip, such as rocks or tools. Regularly check to see if handrails are loose or broken. Make sure that both sides of any steps have handrails. Any raised decks and porches should have guardrails on the edges. Have any leaves, snow, or ice cleared regularly. Use sand or salt on walking paths during winter. Clean up any spills in your garage right away. This includes oil or grease spills. What can I do in the bathroom? Use night lights. Install grab bars by the toilet and in the tub and shower. Do not use towel bars as grab bars. Use non-skid  mats or decals in the tub or shower. If you need to sit down in the shower, use a plastic, non-slip stool. Keep the floor dry. Clean up any water that spills on the floor as soon as it happens. Remove soap buildup in the tub or shower regularly. Attach bath mats securely with double-sided non-slip rug tape. Do not have throw rugs and other things on the floor that can make you trip. What can I do in the bedroom? Use night lights. Make sure that you have a light by your bed that is easy to reach. Do not use any sheets or blankets that are too big for your bed. They should not hang down onto the floor. Have a firm chair that has side arms. You can use this for support while you get dressed. Do not have throw rugs and other things on the floor that can make you trip. What can I do in the kitchen? Clean up any spills right away. Avoid walking on wet floors. Keep items that you use a lot in easy-to-reach places. If you need to reach something above you, use a strong step stool that  has a grab bar. Keep electrical cords out of the way. Do not use floor polish or wax that makes floors slippery. If you must use wax, use non-skid floor wax. Do not have throw rugs and other things on the floor that can make you trip. What can I do with my stairs? Do not leave any items on the stairs. Make sure that there are handrails on both sides of the stairs and use them. Fix handrails that are broken or loose. Make sure that handrails are as long as the stairways. Check any carpeting to make sure that it is firmly attached to the stairs. Fix any carpet that is loose or worn. Avoid having throw rugs at the top or bottom of the stairs. If you do have throw rugs, attach them to the floor with carpet tape. Make sure that you have a light switch at the top of the stairs and the bottom of the stairs. If you do not have them, ask someone to add them for you. What else can I do to help prevent falls? Wear shoes  that: Do not have high heels. Have rubber bottoms. Are comfortable and fit you well. Are closed at the toe. Do not wear sandals. If you use a stepladder: Make sure that it is fully opened. Do not climb a closed stepladder. Make sure that both sides of the stepladder are locked into place. Ask someone to hold it for you, if possible. Clearly mark and make sure that you can see: Any grab bars or handrails. First and last steps. Where the edge of each step is. Use tools that help you move around (mobility aids) if they are needed. These include: Canes. Walkers. Scooters. Crutches. Turn on the lights when you go into a dark area. Replace any light bulbs as soon as they burn out. Set up your furniture so you have a clear path. Avoid moving your furniture around. If any of your floors are uneven, fix them. If there are any pets around you, be aware of where they are. Review your medicines with your doctor. Some medicines can make you feel dizzy. This can increase your chance of falling. Ask your doctor what other things that you can do to help prevent falls. This information is not intended to replace advice given to you by your health care provider. Make sure you discuss any questions you have with your health care provider. Document Released: 05/05/2009 Document Revised: 12/15/2015 Document Reviewed: 08/13/2014 Elsevier Interactive Patient Education  2017 [employer].

## 2022-11-20 NOTE — Progress Notes (Signed)
I connected with  Candie Mile on 11/20/22 by a audio enabled telemedicine application and verified that I am speaking with the correct person using two identifiers.  Patient Location: Home  Provider Location: Office/Clinic  I discussed the limitations of evaluation and management by telemedicine. The patient expressed understanding and agreed to proceed.  Subjective:   Stacey Fitzgerald is a 82 y.o. female who presents for Medicare Annual (Subsequent) preventive examination.  Review of Systems    Cardiac Risk Factors include: advanced age (>76men, >81 women);dyslipidemia;sedentary lifestyle;hypertension    Objective:    Today's Vitals   11/20/22 1530  Weight: 160 lb (72.6 kg)  Height: 5\' 10"  (1.778 m)   Body mass index is 22.96 kg/m.     11/20/2022    3:39 PM 11/08/2021   10:59 AM 11/23/2019   12:56 PM 09/16/2019   11:50 AM 08/29/2018   10:16 AM 08/13/2017    2:43 PM 02/04/2015    9:43 AM  Advanced Directives  Does Patient Have a Medical Advance Directive? Yes No Yes Yes Yes Yes Yes  Type of Advance Directive    Living will;Healthcare Power of [employer] Power of [place];Living will Living will Healthcare Power of [place];Living will  Does patient want to make changes to medical advance directive?       No - Patient declined  Copy of Healthcare Power of Attorney in Chart?    No - copy requested No - copy requested    Would patient like information on creating a medical advance directive?  No - Patient declined         Current Medications (verified) Outpatient Encounter Medications as of 11/20/2022  Medication Sig   Acetaminophen (TYLENOL 8 HOUR ARTHRITIS PAIN PO) Take by mouth as needed.   lisinopril (ZESTRIL) 20 MG tablet Take 1 tablet (20 mg total) by mouth 2 (two) times daily.   simvastatin (ZOCOR) 20 MG tablet Take 1 tablet (20 mg total) by mouth at bedtime.   diclofenac Sodium (VOLTAREN) 1 % GEL  (Patient not taking: Reported on 11/20/2022)   No  facility-administered encounter medications on file as of 11/20/2022.    Allergies (verified) Cephalexin   History: Past Medical History:  Diagnosis Date   Arthritis    Female cystocele    Hyperlipidemia    Hypertension    Hypothyroidism    Menopausal state    Obesity, unspecified    Pessary maintenance    Rectocele    Past Surgical History:  Procedure Laterality Date   BREAST EXCISIONAL BIOPSY Left 1972   benign   BREAST SURGERY     benign cyst   COLONOSCOPY     COLONOSCOPY WITH PROPOFOL N/A 11/23/2019   Procedure: COLONOSCOPY WITH PROPOFOL;  Surgeon: Toledo, Boykin Nearing, MD;  Location: [facility] ENDOSCOPY;  Service: Gastroenterology;  Laterality: N/A;   miscarrage     TUBAL LIGATION     uterine pessary     Family History  Problem Relation Age of Onset   Diabetes Mother    Seizures Mother    CVA Father    Dementia Brother    Diabetes Maternal Grandfather    Mental illness Sister    Breast cancer Neg Hx    Social History   Socioeconomic History   Marital status: Married    Spouse name: Not on file   Number of children: 2   Years of education: Not on file   Highest education level: 12th grade  Occupational History   Occupation: retired  Tobacco Use   Smoking status: Never   Smokeless tobacco: Never  Vaping Use   Vaping Use: Never used  Substance and Sexual Activity   Alcohol use: Not Currently    Alcohol/week: 0.0 standard drinks of alcohol   Drug use: No   Sexual activity: Not on file  Other Topics Concern   Not on file  Social History Narrative   Not on file   Social Determinants of Health   Financial Resource Strain: Low Risk  (11/20/2022)   Overall Financial Resource Strain (CARDIA)    Difficulty of Paying Living Expenses: Not hard at all  Food Insecurity: No Food Insecurity (11/20/2022)   Hunger Vital Sign    Worried About Running Out of Food in the Last Year: Never true    Ran Out of Food in the Last Year: Never true  Transportation Needs: No  Transportation Needs (11/20/2022)   PRAPARE - Administrator, Civil Service (Medical): No    Lack of Transportation (Non-Medical): No  Physical Activity: Inactive (11/20/2022)   Exercise Vital Sign    Days of Exercise per Week: 0 days    Minutes of Exercise per Session: 0 min  Stress: No Stress Concern Present (11/20/2022)   [employer] of Occupational Health - Occupational Stress Questionnaire    Feeling of Stress : Not at all  Social Connections: Moderately Integrated (11/20/2022)   Social Connection and Isolation Panel [NHANES]    Frequency of Communication with Friends and Family: More than three times a week    Frequency of Social Gatherings with Friends and Family: Once a week    Attends Religious Services: More than 4 times per year    Active Member of [employer] or Organizations: No    Attends Engineer, structural: Never    Marital Status: Married    Tobacco Counseling Counseling given: Not Answered  Clinical Intake:  Pre-visit preparation completed: Yes  Pain : No/denies pain   BMI - recorded: 22.96 Nutritional Status: BMI of 19-24  Normal Nutritional Risks: None Diabetes: No  How often do you need to have someone help you when you read instructions, pamphlets, or other written materials from your doctor or pharmacy?: 1 - Never  Diabetic? NO  Interpreter Needed?: No  Comments: lives alone Information entered by :: B.Leonela Kivi,LPN   Activities of Daily Living    11/20/2022    3:40 PM 10/24/2022    9:43 AM  In your present state of health, do you have any difficulty performing the following activities:  Hearing? 0 0  Vision? 0 0  Difficulty concentrating or making decisions? 0 0  Walking or climbing stairs? 0 0  Dressing or bathing? 0 0  Doing errands, shopping? 0 0  Preparing Food and eating ? N   Using the Toilet? N   In the past six months, have you accidently leaked urine? N   Do you have problems with loss of bowel control? N    Managing your Medications? N   Managing your Finances? N   Housekeeping or managing your Housekeeping? N     Patient Care Team: Alfredia Ferguson, PA-C as PCP - General (Physician Assistant) Ward, Elenora Fender, MD as Referring Physician (Obstetrics and Gynecology)  Indicate any recent Medical Services you may have received from other than [facility] providers in the past year (date may be approximate).     Assessment:   This is a routine wellness examination for [place].  Hearing/Vision screen Hearing Screening - Comments:: Adequate  hearing Vision Screening - Comments:: Adequate vision:readers only [employer]-no regular checks  Dietary issues and exercise activities discussed: Current Exercise Habits: The patient does not participate in regular exercise at present, Exercise limited by: orthopedic condition(s)   Goals Addressed             This Visit's Progress    DIET - EAT MORE FRUITS AND VEGETABLES   On track    DIET - INCREASE WATER INTAKE   Not on track    Recommend to start drinking at least 2-4 glasses of water a day.   08/29/18: Recommend to continue increasing water intake to at least 6-8 8oz glasses of water per day.        Depression Screen    11/20/2022    3:35 PM 10/24/2022    9:43 AM 05/25/2022    8:56 AM 11/08/2021   10:56 AM 12/23/2020    9:38 AM 05/17/2020   10:09 AM 04/19/2020    2:33 PM  PHQ 2/9 Scores  PHQ - 2 Score 1 0 0 0 0 0 0  PHQ- 9 Score 1 0 0  0  0    Fall Risk    11/20/2022    3:33 PM 10/24/2022    9:42 AM 05/25/2022    8:56 AM 11/08/2021   11:00 AM 12/23/2020    9:37 AM  Fall Risk   Falls in the past year? 0 0 0 0 0  Number falls in past yr: 0 0 0 0 0  Injury with Fall? 0 0 0 0 0  Risk for fall due to : No Fall Risks No Fall Risks No Fall Risks No Fall Risks No Fall Risks  Follow up Falls prevention discussed;Education provided Falls evaluation completed Falls evaluation completed Falls evaluation completed Falls evaluation completed    FALL RISK  PREVENTION PERTAINING TO THE HOME:  Any stairs in or around the home? Yes  If so, are there any without handrails? Yes  Home free of loose throw rugs in walkways, pet beds, electrical cords, etc? Yes  Adequate lighting in your home to reduce risk of falls? Yes   ASSISTIVE DEVICES UTILIZED TO PREVENT FALLS:  Life alert? No  Use of a cane, walker or w/c? No  Grab bars in the bathroom? Yes  Shower chair or bench in shower? Yes  Elevated toilet seat or a handicapped toilet? Yes   Cognitive Function:    10/24/2022   10:01 AM  MMSE - Mini Mental State Exam  Orientation to time 2  Orientation to Place 3  Registration 2  Attention/ Calculation 1  Recall 1  Language- name 2 objects 2  Language- repeat 0  Language- follow 3 step command 2  Language- read & follow direction 1  Write a sentence 1  Copy design 1  Total score 16        11/20/2022    3:41 PM  6CIT Screen  What Year? 0 points  What month? 0 points  What time? 0 points  Count back from 20 4 points  Months in reverse 4 points  Repeat phrase 8 points  Total Score 16 points    Immunizations Immunization History  Administered Date(s) Administered   Moderna Sars-Covid-2 Vaccination 08/04/2019, 09/01/2019    TDAP status: Due, Education has been provided regarding the importance of this vaccine. Advised may receive this vaccine at local pharmacy or Health Dept. Aware to provide a copy of the vaccination record if obtained from local pharmacy or Health Dept.  Verbalized acceptance and understanding.  Flu Vaccine status: Declined, Education has been provided regarding the importance of this vaccine but patient still declined. Advised may receive this vaccine at local pharmacy or Health Dept. Aware to provide a copy of the vaccination record if obtained from local pharmacy or Health Dept. Verbalized acceptance and understanding.  Pneumococcal vaccine status: Declined,  Education has been provided regarding the importance of  this vaccine but patient still declined. Advised may receive this vaccine at local pharmacy or Health Dept. Aware to provide a copy of the vaccination record if obtained from local pharmacy or Health Dept. Verbalized acceptance and understanding.   Covid-19 vaccine status: Completed vaccines  Qualifies for Shingles Vaccine? Yes   Zostavax completed No   Shingrix Completed?: No.    Education has been provided regarding the importance of this vaccine. Patient has been advised to call insurance company to determine out of pocket expense if they have not yet received this vaccine. Advised may also receive vaccine at local pharmacy or Health Dept. Verbalized acceptance and understanding.  Screening Tests Health Maintenance  Topic Date Due   DTaP/Tdap/Td (1 - Tdap) Never done   Zoster Vaccines- Shingrix (1 of 2) Never done   COVID-19 Vaccine (3 - Moderna risk series) 09/29/2019   DEXA SCAN  02/14/2021   Pneumonia Vaccine 41+ Years old (1 of 1 - PCV) 11/29/2022 (Originally 10/11/2006)   INFLUENZA VACCINE  02/21/2023   Medicare Annual Wellness (AWV)  11/20/2023   COLONOSCOPY (Pts 45-35yrs Insurance coverage will need to be confirmed)  11/22/2024   HPV VACCINES  Aged Out    Health Maintenance  Health Maintenance Due  Topic Date Due   DTaP/Tdap/Td (1 - Tdap) Never done   Zoster Vaccines- Shingrix (1 of 2) Never done   COVID-19 Vaccine (3 - Moderna risk series) 09/29/2019   DEXA SCAN  02/14/2021    Colorectal cancer screening: No longer required.   Mammogram status: No longer required due to age.  Bone Density status: Completed yes. Results reflect: Bone density results: NORMAL. Repeat every 5 years.  Lung Cancer Screening: (Low Dose CT Chest recommended if Age 17-80 years, 30 pack-year currently smoking OR have quit w/in 15years.) does not qualify.   Lung Cancer Screening Referral: no  Additional Screening:  Hepatitis C Screening: does not qualify; Completed yes  Vision Screening:  Recommended annual ophthalmology exams for early detection of glaucoma and other disorders of the eye. Is the patient up to date with their annual eye exam?  No  Who is the provider or what is the name of the office in which the patient attends annual eye exams? Does not remember right now If pt is not established with a provider, would they like to be referred to a provider to establish care? No . Pt declines  Dental Screening: Recommended annual dental exams for proper oral hygiene  Community Resource Referral / Chronic Care Management: CRR required this visit?  No   CCM required this visit?  No    Plan:     I have personally reviewed and noted the following in the patient's chart:   Medical and social history Use of alcohol, tobacco or illicit drugs  Current medications and supplements including opioid prescriptions. Patient is not currently taking opioid prescriptions. Functional ability and status Nutritional status Physical activity Advanced directives List of other physicians Hospitalizations, surgeries, and ER visits in previous 12 months Vitals Screenings to include cognitive, depression, and falls Referrals and appointments  In addition,  I have reviewed and discussed with patient certain preventive protocols, quality metrics, and best practice recommendations. A written personalized care plan for preventive services as well as general preventive health recommendations were provided to patient.     Sue Lush, LPN   04/05/7828   Nurse Notes: The patient states she is doing well since the passing of her husband last fall. She relays her son helps her with anything she needs.She has no concerns or questions at this time.

## 2022-11-21 ENCOUNTER — Ambulatory Visit: Payer: Medicare Other | Admitting: Physician Assistant

## 2022-11-29 ENCOUNTER — Encounter: Payer: Self-pay | Admitting: Physician Assistant

## 2022-11-29 ENCOUNTER — Ambulatory Visit (INDEPENDENT_AMBULATORY_CARE_PROVIDER_SITE_OTHER): Payer: Medicare Other | Admitting: Physician Assistant

## 2022-11-29 VITALS — BP 143/68 | HR 93 | Ht 68.0 in | Wt 159.0 lb

## 2022-11-29 DIAGNOSIS — E7800 Pure hypercholesterolemia, unspecified: Secondary | ICD-10-CM | POA: Diagnosis not present

## 2022-11-29 DIAGNOSIS — F03A0 Unspecified dementia, mild, without behavioral disturbance, psychotic disturbance, mood disturbance, and anxiety: Secondary | ICD-10-CM | POA: Insufficient documentation

## 2022-11-29 DIAGNOSIS — R29818 Other symptoms and signs involving the nervous system: Secondary | ICD-10-CM | POA: Insufficient documentation

## 2022-11-29 DIAGNOSIS — I10 Essential (primary) hypertension: Secondary | ICD-10-CM | POA: Diagnosis not present

## 2022-11-29 MED ORDER — DONEPEZIL HCL 5 MG PO TABS
5.0000 mg | ORAL_TABLET | Freq: Every day | ORAL | 1 refills | Status: DC
Start: 2022-11-29 — End: 2023-10-03

## 2022-11-29 NOTE — Progress Notes (Signed)
Established patient visit   Patient: Stacey Fitzgerald   DOB: 10-Jan-1941   82 y.o. Female  MRN: [id number] Visit Date: 11/29/2022  Today's healthcare provider: Alfredia Ferguson, PA-C   Cc. Memory changes  Subjective    HPI  Pt present with son Casimiro Needle today. Goal of appointment to discuss worsening dementia/memory changes and ensure safety in the home.  Her son is her financial power of attorney, he manages all her fiances. He has been discussing her ongoing condition with her and is acutely aware of all changes in the last 6 months. He lives in [place] but is as present as he can be.    Medications: Outpatient Medications Prior to Visit  Medication Sig   Acetaminophen (TYLENOL 8 HOUR ARTHRITIS PAIN PO) Take by mouth as needed.   diclofenac Sodium (VOLTAREN) 1 % GEL    lisinopril (ZESTRIL) 20 MG tablet Take 1 tablet (20 mg total) by mouth 2 (two) times daily.   simvastatin (ZOCOR) 20 MG tablet Take 1 tablet (20 mg total) by mouth at bedtime.   No facility-administered medications prior to visit.    Review of Systems  Constitutional:  Negative for fatigue and fever.  Respiratory:  Negative for cough and shortness of breath.   Cardiovascular:  Negative for chest pain and leg swelling.  Gastrointestinal:  Negative for abdominal pain.  Neurological:  Negative for dizziness and headaches.      Objective    BP (!) 143/68 (BP Location: Left Arm, Patient Position: Sitting, Cuff Size: Normal)   Pulse 93   Ht 5\' 8"  (1.727 m)   Wt 159 lb (72.1 kg)   SpO2 98%   BMI 24.18 kg/m    Physical Exam Vitals reviewed.  Constitutional:      Appearance: She is not ill-appearing.  HENT:     Head: Normocephalic.  Eyes:     Conjunctiva/sclera: Conjunctivae normal.  Cardiovascular:     Rate and Rhythm: Normal rate.  Pulmonary:     Effort: Pulmonary effort is normal. No respiratory distress.  Neurological:     General: No focal deficit present.     Mental Status: She is alert.      Comments: Pt repeats various memories or statements throughout the visit. She is able to follow the conversation on and off, and answers all questions addressed to her  Psychiatric:        Mood and Affect: Mood normal.        Behavior: Behavior normal.      No results found for any visits on 11/29/22.  Assessment & Plan     Problem List Items Addressed This Visit       Cardiovascular and Mediastinum   Essential hypertension   Relevant Orders   AMB Referral to Community Care Coordinaton (ACO Patients)     Nervous and Auditory   Mild dementia without behavioral disturbance, psychotic disturbance, mood disturbance, or anxiety (HCC) - Primary    Discussion on medication trial vs neuro referral, will try donepezil first, f/u 4 weeks. Lengthy discussion w/ son regarding her home care, finances, end of life goals, etc.  Son was given paperwork to become medical power of attorney.  Overall I had a very good impression and feel her safety and comfort at home is well taken care of .      Relevant Medications   donepezil (ARICEPT) 5 MG tablet   Other Relevant Orders   AMB Referral to [facility] Coordin[facility] ([facility]O Patients)  Other   Pure hypercholesterolemia   Relevant Orders   AMB Referral to [facility] Coordinaton (ACO Patients)     Return in about 4 weeks (around 12/27/2022) for memory loss.      I, Alfredia Ferguson, PA-C have reviewed all documentation for this visit. The documentation on  11/29/22   for the exam, diagnosis, procedures, and orders are all accurate and complete.  Alfredia Ferguson, PA-C [facility] [address] [place], [place], [address] Office: [phone] Fax: [phone]   [facility]

## 2022-11-29 NOTE — Assessment & Plan Note (Signed)
Discussion on medication trial vs neuro referral, will try donepezil first, f/u 4 weeks. Lengthy discussion w/ son regarding her home care, finances, end of life goals, etc.  Son was given paperwork to become medical power of attorney.  Overall I had a very good impression and feel her safety and comfort at home is well taken care of .

## 2022-12-03 ENCOUNTER — Telehealth: Payer: Self-pay | Admitting: *Deleted

## 2022-12-03 NOTE — Progress Notes (Signed)
  Care Coordination  Outreach Note  12/03/2022 Name: Stacey Fitzgerald MRN: [id number] DOB: 05-Sep-1940   Care Coordination Outreach Attempts: An unsuccessful telephone outreach was attempted today to offer the patient information about available care coordination services.  Follow Up Plan:  Additional outreach attempts will be made to offer the patient care coordination information and services.   Encounter Outcome:  No Answer  Burman Nieves, CCMA Care Coordination Care Guide Direct Dial: [phone]

## 2022-12-06 NOTE — Progress Notes (Signed)
  Care Coordination  Outreach Note  12/06/2022 Name: Stacey Fitzgerald MRN: [id number] DOB: 08/10/1940   Care Coordination Outreach Attempts: A second unsuccessful outreach was attempted today to offer the patient with information about available care coordination services.  Follow Up Plan:  Additional outreach attempts will be made to offer the patient care coordination information and services.   Encounter Outcome:  No Answer  Burman Nieves, CCMA Care Coordination Care Guide Direct Dial: [phone]

## 2022-12-08 ENCOUNTER — Other Ambulatory Visit: Payer: Self-pay | Admitting: Physician Assistant

## 2022-12-08 DIAGNOSIS — I10 Essential (primary) hypertension: Secondary | ICD-10-CM

## 2022-12-10 NOTE — Progress Notes (Signed)
  Care Coordination  Outreach Note  12/10/2022 Name: Stacey Fitzgerald MRN: [id number] DOB: 1941/06/27   Care Coordination Outreach Attempts: A third unsuccessful outreach was attempted today to offer the patient with information about available care coordination services.  Follow Up Plan:  No further outreach attempts will be made at this time. We have been unable to contact the patient to offer or enroll patient in care coordination services  Encounter Outcome:  No Answer  Burman Nieves, [facility] Care Coordination Care Guide Direct Dial: [phone]

## 2022-12-10 NOTE — Telephone Encounter (Signed)
Requested Prescriptions  Pending Prescriptions Disp Refills   lisinopril (ZESTRIL) 20 MG tablet [Pharmacy Med Name: LISINOPRIL 20 MG TABLET] 180 tablet 1    Sig: TAKE 1 TABLET BY MOUTH TWICE A DAY     Cardiovascular:  ACE Inhibitors Failed - 12/08/2022  1:10 AM      Failed - Cr in normal range and within 180 days    Creat  Date Value Ref Range Status  05/31/2017 0.87 0.60 - 0.93 mg/dL Final    Comment:    For patients >81 years of age, the reference limit for Creatinine is approximately 13% higher for people identified as African-American. .    Creatinine, Ser  Date Value Ref Range Status  10/24/2022 1.05 (H) 0.57 - 1.00 mg/dL Final         Failed - Last BP in normal range    BP Readings from Last 1 Encounters:  11/29/22 (!) 143/68         Passed - K in normal range and within 180 days    Potassium  Date Value Ref Range Status  10/24/2022 4.8 3.5 - 5.2 mmol/L Final         Passed - Patient is not pregnant      Passed - Valid encounter within last 6 months    Recent Outpatient Visits           1 week ago Mild dementia without behavioral disturbance, psychotic disturbance, mood disturbance, or anxiety, unspecified dementia type [facility])   [facility] Alfredia Ferguson, PA-C   1 month ago Annual physical exam   [facility] Alfredia Ferguson, PA-C   6 months ago Essential hypertension   [facility] Alfredia Ferguson, PA-C   9 months ago Essential hypertension   [facility] Alfredia Ferguson, PA-C   11 months ago Essential hypertension   [facility] Alfredia Ferguson, [place]

## 2023-05-16 DIAGNOSIS — Z4689 Encounter for fitting and adjustment of other specified devices: Secondary | ICD-10-CM | POA: Diagnosis not present

## 2023-06-06 ENCOUNTER — Encounter: Payer: Self-pay | Admitting: Podiatry

## 2023-06-06 ENCOUNTER — Ambulatory Visit: Payer: Medicare Other | Admitting: Podiatry

## 2023-06-06 DIAGNOSIS — M7752 Other enthesopathy of left foot: Secondary | ICD-10-CM | POA: Diagnosis not present

## 2023-06-06 DIAGNOSIS — D2372 Other benign neoplasm of skin of left lower limb, including hip: Secondary | ICD-10-CM | POA: Diagnosis not present

## 2023-06-06 NOTE — Progress Notes (Signed)
She presents today with a chief complaint of a painful callus beneath the fifth metatarsal of the left foot.  States that she thinks she needs the injection as well.  She states that she has been working on it but if it only bothers her once a year she is very happy with that.  Objective: Vital signs are stable she is alert and oriented x 3 she has a palpable bursa beneath the fifth metatarsal head of the left foot with fluctuance.  There is no purulence no drainage.  Benign skin lesions are noted in the same vicinity.  Assessment: Pain in limb secondary to benign skin lesion pain in limb secondary to bursitis.  Plan: Discussed etiology pathology and surgical therapies at this point I injected 2 mg dexamethasone local anesthetic beneath the fifth metatarsal head of the left foot I also debrided the benign skin lesions we will follow-up with her on an as-needed basis.

## 2023-06-09 ENCOUNTER — Other Ambulatory Visit: Payer: Self-pay | Admitting: Physician Assistant

## 2023-06-09 DIAGNOSIS — I10 Essential (primary) hypertension: Secondary | ICD-10-CM

## 2023-10-02 NOTE — Progress Notes (Unsigned)
 Established patient visit  Patient: Stacey Fitzgerald   DOB: 07/25/1940   83 y.o. Female  MRN: [id number] Visit Date: 10/03/2023  Today's healthcare provider: Debera Lat, PA-C   No chief complaint on file.  Subjective       Discussed the use of AI scribe software for clinical note transcription with the patient, who gave verbal consent to proceed.  History of Present Illness               11/29/2022    9:41 AM 11/20/2022    3:35 PM 10/24/2022    9:43 AM  Depression screen PHQ 2/9  Decreased Interest 0 0 0  Down, Depressed, Hopeless 0 1 0  PHQ - 2 Score 0 1 0  Altered sleeping 0 0 0  Tired, decreased energy 0 0 0  Change in appetite 0 0 0  Feeling bad or failure about yourself  0 0 0  Trouble concentrating 0 0 0  Moving slowly or fidgety/restless 0 0 0  Suicidal thoughts 0 0 0  PHQ-9 Score 0 1 0  Difficult doing work/chores Not difficult at all Not difficult at all Not difficult at all       No data to display          Medications: Outpatient Medications Prior to Visit  Medication Sig   Acetaminophen (TYLENOL 8 HOUR ARTHRITIS PAIN PO) Take by mouth as needed.   diclofenac Sodium (VOLTAREN) 1 % GEL    donepezil (ARICEPT) 5 MG tablet Take 1 tablet (5 mg total) by mouth at bedtime.   lisinopril (ZESTRIL) 20 MG tablet TAKE 1 TABLET BY MOUTH TWICE A DAY   simvastatin (ZOCOR) 20 MG tablet Take 1 tablet (20 mg total) by mouth at bedtime.   No facility-administered medications prior to visit.    Review of Systems  All other systems reviewed and are negative.  All negative Except see HPI   {Insert previous labs (optional):23779} {See past labs  Heme  Chem  Endocrine  Serology  Results Review (optional):1}   Objective    There were no vitals taken for this visit. {Insert last BP/Wt (optional):23777}{See vitals history (optional):1}   Physical Exam Vitals reviewed.  Constitutional:      General: She is not in acute distress.    Appearance: Normal  appearance. She is well-developed. She is not diaphoretic.  HENT:     Head: Normocephalic and atraumatic.  Eyes:     General: No scleral icterus.    Conjunctiva/sclera: Conjunctivae normal.  Neck:     Thyroid: No thyromegaly.  Cardiovascular:     Rate and Rhythm: Normal rate and regular rhythm.     Pulses: Normal pulses.     Heart sounds: Normal heart sounds. No murmur heard. Pulmonary:     Effort: Pulmonary effort is normal. No respiratory distress.     Breath sounds: Normal breath sounds. No wheezing, rhonchi or rales.  Musculoskeletal:     Cervical back: Neck supple.     Right lower leg: No edema.     Left lower leg: No edema.  Lymphadenopathy:     Cervical: No cervical adenopathy.  Skin:    General: Skin is warm and dry.     Findings: No rash.  Neurological:     Mental Status: She is alert and oriented to person, place, and time. Mental status is at baseline.  Psychiatric:        Mood and Affect: Mood normal.  Behavior: Behavior normal.      No results found for any visits on 10/03/23.      Assessment and Plan             No orders of the defined types were placed in this encounter.   No follow-ups on file.   The patient was advised to call back or seek an in-person evaluation if the symptoms worsen or if the condition fails to improve as anticipated.  I discussed the assessment and treatment plan with the patient. The patient was provided an opportunity to ask questions and all were answered. The patient agreed with the plan and demonstrated an understanding of the instructions.  I, Debera Lat, PA-C have reviewed all documentation for this visit. The documentation on 10/03/2023  for the exam, diagnosis, procedures, and orders are all accurate and complete.  Debera Lat, [facility], MMS [facility] [phone] (phone) [phone] (fax)  [facility]

## 2023-10-03 ENCOUNTER — Ambulatory Visit: Admitting: Physician Assistant

## 2023-10-03 ENCOUNTER — Encounter: Payer: Self-pay | Admitting: Physician Assistant

## 2023-10-03 VITALS — BP 161/76 | HR 92 | Resp 20 | Ht 68.0 in | Wt 154.0 lb

## 2023-10-03 DIAGNOSIS — R5383 Other fatigue: Secondary | ICD-10-CM | POA: Diagnosis not present

## 2023-10-03 DIAGNOSIS — F03A0 Unspecified dementia, mild, without behavioral disturbance, psychotic disturbance, mood disturbance, and anxiety: Secondary | ICD-10-CM | POA: Diagnosis not present

## 2023-10-03 DIAGNOSIS — I10 Essential (primary) hypertension: Secondary | ICD-10-CM

## 2023-10-03 DIAGNOSIS — E7800 Pure hypercholesterolemia, unspecified: Secondary | ICD-10-CM

## 2023-10-03 DIAGNOSIS — R413 Other amnesia: Secondary | ICD-10-CM

## 2023-10-03 MED ORDER — SIMVASTATIN 20 MG PO TABS
20.0000 mg | ORAL_TABLET | Freq: Every day | ORAL | 3 refills | Status: DC
Start: 2023-10-03 — End: 2023-10-28

## 2023-10-03 MED ORDER — LISINOPRIL 20 MG PO TABS: 20.0000 mg | ORAL_TABLET | Freq: Two times a day (BID) | ORAL | 1 refills | Status: DC

## 2023-10-05 LAB — COMPREHENSIVE METABOLIC PANEL
ALT: 7 IU/L (ref 0–32)
AST: 7 IU/L (ref 0–40)
Albumin: 4.2 g/dL (ref 3.7–4.7)
Alkaline Phosphatase: 67 IU/L (ref 44–121)
BUN/Creatinine Ratio: 20 (ref 12–28)
BUN: 23 mg/dL (ref 8–27)
Bilirubin Total: 0.2 mg/dL (ref 0.0–1.2)
CO2: 21 mmol/L (ref 20–29)
Calcium: 9.7 mg/dL (ref 8.7–10.3)
Chloride: 105 mmol/L (ref 96–106)
Creatinine, Ser: 1.15 mg/dL — ABNORMAL HIGH (ref 0.57–1.00)
Globulin, Total: 2.2 g/dL (ref 1.5–4.5)
Glucose: 99 mg/dL (ref 70–99)
Potassium: 4.7 mmol/L (ref 3.5–5.2)
Sodium: 140 mmol/L (ref 134–144)
Total Protein: 6.4 g/dL (ref 6.0–8.5)
eGFR: 48 mL/min/{1.73_m2} — ABNORMAL LOW (ref >59)

## 2023-10-05 LAB — CBC WITH DIFFERENTIAL/PLATELET
Basophils Absolute: 0.1 10*3/uL (ref 0.0–0.2)
Basos: 1 %
EOS (ABSOLUTE): 0.2 10*3/uL (ref 0.0–0.4)
Eos: 2 %
Hematocrit: 23.5 % — ABNORMAL LOW (ref 34.0–46.6)
Hemoglobin: 6.7 g/dL — CL (ref 11.1–15.9)
Immature Grans (Abs): 0 10*3/uL (ref 0.0–0.1)
Immature Granulocytes: 0 %
Lymphocytes Absolute: 1.9 10*3/uL (ref 0.7–3.1)
Lymphs: 19 %
MCH: 19.6 pg — ABNORMAL LOW (ref 26.6–33.0)
MCHC: 28.5 g/dL — ABNORMAL LOW (ref 31.5–35.7)
MCV: 69 fL — ABNORMAL LOW (ref 79–97)
Monocytes Absolute: 1 10*3/uL — ABNORMAL HIGH (ref 0.1–0.9)
Monocytes: 10 %
Neutrophils Absolute: 6.7 10*3/uL (ref 1.4–7.0)
Neutrophils: 68 %
Platelets: 371 10*3/uL (ref 150–450)
RBC: 3.41 x10E6/uL — ABNORMAL LOW (ref 3.77–5.28)
RDW: 14.7 % (ref 11.7–15.4)
WBC: 9.8 10*3/uL (ref 3.4–10.8)

## 2023-10-05 LAB — LIPID PANEL
Chol/HDL Ratio: 4 ratio (ref 0.0–4.4)
Cholesterol, Total: 166 mg/dL (ref 100–199)
HDL: 42 mg/dL (ref >39)
LDL Chol Calc (NIH): 101 mg/dL — ABNORMAL HIGH (ref 0–99)
Triglycerides: 131 mg/dL (ref 0–149)
VLDL Cholesterol Cal: 23 mg/dL (ref 5–40)

## 2023-10-07 ENCOUNTER — Telehealth: Payer: Self-pay

## 2023-10-07 ENCOUNTER — Encounter: Payer: Self-pay | Admitting: Physician Assistant

## 2023-10-07 DIAGNOSIS — D649 Anemia, unspecified: Secondary | ICD-10-CM

## 2023-10-07 NOTE — Telephone Encounter (Signed)
 Copied from CRM [id number]. Topic: General - Other >> Oct 07, 2023  8:25 AM Fredrich Romans wrote: Reason for CRM: Patient would like to know if she should go to the ED or come  in for another office visit.She said the day she received call to go to ED ,she wasn't able to but she can today to get her hemoglobin checked.

## 2023-10-07 NOTE — Telephone Encounter (Signed)
 Pt's son, called in states that pt doesn't want to go to the ER unless, he goes with her. He states he let pt know if it gets worse she should go to the ER.

## 2023-10-07 NOTE — Telephone Encounter (Signed)
 Son was informed of need to go to the ER.  See phone encounter

## 2023-10-08 ENCOUNTER — Ambulatory Visit: Admitting: Physician Assistant

## 2023-10-08 ENCOUNTER — Encounter (HOSPITAL_COMMUNITY): Payer: Self-pay | Admitting: *Deleted

## 2023-10-08 ENCOUNTER — Inpatient Hospital Stay (HOSPITAL_COMMUNITY)
Admission: EM | Admit: 2023-10-08 | Discharge: 2023-10-17 | DRG: 331 | Disposition: A | Discharge status: Skilled Nursing Facility | Attending: Family Medicine | Admitting: Family Medicine

## 2023-10-08 ENCOUNTER — Other Ambulatory Visit: Payer: Self-pay

## 2023-10-08 DIAGNOSIS — R5381 Other malaise: Secondary | ICD-10-CM | POA: Diagnosis present

## 2023-10-08 DIAGNOSIS — Z860100 Personal history of colon polyps, unspecified: Secondary | ICD-10-CM

## 2023-10-08 DIAGNOSIS — K921 Melena: Secondary | ICD-10-CM | POA: Diagnosis not present

## 2023-10-08 DIAGNOSIS — D509 Iron deficiency anemia, unspecified: Secondary | ICD-10-CM | POA: Diagnosis not present

## 2023-10-08 DIAGNOSIS — E039 Hypothyroidism, unspecified: Secondary | ICD-10-CM | POA: Diagnosis present

## 2023-10-08 DIAGNOSIS — Z6824 Body mass index (BMI) 24.0-24.9, adult: Secondary | ICD-10-CM

## 2023-10-08 DIAGNOSIS — M6281 Muscle weakness (generalized): Secondary | ICD-10-CM | POA: Diagnosis present

## 2023-10-08 DIAGNOSIS — F03A0 Unspecified dementia, mild, without behavioral disturbance, psychotic disturbance, mood disturbance, and anxiety: Secondary | ICD-10-CM | POA: Diagnosis present

## 2023-10-08 DIAGNOSIS — D500 Iron deficiency anemia secondary to blood loss (chronic): Secondary | ICD-10-CM | POA: Diagnosis present

## 2023-10-08 DIAGNOSIS — K222 Esophageal obstruction: Secondary | ICD-10-CM | POA: Diagnosis present

## 2023-10-08 DIAGNOSIS — Z823 Family history of stroke: Secondary | ICD-10-CM

## 2023-10-08 DIAGNOSIS — D649 Anemia, unspecified: Secondary | ICD-10-CM | POA: Diagnosis not present

## 2023-10-08 DIAGNOSIS — C182 Malignant neoplasm of ascending colon: Principal | ICD-10-CM

## 2023-10-08 DIAGNOSIS — D630 Anemia in neoplastic disease: Secondary | ICD-10-CM | POA: Diagnosis present

## 2023-10-08 DIAGNOSIS — Z881 Allergy status to other antibiotic agents status: Secondary | ICD-10-CM

## 2023-10-08 DIAGNOSIS — E7800 Pure hypercholesterolemia, unspecified: Secondary | ICD-10-CM | POA: Diagnosis present

## 2023-10-08 DIAGNOSIS — R29818 Other symptoms and signs involving the nervous system: Secondary | ICD-10-CM | POA: Diagnosis present

## 2023-10-08 DIAGNOSIS — K449 Diaphragmatic hernia without obstruction or gangrene: Secondary | ICD-10-CM | POA: Diagnosis present

## 2023-10-08 DIAGNOSIS — R112 Nausea with vomiting, unspecified: Secondary | ICD-10-CM | POA: Diagnosis not present

## 2023-10-08 DIAGNOSIS — I10 Essential (primary) hypertension: Secondary | ICD-10-CM | POA: Diagnosis present

## 2023-10-08 DIAGNOSIS — M1990 Unspecified osteoarthritis, unspecified site: Secondary | ICD-10-CM | POA: Diagnosis present

## 2023-10-08 DIAGNOSIS — R634 Abnormal weight loss: Secondary | ICD-10-CM | POA: Diagnosis present

## 2023-10-08 DIAGNOSIS — Z79899 Other long term (current) drug therapy: Secondary | ICD-10-CM

## 2023-10-08 DIAGNOSIS — E669 Obesity, unspecified: Secondary | ICD-10-CM | POA: Diagnosis present

## 2023-10-08 DIAGNOSIS — D72829 Elevated white blood cell count, unspecified: Secondary | ICD-10-CM | POA: Diagnosis present

## 2023-10-08 DIAGNOSIS — K5730 Diverticulosis of large intestine without perforation or abscess without bleeding: Secondary | ICD-10-CM | POA: Diagnosis present

## 2023-10-08 DIAGNOSIS — Z800 Family history of malignant neoplasm of digestive organs: Secondary | ICD-10-CM

## 2023-10-08 DIAGNOSIS — K641 Second degree hemorrhoids: Secondary | ICD-10-CM | POA: Diagnosis present

## 2023-10-08 DIAGNOSIS — Z818 Family history of other mental and behavioral disorders: Secondary | ICD-10-CM

## 2023-10-08 DIAGNOSIS — Z833 Family history of diabetes mellitus: Secondary | ICD-10-CM

## 2023-10-08 LAB — COMPREHENSIVE METABOLIC PANEL
ALT: 8 U/L (ref 0–44)
AST: 10 U/L — ABNORMAL LOW (ref 15–41)
Albumin: 3.7 g/dL (ref 3.5–5.0)
Alkaline Phosphatase: 55 U/L (ref 38–126)
Anion gap: 10 (ref 5–15)
BUN: 21 mg/dL (ref 8–23)
CO2: 21 mmol/L — ABNORMAL LOW (ref 22–32)
Calcium: 9.4 mg/dL (ref 8.9–10.3)
Chloride: 107 mmol/L (ref 98–111)
Creatinine, Ser: 1.06 mg/dL — ABNORMAL HIGH (ref 0.44–1.00)
GFR, Estimated: 53 mL/min — ABNORMAL LOW (ref >60)
Glucose, Bld: 101 mg/dL — ABNORMAL HIGH (ref 70–99)
Potassium: 4.3 mmol/L (ref 3.5–5.1)
Sodium: 138 mmol/L (ref 135–145)
Total Bilirubin: 0.4 mg/dL (ref 0.0–1.2)
Total Protein: 6.9 g/dL (ref 6.5–8.1)

## 2023-10-08 LAB — CBC WITH DIFFERENTIAL/PLATELET
Abs Immature Granulocytes: 0.04 10*3/uL (ref 0.00–0.07)
Basophils Absolute: 0.1 10*3/uL (ref 0.0–0.1)
Basophils Relative: 1 %
Eosinophils Absolute: 0.2 10*3/uL (ref 0.0–0.5)
Eosinophils Relative: 2 %
HCT: 22.7 % — ABNORMAL LOW (ref 36.0–46.0)
Hemoglobin: 6.4 g/dL — CL (ref 12.0–15.0)
Immature Granulocytes: 0 %
Lymphocytes Relative: 15 %
Lymphs Abs: 1.4 10*3/uL (ref 0.7–4.0)
MCH: 19.7 pg — ABNORMAL LOW (ref 26.0–34.0)
MCHC: 28.2 g/dL — ABNORMAL LOW (ref 30.0–36.0)
MCV: 69.8 fL — ABNORMAL LOW (ref 80.0–100.0)
Monocytes Absolute: 0.8 10*3/uL (ref 0.1–1.0)
Monocytes Relative: 8 %
Neutro Abs: 7 10*3/uL (ref 1.7–7.7)
Neutrophils Relative %: 74 %
Platelets: 405 10*3/uL — ABNORMAL HIGH (ref 150–400)
RBC: 3.25 MIL/uL — ABNORMAL LOW (ref 3.87–5.11)
RDW: 16 % — ABNORMAL HIGH (ref 11.5–15.5)
Smear Review: INCREASED
WBC: 9.5 10*3/uL (ref 4.0–10.5)
nRBC: 0 % (ref 0.0–0.2)

## 2023-10-08 LAB — PHOSPHORUS: Phosphorus: 3.2 mg/dL (ref 2.5–4.6)

## 2023-10-08 LAB — PROTIME-INR
INR: 1 (ref 0.8–1.2)
Prothrombin Time: 13.4 s (ref 11.4–15.2)

## 2023-10-08 LAB — IRON AND TIBC
Iron: 9 ug/dL — ABNORMAL LOW (ref 28–170)
Saturation Ratios: 2 % — ABNORMAL LOW (ref 10.4–31.8)
TIBC: 368 ug/dL (ref 250–450)
UIBC: 359 ug/dL

## 2023-10-08 LAB — VITAMIN B12: Vitamin B-12: 229 pg/mL (ref 180–914)

## 2023-10-08 LAB — PREPARE RBC (CROSSMATCH)

## 2023-10-08 LAB — MAGNESIUM: Magnesium: 1.9 mg/dL (ref 1.7–2.4)

## 2023-10-08 LAB — FOLATE: Folate: 7.6 ng/mL (ref >5.9)

## 2023-10-08 LAB — FERRITIN: Ferritin: 4 ng/mL — ABNORMAL LOW (ref 11–307)

## 2023-10-08 LAB — RETICULOCYTES
Immature Retic Fract: 14 % (ref 2.3–15.9)
RBC.: 3 MIL/uL — ABNORMAL LOW (ref 3.87–5.11)
Retic Count, Absolute: 27.9 10*3/uL (ref 19.0–186.0)
Retic Ct Pct: 0.9 % (ref 0.4–3.1)

## 2023-10-08 LAB — ABO/RH: ABO/RH(D): B NEG

## 2023-10-08 LAB — POC OCCULT BLOOD, ED: Occult Blood: POSITIVE

## 2023-10-08 LAB — TSH: TSH: 2.5 u[IU]/mL (ref 0.350–4.500)

## 2023-10-08 MED ORDER — HYDROMORPHONE HCL 1 MG/ML IJ SOLN: 0.5000 mg | INTRAMUSCULAR | Status: DC | PRN

## 2023-10-08 MED ORDER — LEVALBUTEROL HCL 0.63 MG/3ML IN NEBU: 0.6300 mg | INHALATION_SOLUTION | Freq: Four times a day (QID) | RESPIRATORY_TRACT | Status: DC | PRN

## 2023-10-08 MED ORDER — ACETAMINOPHEN 325 MG PO TABS
650.0000 mg | ORAL_TABLET | Freq: Four times a day (QID) | ORAL | Status: DC | PRN
  Administered 2023-10-09 – 2023-10-10 (×2): 650 mg via ORAL
  Filled 2023-10-08 (×2): qty 2

## 2023-10-08 MED ORDER — HEPARIN SODIUM (PORCINE) 5000 UNIT/ML IJ SOLN: 5000.0000 [IU] | Freq: Three times a day (TID) | INTRAMUSCULAR | Status: DC

## 2023-10-08 MED ORDER — ONDANSETRON HCL 4 MG PO TABS: 4.0000 mg | ORAL_TABLET | Freq: Four times a day (QID) | ORAL | Status: DC | PRN

## 2023-10-08 MED ORDER — SIMVASTATIN 20 MG PO TABS
20.0000 mg | ORAL_TABLET | Freq: Every day | ORAL | Status: DC
  Administered 2023-10-09 – 2023-10-16 (×8): 20 mg via ORAL
  Filled 2023-10-08 (×8): qty 1

## 2023-10-08 MED ORDER — HYDRALAZINE HCL 20 MG/ML IJ SOLN: 10.0000 mg | INTRAMUSCULAR | Status: DC | PRN

## 2023-10-08 MED ORDER — BISACODYL 5 MG PO TBEC: 5.0000 mg | DELAYED_RELEASE_TABLET | Freq: Every day | ORAL | Status: DC | PRN

## 2023-10-08 MED ORDER — FLEET ENEMA RE ENEM: 1.0000 | ENEMA | Freq: Once | RECTAL | Status: DC | PRN

## 2023-10-08 MED ORDER — SENNOSIDES-DOCUSATE SODIUM 8.6-50 MG PO TABS: 1.0000 | ORAL_TABLET | Freq: Every evening | ORAL | Status: DC | PRN

## 2023-10-08 MED ORDER — IPRATROPIUM BROMIDE 0.02 % IN SOLN: 0.5000 mg | Freq: Four times a day (QID) | RESPIRATORY_TRACT | Status: DC | PRN

## 2023-10-08 MED ORDER — TRAZODONE HCL 50 MG PO TABS
25.0000 mg | ORAL_TABLET | Freq: Every evening | ORAL | Status: DC | PRN
  Administered 2023-10-12 – 2023-10-13 (×2): 25 mg via ORAL
  Filled 2023-10-08 (×2): qty 1

## 2023-10-08 MED ORDER — SODIUM CHLORIDE 0.9% FLUSH: 3.0000 mL | Freq: Two times a day (BID) | INTRAVENOUS | Status: DC

## 2023-10-08 MED ORDER — OXYCODONE HCL 5 MG PO TABS
5.0000 mg | ORAL_TABLET | ORAL | Status: DC | PRN
  Administered 2023-10-09 – 2023-10-12 (×2): 5 mg via ORAL
  Filled 2023-10-08 (×2): qty 1

## 2023-10-08 MED ORDER — SODIUM CHLORIDE 0.9 % IV SOLN: INTRAVENOUS | Status: AC

## 2023-10-08 MED ORDER — SODIUM CHLORIDE 0.9% FLUSH
3.0000 mL | Freq: Two times a day (BID) | INTRAVENOUS | Status: DC
  Administered 2023-10-08 – 2023-10-17 (×18): 3 mL via INTRAVENOUS

## 2023-10-08 MED ORDER — PANTOPRAZOLE SODIUM 40 MG IV SOLR
40.0000 mg | Freq: Once | INTRAVENOUS | Status: AC
  Administered 2023-10-08: 40 mg via INTRAVENOUS
  Filled 2023-10-08: qty 10

## 2023-10-08 MED ORDER — FERROUS SULFATE 325 (65 FE) MG PO TABS: 325.0000 mg | ORAL_TABLET | Freq: Two times a day (BID) | ORAL | Status: DC

## 2023-10-08 MED ORDER — ACETAMINOPHEN 650 MG RE SUPP: 650.0000 mg | Freq: Four times a day (QID) | RECTAL | Status: DC | PRN

## 2023-10-08 MED ORDER — PANTOPRAZOLE SODIUM 40 MG IV SOLR
40.0000 mg | Freq: Two times a day (BID) | INTRAVENOUS | Status: DC
  Administered 2023-10-08 – 2023-10-17 (×17): 40 mg via INTRAVENOUS
  Filled 2023-10-08 (×17): qty 10

## 2023-10-08 MED ORDER — ONDANSETRON HCL 4 MG/2ML IJ SOLN
4.0000 mg | Freq: Four times a day (QID) | INTRAMUSCULAR | Status: DC | PRN
  Administered 2023-10-10: 4 mg via INTRAVENOUS
  Filled 2023-10-08: qty 2

## 2023-10-08 MED ORDER — SODIUM CHLORIDE 0.9 % IV SOLN: INTRAVENOUS | Status: DC

## 2023-10-08 MED ORDER — PEG 3350-KCL-NA BICARB-NACL 420 G PO SOLR
4000.0000 mL | Freq: Once | ORAL | Status: AC
  Administered 2023-10-08: 4000 mL via ORAL
  Filled 2023-10-08: qty 4000

## 2023-10-08 MED ORDER — SODIUM CHLORIDE 0.9% IV SOLUTION: Freq: Once | INTRAVENOUS | Status: AC

## 2023-10-08 MED ORDER — SODIUM CHLORIDE 0.9% FLUSH: 3.0000 mL | INTRAVENOUS | Status: DC | PRN

## 2023-10-08 MED ORDER — LISINOPRIL 10 MG PO TABS
20.0000 mg | ORAL_TABLET | Freq: Two times a day (BID) | ORAL | Status: DC
  Administered 2023-10-08 – 2023-10-17 (×16): 20 mg via ORAL
  Filled 2023-10-08 (×17): qty 2

## 2023-10-08 MED ORDER — SODIUM CHLORIDE 0.9 % IV SOLN
250.0000 mg | Freq: Once | INTRAVENOUS | Status: DC
  Administered 2023-10-09: 250 mg via INTRAVENOUS
  Filled 2023-10-08: qty 20

## 2023-10-08 NOTE — Assessment & Plan Note (Addendum)
 Lipid Panel     Component Value Date/Time   CHOL 166 10/04/2023 0835   TRIG 131 10/04/2023 0835   HDL 42 10/04/2023 0835   CHOLHDL 4.0 10/04/2023 0835   CHOLHDL 5.0 (H) 05/31/2017 0814   LDLCALC 101 (H) 10/04/2023 0835   LDLCALC 151 (H) 05/31/2017 0814   LABVLDL 23 10/04/2023 0835   - Continue statins

## 2023-10-08 NOTE — Assessment & Plan Note (Signed)
-   Not on any home medications -Checking TSH

## 2023-10-08 NOTE — H&P (Addendum)
 History and Physical   Patient: Stacey Fitzgerald                            PCP: Debera Lat, PA-C                    DOB: 1941-01-04            DOA: 10/08/2023 ZOX:[id number]             DOS: 10/08/2023, 11:42 AM  Debera Lat, PA-C  Patient coming from:   HOME  I have personally reviewed patient's medical records, in electronic medical records, including:  [facility], and care everywhere.    Chief Complaint:  Generalized weakness Symptomatic anemia   History of present illness:    Stacey Fitzgerald is a 70 yof with PMH/o; hypertension, hyperlipidemia, memory deficit,?  Hypothyroidism... presenting for anemia. Denies any chest pain or shortness of breath, complaining of generalized weakness.,  "feeling drowsy, sleepy", generalized weakness. Denied having any dark, bloody stool, denies any bloody urine or vomiting blood.,  Not chronically on blood thinners. Sent in from PCP for low hemoglobin level.  ED evaluation/course: Blood pressure 129/63, pulse 61, temperature 98.3 F (36.8 C), temperature source Oral, resp. rate 18, height 5\' 8"  (1.727 m), weight 72.6 kg, SpO2 97%.  LABs; CBC; Hgb 6.4, platelets 405, BUN 21, creatinine 1.06, glucose 101 Iron 13, iron saturation 4, Hemoccult positive  EDP consulted gastroenterologist, transfusing 2U PRBC, initiating IV PPI  Requested to be admitted to medical anemia, GI bleed    Patient Denies having: Fever, Chills, Cough, SOB, Chest Pain, Abd pain, N/V/D, headache, dizziness, lightheadedness,  Dysuria, Joint pain, rash, open wounds     Review of Systems: As per HPI, otherwise 10 point review of systems were negative.   ----------------------------------------------------------------------------------------------------------------------  Allergies  Allergen Reactions   Cephalexin Hives    Patient reports that she is not allergic to this medication but she has not taken it since it was listed as an allergy     Home MEDs:  Prior to Admission medications   Medication Sig Start Date End Date Taking? Authorizing Provider  lisinopril (ZESTRIL) 20 MG tablet Take 1 tablet (20 mg total) by mouth 2 (two) times daily. 10/03/23   Ostwalt, Edmon Crape, PA-C  simvastatin (ZOCOR) 20 MG tablet Take 1 tablet (20 mg total) by mouth at bedtime. 10/03/23   Ostwalt, Edmon Crape, PA-C    PRN MEDs: acetaminophen **OR** acetaminophen, bisacodyl, hydrALAZINE, HYDROmorphone (DILAUDID) injection, ipratropium, levalbuterol, ondansetron **OR** ondansetron (ZOFRAN) IV, oxyCODONE, senna-docusate, sodium chloride flush, sodium phosphate, traZODone  Past Medical History:  Diagnosis Date   Arthritis    Female cystocele    Hyperlipidemia    Hypertension    Hypothyroidism    Menopausal state    Obesity, unspecified    Pessary maintenance    Rectocele     Past Surgical History:  Procedure Laterality Date   BREAST EXCISIONAL BIOPSY Left 1972   benign   BREAST SURGERY     benign cyst   COLONOSCOPY     COLONOSCOPY WITH PROPOFOL N/A 11/23/2019   Procedure: COLONOSCOPY WITH PROPOFOL;  Surgeon: Toledo, Boykin Nearing, MD;  Location: [facility] ENDOSCOPY;  Service: Gastroenterology;  Laterality: N/A;   miscarrage     TUBAL LIGATION     uterine pessary       reports that she has never smoked. She has never used smokeless tobacco. She reports that she does not  currently use alcohol. She reports that she does not use drugs.   Family History  Problem Relation Age of Onset   Diabetes Mother    Seizures Mother    CVA Father    Dementia Brother    Diabetes Maternal Grandfather    Mental illness Sister    Breast cancer Neg Hx     Physical Exam:   Vitals:   10/08/23 0848 10/08/23 1015 10/08/23 1100 10/08/23 1141  BP: (!) 147/67 (!) 163/51 129/63   Pulse: 77  61 69  Resp: 18     Temp: 98.3 F (36.8 C)     TempSrc: Oral     SpO2: 100%  97% 96%  Weight:      Height:       Constitutional: NAD, calm, comfortable Eyes: PERRL, lids and  conjunctivae normal ENMT: Mucous membranes are moist. Posterior pharynx clear of any exudate or lesions.Normal dentition.  Neck: normal, supple, no masses, no thyromegaly Respiratory: clear to auscultation bilaterally, no wheezing, no crackles. Normal respiratory effort. No accessory muscle use.  Cardiovascular: Regular rate and rhythm, no murmurs / rubs / gallops. No extremity edema. 2+ pedal pulses. No carotid bruits.  Abdomen: no tenderness, no masses palpated. No hepatosplenomegaly. Bowel sounds positive.  Musculoskeletal: no clubbing / cyanosis. No joint deformity upper and lower extremities. Good ROM, no contractures. Normal muscle tone.  Neurologic: CN II-XII grossly intact. Sensation intact, DTR normal. Strength 5/5 in all 4.  Psychiatric: Normal judgment and insight. Alert and oriented x 3. Normal mood.  Skin: no rashes, lesions, ulcers. No induration       Labs on admission:    I have personally reviewed following labs and imaging studies  CBC: Recent Labs  Lab 10/04/23 0835 10/08/23 1024  WBC 9.8 9.5  NEUTROABS 6.7 7.0  HGB 6.7* 6.4*  HCT 23.5* 22.7*  MCV 69* 69.8*  PLT 371 405*   Basic Metabolic Panel: Recent Labs  Lab 10/04/23 0835 10/08/23 1024 10/08/23 1119  NA 140 138  --   K 4.7 4.3  --   CL 105 107  --   CO2 21 21*  --   GLUCOSE 99 101*  --   BUN 23 21  --   CREATININE 1.15* 1.06*  --   CALCIUM 9.7 9.4  --   MG  --   --  1.9  PHOS  --   --  3.2   GFR: Estimated Creatinine Clearance: 42 mL/min (A) (by C-G formula based on SCr of 1.06 mg/dL (H)). Liver Function Tests: Recent Labs  Lab 10/04/23 0835 10/08/23 1024  AST 7 10*  ALT 7 8  ALKPHOS 67 55  BILITOT 0.2 0.4  PROT 6.4 6.9  ALBUMIN 4.2 3.7   No results for input(s): "LIPASE", "AMYLASE" in the last 168 hours. No results for input(s): "AMMONIA" in the last 168 hours. Coagulation Profile: Recent Labs  Lab 10/08/23 1024  INR 1.0   Anemia Panel: Recent Labs    10/08/23 1119   RETICCTPCT 0.9   Urine analysis: No results found for: "COLORURINE", "APPEARANCEUR", "LABSPEC", "PHURINE", "GLUCOSEU", "HGBUR", "BILIRUBINUR", "KETONESUR", "PROTEINUR", "UROBILINOGEN", "NITRITE", "LEUKOCYTESUR"  Last A1C:  Lab Results  Component Value Date   HGBA1C 5.4 05/15/2016     Radiologic Exams on Admission:   No results found.  EKG:   Independently reviewed.  Orders placed or performed during the hospital encounter of 10/08/23   EKG 12-Lead   ---------------------------------------------------------------------------------------------------------------------------------------    Assessment / Plan:   Principal Problem:  Symptomatic anemia Active Problems:   Pure hypercholesterolemia   Hypothyroidism   Mild dementia without behavioral disturbance, psychotic disturbance, mood disturbance, or anxiety (HCC)   Essential hypertension   Assessment and Plan: * Symptomatic anemia - H&H closely -Following up with the anemia workup including labs, iron studies, B12, folate, -Per EDP-Hemoccult positive     Latest Ref Rng & Units 10/08/2023   10:24 AM 10/04/2023    8:35 AM 10/24/2022   10:28 AM  CBC  WBC 4.0 - 10.5 K/uL 9.5  9.8  9.7   Hemoglobin 12.0 - 15.0 g/dL 6.4  6.7  16.1   Hematocrit 36.0 - 46.0 % 22.7  23.5  35.8   Platelets 150 - 400 K/uL 405  371  414    -Hemoglobin confirmed 6.4, 2U PRBC to be transfused  (baseline hemoglobin 11.4 same time this year 2024) -Continue Protonix IV 40 mg twice daily  -EDP consulted GI-appreciate follow-up -Will keep the patient n.p.o.   Iron deficiency with total iron of 13 Iron/TIBC/Ferritin/ %Sat    Component Value Date/Time   IRON 13 (L) 10/04/2023 0835   TIBC 349 10/04/2023 0835   IRONPCTSAT 4 (LL) 10/04/2023 0835   -Likely will need IV iron, with p.o. iron supplements-initiated  Hypothyroidism - Not on any home medications -Checking TSH  Pure hypercholesterolemia Lipid Panel     Component Value  Date/Time   CHOL 166 10/04/2023 0835   TRIG 131 10/04/2023 0835   HDL 42 10/04/2023 0835   CHOLHDL 4.0 10/04/2023 0835   CHOLHDL 5.0 (H) 05/31/2017 0814   LDLCALC 101 (H) 10/04/2023 0835   LDLCALC 151 (H) 05/31/2017 0814   LABVLDL 23 10/04/2023 0835   - Continue statins  Mild dementia without behavioral disturbance, psychotic disturbance, mood disturbance, or anxiety (HCC) - Monitoring closely -Not on any home medications  Essential hypertension - Currently stable resuming home medication of lisinopril -   Consults called:  GI  -------------------------------------------------------------------------------------------------------------------------------------------- DVT prophylaxis:  heparin injection 5,000 Units Start: 10/08/23 1400 TED hose Start: 10/08/23 1126 SCDs Start: 10/08/23 1126   Code Status:   Code Status: Full Code   Admission status: Patient will be admitted as Observation, with a greater than 2 midnight length of stay. Level of care: Telemetry   Family Communication:  none at bedside  (The above findings and plan of care has been discussed with patient in detail, the patient expressed understanding and agreement of above plan)  --------------------------------------------------------------------------------------------------------------------------------------------------  Disposition Plan:  Anticipated 1-2 days Status is: Observation The patient remains OBS appropriate and will d/c before 2 midnights.     ----------------------------------------------------------------------------------------------------------------------------------------------------  Time spent:  32  Min.  Was spent seeing and evaluating the patient, reviewing all medical records, drawn plan of care.  SIGNED: Kendell Bane, MD, FHM. FAAFP. [facility], Pager  (Please use [email or web address] to page/ or secure chat through epic) If 7PM-7AM, please contact  night-coverage [email or web address],  10/08/2023, 11:42 AM

## 2023-10-08 NOTE — Telephone Encounter (Signed)
 FYI Please see the pt message below

## 2023-10-08 NOTE — Consult Note (Addendum)
 @LOGO @   Referring Provider: Gareth Eagle, PA-C  Primary Care Physician:  Debera Lat, PA-C Primary Gastroenterologist:  Dr. Norma Fredrickson [facility])  Date of Admission: 10/08/23 Date of Consultation: 10/08/23  Reason for Consultation:  IDA  HPI:  Stacey Fitzgerald is a 83 y.o. year old female with history of HTN, HLD, hypothyroidism, mild dementia, large 38 mm ascending colon tubular adenoma with incomplete resection in 2021, presenting today at the request of her primary care provider due to low hemoglobin on routine labs completed 10/04/2023 with hemoglobin 6.7 with iron deficiency.  ED course: Hemodynamically stable.  Lab remarkable for hemoglobin 6.4, creatinine 1.06 (stable), BUN wnl, FOBT positive.   Consult:  Reports she has been doing well overall.  States she went to have her routine physical with blood work last week and was found to have a low hemoglobin.  Reports she has not seen any blood in her stool, but has noticed having black stool for the last 2 weeks with 1 BM each morning. Thought this was secondary to her blood pressure medication.  She has also had some increased shortness of breath over the last 2 weeks which is new for her.  Denies abdominal pain, nausea, vomiting, heartburn symptoms, dysphagia, constipation, diarrhea.  She reports unintentional weight loss over the last couple of years.  States she used to weigh around 190, now weighing in the 150s.    Per chart review:  154 lbs October 03, 2023 160 lbs April 2024 164 lbs May 2023 164 lbs March 2022 165 lbs March 2021    NSAIDs: None. Takes Tylenol for arthritis.   Prior EGD: None.  Last colonoscopy 11/23/2019: Severe diverticulosis in the sigmoid colon 38 mm polyp in the ascending colon removed with hot snare, incomplete resection.  Area was tattooed.  Pathology showed tubular adenoma, negative for high-grade dysplasia and malignancy.  Past Medical History:  Diagnosis Date   Arthritis     Female cystocele    Hyperlipidemia    Hypertension    Hypothyroidism    Menopausal state    Obesity, unspecified    Pessary maintenance    Rectocele     Past Surgical History:  Procedure Laterality Date   BREAST EXCISIONAL BIOPSY Left 1972   benign   BREAST SURGERY     benign cyst   COLONOSCOPY     COLONOSCOPY WITH PROPOFOL N/A 11/23/2019   Procedure: COLONOSCOPY WITH PROPOFOL;  Surgeon: Toledo, Boykin Nearing, MD;  Location: [facility] ENDOSCOPY;  Service: Gastroenterology;  Laterality: N/A;   miscarrage     TUBAL LIGATION     uterine pessary      Prior to Admission medications   Medication Sig Start Date End Date Taking? Authorizing Provider  lisinopril (ZESTRIL) 20 MG tablet Take 1 tablet (20 mg total) by mouth 2 (two) times daily. 10/03/23   Ostwalt, Edmon Crape, PA-C  simvastatin (ZOCOR) 20 MG tablet Take 1 tablet (20 mg total) by mouth at bedtime. 10/03/23   Debera Lat, PA-C    Current Facility-Administered Medications  Medication Dose Route Frequency Provider Last Rate Last Admin   0.9 %  sodium chloride infusion (Manually program via Guardrails IV Fluids)   Intravenous Once Gareth Eagle, PA-C       Current Outpatient Medications  Medication Sig Dispense Refill   lisinopril (ZESTRIL) 20 MG tablet Take 1 tablet (20 mg total) by mouth 2 (two) times daily. 180 tablet 1   simvastatin (ZOCOR) 20 MG tablet Take 1 tablet (20 mg total)  by mouth at bedtime. 90 tablet 3    Allergies as of 10/08/2023 - Review Complete 10/08/2023  Allergen Reaction Noted   Cephalexin Hives 01/28/2015    Family History  Problem Relation Age of Onset   Diabetes Mother    Seizures Mother    CVA Father    Dementia Brother    Diabetes Maternal Grandfather    Mental illness Sister    Breast cancer Neg Hx     Social History   Socioeconomic History   Marital status: Married    Spouse name: Not on file   Number of children: 2   Years of education: Not on file   Highest education level: 12th grade   Occupational History   Occupation: retired  Tobacco Use   Smoking status: Never   Smokeless tobacco: Never  Vaping Use   Vaping status: Never Used  Substance and Sexual Activity   Alcohol use: Not Currently    Alcohol/week: 0.0 standard drinks of alcohol   Drug use: No   Sexual activity: Not on file  Other Topics Concern   Not on file  Social History Narrative   Not on file   Social Drivers of Health   Financial Resource Strain: Low Risk  (10/02/2023)   Overall Financial Resource Strain (CARDIA)    Difficulty of Paying Living Expenses: Not hard at all  Food Insecurity: No Food Insecurity (10/02/2023)   Hunger Vital Sign    Worried About Running Out of Food in the Last Year: Never true    Ran Out of Food in the Last Year: Never true  Transportation Needs: No Transportation Needs (10/02/2023)   PRAPARE - Administrator, Civil Service (Medical): No    Lack of Transportation (Non-Medical): No  Physical Activity: Inactive (10/02/2023)   Exercise Vital Sign    Days of Exercise per Week: 0 days    Minutes of Exercise per Session: 0 min  Stress: No Stress Concern Present (10/02/2023)   [employer] of Occupational Health - Occupational Stress Questionnaire    Feeling of Stress : Only a little  Social Connections: Moderately Integrated (10/02/2023)   Social Connection and Isolation Panel [NHANES]    Frequency of Communication with Friends and Family: More than three times a week    Frequency of Social Gatherings with Friends and Family: Once a week    Attends Religious Services: More than 4 times per year    Active Member of [employer] or Organizations: No    Attends Engineer, structural: More than 4 times per year    Marital Status: Widowed  Intimate Partner Violence: Not At Risk (11/20/2022)   Humiliation, Afraid, Rape, and Kick questionnaire    Fear of Current or Ex-Partner: No    Emotionally Abused: No    Physically Abused: No    Sexually Abused: No     Review of Systems: Gen: Denies fever, chills, l cold or flulike symptoms, presyncope, syncope. CV: Denies chest pain, heart palpitations. Resp: Admits to shortness of breath with activity.  No cough. GI: See HPI GU : Denies urinary burning, urinary frequency, urinary incontinence, hematuria.  MS: Denies joint pain. Derm: Denies rash. Psych: Denies depression, anxiety. Heme: See HPI  Physical Exam: Vital signs in last 24 hours: Temp:  [98.3 F (36.8 C)] 98.3 F (36.8 C) (03/18 0848) Pulse Rate:  [77] 77 (03/18 0848) Resp:  [18] 18 (03/18 0848) BP: (147)/(67) 147/67 (03/18 0848) SpO2:  [100 %] 100 % (03/18 0848) Weight:  [  72.6 kg] 72.6 kg (03/18 0847)   General:   Alert,  Well-developed, well-nourished, pleasant and cooperative in NAD Head:  Normocephalic and atraumatic. Eyes:  Sclera clear, no icterus.   Conjunctiva pink. Ears:  Normal auditory acuity. Lungs:  Clear throughout to auscultation.   No wheezes, crackles, or rhonchi. No acute distress. Heart:  Regular rate and rhythm; no murmurs, clicks, rubs,  or gallops. Abdomen:  Soft, nontender and nondistended. No masses, hepatosplenomegaly or hernias noted. Normal bowel sounds, without guarding, and without rebound.   Rectal:  Deferred  Msk:  Symmetrical without gross deformities. Normal posture. Extremities:  Without edema. Neurologic:  Alert and  oriented x4;  grossly normal neurologically. Skin:  Intact without significant lesions or rashes. Psych:  Alert and cooperative. Normal mood and affect.  Intake/Output from previous day: No intake/output data recorded. Intake/Output this shift: No intake/output data recorded.  Lab Results: Recent Labs    10/08/23 1024  WBC 9.5  HGB 6.4*  HCT 22.7*  PLT 405*   BMET Recent Labs    10/08/23 1024  NA 138  K 4.3  CL 107  CO2 21*  GLUCOSE 101*  BUN 21  CREATININE 1.06*  CALCIUM 9.4   LFT Recent Labs    10/08/23 1024  PROT 6.9  ALBUMIN 3.7  AST 10*  ALT  8  ALKPHOS 55  BILITOT 0.4   PT/INR Recent Labs    10/08/23 1024  LABPROT 13.4  INR 1.0     Impression: 83 y.o. female with history of HTN, HLD, hypothyroidism, mild dementia, large 38 mm ascending colon tubular adenoma with incomplete resection in 2021, presenting today at the request of her primary care provider due to low hemoglobin on routine labs completed 10/04/2023 with hemoglobin 6.7 with iron deficiency. Today, Hgb was 6.4 and stool heme positive. GI consulted for further evaluation.   New onset symptomatic IDA with reported melena:  Patient reports daily black stool for the last 2 weeks. No other significant GI symptoms. Reports unintentional weight loss over the last couple of years, per chart review, looks like she is down about 10 lbs. Denies NSAIDs. Hgb 6.4 on admission, and recent iron panel outpatient consistent with IDA. She has no history of anemia and denies any other overt bleeding.   In the setting of melena, this is concerning for upper GI or small bowel source, but could also be from right sided colonic lesion, specifically the known polyp that was not completely resected in 2021. At this point, recommend proceeding with EGD and colonoscopy for further evaluation.    Plan: Agree with transfusing 2 unit PRBCs as ordered by hospitalist.  Agree with IV iron as ordered today. Would hold oral iron while inpatient.  IV PPI BID Recommend EGD and colonoscopy with propofol with Dr. Jena Gauss tomorrow. The risks, benefits, and alternatives have been discussed with the patient and her granddaughter in detail. The patient states understanding and desires to proceed.  Clear liquid diet today.  NPO at midnight.  Follow-up on iron, B12, and folate levels.     LOS: 0 days    10/08/2023, 11:06 AM   Ermalinda Memos, [facility]

## 2023-10-08 NOTE — Progress Notes (Signed)
 Contact Michael please and let him know : see below

## 2023-10-08 NOTE — Plan of Care (Signed)
  Problem: Education: Goal: Knowledge of General Education information will improve Description: Including pain rating scale, medication(s)/side effects and non-pharmacologic comfort measures Outcome: Progressing   Problem: Clinical Measurements: Goal: Ability to maintain clinical measurements within normal limits will improve Outcome: Progressing Goal: Diagnostic test results will improve Outcome: Progressing   Problem: Nutrition: Goal: Adequate nutrition will be maintained Outcome: Progressing   Problem: Safety: Goal: Ability to remain free from injury will improve Outcome: Progressing   Problem: Skin Integrity: Goal: Risk for impaired skin integrity will decrease Outcome: Progressing

## 2023-10-08 NOTE — Assessment & Plan Note (Addendum)
-   H&H closely -Following up with the anemia workup including labs, iron studies, B12, folate, -Per EDP-Hemoccult positive     Latest Ref Rng & Units 10/08/2023   10:24 AM 10/04/2023    8:35 AM 10/24/2022   10:28 AM  CBC  WBC 4.0 - 10.5 K/uL 9.5  9.8  9.7   Hemoglobin 12.0 - 15.0 g/dL 6.4  6.7  57.8   Hematocrit 36.0 - 46.0 % 22.7  23.5  35.8   Platelets 150 - 400 K/uL 405  371  414    -Hemoglobin confirmed 6.4, 2U PRBC to be transfused  (baseline hemoglobin 11.4 same time this year 2024) -Continue Protonix IV 40 mg twice daily  -EDP consulted GI-appreciate follow-up -Will keep the patient n.p.o.   Iron deficiency with total iron of 13 Iron/TIBC/Ferritin/ %Sat    Component Value Date/Time   IRON 13 (L) 10/04/2023 0835   TIBC 349 10/04/2023 0835   IRONPCTSAT 4 (LL) 10/04/2023 0835   -Likely will need IV iron, with p.o. iron supplements-initiated

## 2023-10-08 NOTE — ED Provider Notes (Signed)
 [place] EMERGENCY DEPARTMENT AT [facility] Provider Note   CSN: [id number] Arrival date & time: 10/08/23  1914     History  No chief complaint on file.  HPI Stacey Fitzgerald is a 83 y.o. female with history of hypertension and hypothyroidism, hyperlipidemia presenting for anemia.  States she was sent by her PCP for low hemoglobin level.  She also reports that she has been feeling "very sleepy" in the last week and is short of breath.  States she only can take a few steps before she needs to sit down.  She denies chest pain.  Denies bloody stools, hematuria, hemoptysis or hematemesis.  Denies use of a blood thinner.  HPI     Home Medications Prior to Admission medications   Medication Sig Start Date End Date Taking? Authorizing Provider  lisinopril (ZESTRIL) 20 MG tablet Take 1 tablet (20 mg total) by mouth 2 (two) times daily. 10/03/23   Ostwalt, Edmon Crape, PA-C  simvastatin (ZOCOR) 20 MG tablet Take 1 tablet (20 mg total) by mouth at bedtime. 10/03/23   Debera Lat, PA-C      Allergies    Cephalexin    Review of Systems   See HPI  Physical Exam Updated Vital Signs BP 129/63   Pulse 61   Temp 98.3 F (36.8 C) (Oral)   Resp 18   Ht 5\' 8"  (1.727 m)   Wt 72.6 kg   SpO2 97%   BMI 24.33 kg/m  Physical Exam Vitals and nursing note reviewed.  HENT:     Head: Normocephalic and atraumatic.     Mouth/Throat:     Mouth: Mucous membranes are moist.  Eyes:     General:        Right eye: No discharge.        Left eye: No discharge.     Conjunctiva/sclera: Conjunctivae normal.  Cardiovascular:     Rate and Rhythm: Normal rate and regular rhythm.     Pulses: Normal pulses.     Heart sounds: Normal heart sounds.  Pulmonary:     Effort: Pulmonary effort is normal.     Breath sounds: Normal breath sounds.  Abdominal:     General: Abdomen is flat.     Palpations: Abdomen is soft.  Skin:    General: Skin is warm and dry.  Neurological:     General: No focal  deficit present.  Psychiatric:        Mood and Affect: Mood normal.     ED Results / Procedures / Treatments   Labs (all labs ordered are listed, but only abnormal results are displayed) Labs Reviewed  COMPREHENSIVE METABOLIC PANEL - Abnormal; Notable for the following components:      Result Value   CO2 21 (*)    Glucose, Bld 101 (*)    Creatinine, Ser 1.06 (*)    AST 10 (*)    GFR, Estimated 53 (*)    All other components within normal limits  CBC WITH DIFFERENTIAL/PLATELET - Abnormal; Notable for the following components:   RBC 3.25 (*)    Hemoglobin 6.4 (*)    HCT 22.7 (*)    MCV 69.8 (*)    MCH 19.7 (*)    MCHC 28.2 (*)    RDW 16.0 (*)    Platelets 405 (*)    All other components within normal limits  RETICULOCYTES - Abnormal; Notable for the following components:   RBC. 3.00 (*)    All other components within normal  limits  EXPECTORATED SPUTUM ASSESSMENT W GRAM STAIN, RFLX TO RESP C  PROTIME-INR  VITAMIN B12  FOLATE  IRON AND TIBC  FERRITIN  MAGNESIUM  PHOSPHORUS  POC OCCULT BLOOD, ED  TYPE AND SCREEN  PREPARE RBC (CROSSMATCH)  ABO/RH    EKG None  Radiology No results found.  Procedures .Critical Care  Performed by: Gareth Eagle, PA-C Authorized by: Gareth Eagle, PA-C   Critical care provider statement:    Critical care time (minutes):  30   Critical care was necessary to treat or prevent imminent or life-threatening deterioration of the following conditions: Symptomatic anemia requiring blood transfusion.   Critical care was time spent personally by me on the following activities:  Development of treatment plan with patient or surrogate, discussions with consultants, evaluation of patient's response to treatment, examination of patient, ordering and review of laboratory studies, ordering and review of radiographic studies, ordering and performing treatments and interventions, pulse oximetry, re-evaluation of patient's condition and review of  old charts     Medications Ordered in ED Medications  0.9 %  sodium chloride infusion (Manually program via Guardrails IV Fluids) (has no administration in time range)  heparin injection 5,000 Units (has no administration in time range)  sodium chloride flush (NS) 0.9 % injection 3 mL (has no administration in time range)  acetaminophen (TYLENOL) tablet 650 mg (has no administration in time range)    Or  acetaminophen (TYLENOL) suppository 650 mg (has no administration in time range)  oxyCODONE (Oxy IR/ROXICODONE) immediate release tablet 5 mg (has no administration in time range)  HYDROmorphone (DILAUDID) injection 0.5-1 mg (has no administration in time range)  traZODone (DESYREL) tablet 25 mg (has no administration in time range)  senna-docusate (Senokot-S) tablet 1 tablet (has no administration in time range)  bisacodyl (DULCOLAX) EC tablet 5 mg (has no administration in time range)  sodium phosphate (FLEET) enema 1 enema (has no administration in time range)  ondansetron (ZOFRAN) tablet 4 mg (has no administration in time range)    Or  ondansetron (ZOFRAN) injection 4 mg (has no administration in time range)  ipratropium (ATROVENT) nebulizer solution 0.5 mg (has no administration in time range)  levalbuterol (XOPENEX) nebulizer solution 0.63 mg (has no administration in time range)  hydrALAZINE (APRESOLINE) injection 10 mg (has no administration in time range)  sodium chloride flush (NS) 0.9 % injection 3-10 mL (has no administration in time range)  pantoprazole (PROTONIX) injection 40 mg (has no administration in time range)  lisinopril (ZESTRIL) tablet 20 mg (has no administration in time range)  simvastatin (ZOCOR) tablet 20 mg (has no administration in time range)    ED Course/ Medical Decision Making/ A&P                                 Medical Decision Making Amount and/or Complexity of Data Reviewed Labs: ordered.  Risk Prescription drug management. Decision  regarding hospitalization.   Initial Impression and Ddx 83 year old well-appearing female presenting for anemia.  Exam was unremarkable.  DDx includes GI bleed, symptomatic anemia, AKI, bleeding from another source, other. Patient PMH that increases complexity of ED encounter:   hypertension and hypothyroidism, hyperlipidemia  Interpretation of Diagnostics - I independent reviewed and interpreted the labs as followed: Anemia (6.4), Hemoccult positive  Patient Reassessment and Ultimate Disposition/Management Workup suggestive of symptomatic anemia secondary to possible GI bleed.  Ordered 2 units of blood and PPI bolus.  Admitted to hospital service with Dr. Flossie Dibble.  At this time she remains clinically well, no acute distress and hemodynamically stable.  Patient management required discussion with the following services or consulting groups:  Hospitalist Service  Complexity of Problems Addressed Acute complicated illness or Injury  Additional Data Reviewed and Analyzed Further history obtained from: Further history from spouse/family member, Past medical history and medications listed in the EMR, and Prior ED visit notes  Patient Encounter Risk Assessment Consideration of hospitalization         Final Clinical Impression(s) / ED Diagnoses Final diagnoses:  Symptomatic anemia    Rx / DC Orders ED Discharge Orders     None         Gareth Eagle, PA-C 10/08/23 1130    Cathren Laine, MD 10/09/23 2115

## 2023-10-08 NOTE — Care Management Obs Status (Signed)
 MEDICARE OBSERVATION STATUS NOTIFICATION   Patient Details  Name: Stacey Fitzgerald MRN: [id number] Date of Birth: May 28, 1941   Medicare Observation Status Notification Given:  Yes    Barron Alvine, RN 10/08/2023, 7:11 PM

## 2023-10-08 NOTE — TOC CM/SW Note (Signed)
 Transition of Care [facility]) - Inpatient Brief Assessment   Patient Details  Name: Stacey Fitzgerald MRN: [id number] Date of Birth: [birth year]  Transition of Care [facility]) CM/SW Contact:    Villa Herb, LCSWA Phone Number: 10/08/2023, 12:49 PM   Clinical Narrative: Transition of Care Department [facility]) has reviewed patient and no TOC needs have been identified at this time. We will continue to monitor patient advancement through interdiciplinary progression rounds. If new patient transition needs arise, please place a TOC consult.   Transition of Care Asessment: Insurance and Status: Insurance coverage has been reviewed Patient has primary care physician: Yes Home environment has been reviewed: From home Prior level of function:: Independent Prior/Current Home Services: No current home services Social Drivers of Health Review: SDOH reviewed no interventions necessary Readmission risk has been reviewed: Yes Transition of care needs: no transition of care needs at this time

## 2023-10-08 NOTE — Assessment & Plan Note (Signed)
-   Monitoring closely -Not on any home medications

## 2023-10-08 NOTE — H&P (View-Only) (Signed)
 @LOGO @   Referring Provider: Gareth Eagle, PA-C  Primary Care Physician:  Debera Lat, PA-C Primary Gastroenterologist:  Dr. Norma Fredrickson [facility])  Date of Admission: 10/08/23 Date of Consultation: 10/08/23  Reason for Consultation:  IDA  HPI:  Stacey Fitzgerald is a 83 y.o. year old female with history of HTN, HLD, hypothyroidism, mild dementia, large 38 mm ascending colon tubular adenoma with incomplete resection in 2021, presenting today at the request of her primary care provider due to low hemoglobin on routine labs completed 10/04/2023 with hemoglobin 6.7 with iron deficiency.  ED course: Hemodynamically stable.  Lab remarkable for hemoglobin 6.4, creatinine 1.06 (stable), BUN wnl, FOBT positive.   Consult:  Reports she has been doing well overall.  States she went to have her routine physical with blood work last week and was found to have a low hemoglobin.  Reports she has not seen any blood in her stool, but has noticed having black stool for the last 2 weeks with 1 BM each morning. Thought this was secondary to her blood pressure medication.  She has also had some increased shortness of breath over the last 2 weeks which is new for her.  Denies abdominal pain, nausea, vomiting, heartburn symptoms, dysphagia, constipation, diarrhea.  She reports unintentional weight loss over the last couple of years.  States she used to weigh around 190, now weighing in the 150s.    Per chart review:  154 lbs October 03, 2023 160 lbs April 2024 164 lbs May 2023 164 lbs March 2022 165 lbs March 2021    NSAIDs: None. Takes Tylenol for arthritis.   Prior EGD: None.  Last colonoscopy 11/23/2019: Severe diverticulosis in the sigmoid colon 38 mm polyp in the ascending colon removed with hot snare, incomplete resection.  Area was tattooed.  Pathology showed tubular adenoma, negative for high-grade dysplasia and malignancy.  Past Medical History:  Diagnosis Date   Arthritis     Female cystocele    Hyperlipidemia    Hypertension    Hypothyroidism    Menopausal state    Obesity, unspecified    Pessary maintenance    Rectocele     Past Surgical History:  Procedure Laterality Date   BREAST EXCISIONAL BIOPSY Left 1972   benign   BREAST SURGERY     benign cyst   COLONOSCOPY     COLONOSCOPY WITH PROPOFOL N/A 11/23/2019   Procedure: COLONOSCOPY WITH PROPOFOL;  Surgeon: Toledo, Boykin Nearing, MD;  Location: [facility] ENDOSCOPY;  Service: Gastroenterology;  Laterality: N/A;   miscarrage     TUBAL LIGATION     uterine pessary      Prior to Admission medications   Medication Sig Start Date End Date Taking? Authorizing Provider  lisinopril (ZESTRIL) 20 MG tablet Take 1 tablet (20 mg total) by mouth 2 (two) times daily. 10/03/23   Ostwalt, Edmon Crape, PA-C  simvastatin (ZOCOR) 20 MG tablet Take 1 tablet (20 mg total) by mouth at bedtime. 10/03/23   Debera Lat, PA-C    Current Facility-Administered Medications  Medication Dose Route Frequency Provider Last Rate Last Admin   0.9 %  sodium chloride infusion (Manually program via Guardrails IV Fluids)   Intravenous Once Gareth Eagle, PA-C       Current Outpatient Medications  Medication Sig Dispense Refill   lisinopril (ZESTRIL) 20 MG tablet Take 1 tablet (20 mg total) by mouth 2 (two) times daily. 180 tablet 1   simvastatin (ZOCOR) 20 MG tablet Take 1 tablet (20 mg total)  by mouth at bedtime. 90 tablet 3    Allergies as of 10/08/2023 - Review Complete 10/08/2023  Allergen Reaction Noted   Cephalexin Hives 01/28/2015    Family History  Problem Relation Age of Onset   Diabetes Mother    Seizures Mother    CVA Father    Dementia Brother    Diabetes Maternal Grandfather    Mental illness Sister    Breast cancer Neg Hx     Social History   Socioeconomic History   Marital status: Married    Spouse name: Not on file   Number of children: 2   Years of education: Not on file   Highest education level: 12th grade   Occupational History   Occupation: retired  Tobacco Use   Smoking status: Never   Smokeless tobacco: Never  Vaping Use   Vaping status: Never Used  Substance and Sexual Activity   Alcohol use: Not Currently    Alcohol/week: 0.0 standard drinks of alcohol   Drug use: No   Sexual activity: Not on file  Other Topics Concern   Not on file  Social History Narrative   Not on file   Social Drivers of Health   Financial Resource Strain: Low Risk  (10/02/2023)   Overall Financial Resource Strain (CARDIA)    Difficulty of Paying Living Expenses: Not hard at all  Food Insecurity: No Food Insecurity (10/02/2023)   Hunger Vital Sign    Worried About Running Out of Food in the Last Year: Never true    Ran Out of Food in the Last Year: Never true  Transportation Needs: No Transportation Needs (10/02/2023)   PRAPARE - Administrator, Civil Service (Medical): No    Lack of Transportation (Non-Medical): No  Physical Activity: Inactive (10/02/2023)   Exercise Vital Sign    Days of Exercise per Week: 0 days    Minutes of Exercise per Session: 0 min  Stress: No Stress Concern Present (10/02/2023)   [employer] of Occupational Health - Occupational Stress Questionnaire    Feeling of Stress : Only a little  Social Connections: Moderately Integrated (10/02/2023)   Social Connection and Isolation Panel [NHANES]    Frequency of Communication with Friends and Family: More than three times a week    Frequency of Social Gatherings with Friends and Family: Once a week    Attends Religious Services: More than 4 times per year    Active Member of [employer] or Organizations: No    Attends Engineer, structural: More than 4 times per year    Marital Status: Widowed  Intimate Partner Violence: Not At Risk (11/20/2022)   Humiliation, Afraid, Rape, and Kick questionnaire    Fear of Current or Ex-Partner: No    Emotionally Abused: No    Physically Abused: No    Sexually Abused: No     Review of Systems: Gen: Denies fever, chills, l cold or flulike symptoms, presyncope, syncope. CV: Denies chest pain, heart palpitations. Resp: Admits to shortness of breath with activity.  No cough. GI: See HPI GU : Denies urinary burning, urinary frequency, urinary incontinence, hematuria.  MS: Denies joint pain. Derm: Denies rash. Psych: Denies depression, anxiety. Heme: See HPI  Physical Exam: Vital signs in last 24 hours: Temp:  [98.3 F (36.8 C)] 98.3 F (36.8 C) (03/18 0848) Pulse Rate:  [77] 77 (03/18 0848) Resp:  [18] 18 (03/18 0848) BP: (147)/(67) 147/67 (03/18 0848) SpO2:  [100 %] 100 % (03/18 0848) Weight:  [  72.6 kg] 72.6 kg (03/18 0847)   General:   Alert,  Well-developed, well-nourished, pleasant and cooperative in NAD Head:  Normocephalic and atraumatic. Eyes:  Sclera clear, no icterus.   Conjunctiva pink. Ears:  Normal auditory acuity. Lungs:  Clear throughout to auscultation.   No wheezes, crackles, or rhonchi. No acute distress. Heart:  Regular rate and rhythm; no murmurs, clicks, rubs,  or gallops. Abdomen:  Soft, nontender and nondistended. No masses, hepatosplenomegaly or hernias noted. Normal bowel sounds, without guarding, and without rebound.   Rectal:  Deferred  Msk:  Symmetrical without gross deformities. Normal posture. Extremities:  Without edema. Neurologic:  Alert and  oriented x4;  grossly normal neurologically. Skin:  Intact without significant lesions or rashes. Psych:  Alert and cooperative. Normal mood and affect.  Intake/Output from previous day: No intake/output data recorded. Intake/Output this shift: No intake/output data recorded.  Lab Results: Recent Labs    10/08/23 1024  WBC 9.5  HGB 6.4*  HCT 22.7*  PLT 405*   BMET Recent Labs    10/08/23 1024  NA 138  K 4.3  CL 107  CO2 21*  GLUCOSE 101*  BUN 21  CREATININE 1.06*  CALCIUM 9.4   LFT Recent Labs    10/08/23 1024  PROT 6.9  ALBUMIN 3.7  AST 10*  ALT  8  ALKPHOS 55  BILITOT 0.4   PT/INR Recent Labs    10/08/23 1024  LABPROT 13.4  INR 1.0     Impression: 83 y.o. female with history of HTN, HLD, hypothyroidism, mild dementia, large 38 mm ascending colon tubular adenoma with incomplete resection in 2021, presenting today at the request of her primary care provider due to low hemoglobin on routine labs completed 10/04/2023 with hemoglobin 6.7 with iron deficiency. Today, Hgb was 6.4 and stool heme positive. GI consulted for further evaluation.   New onset symptomatic IDA with reported melena:  Patient reports daily black stool for the last 2 weeks. No other significant GI symptoms. Reports unintentional weight loss over the last couple of years, per chart review, looks like she is down about 10 lbs. Denies NSAIDs. Hgb 6.4 on admission, and recent iron panel outpatient consistent with IDA. She has no history of anemia and denies any other overt bleeding.   In the setting of melena, this is concerning for upper GI or small bowel source, but could also be from right sided colonic lesion, specifically the known polyp that was not completely resected in 2021. At this point, recommend proceeding with EGD and colonoscopy for further evaluation.    Plan: Agree with transfusing 2 unit PRBCs as ordered by hospitalist.  Agree with IV iron as ordered today. Would hold oral iron while inpatient.  IV PPI BID Recommend EGD and colonoscopy with propofol with Dr. Jena Gauss tomorrow. The risks, benefits, and alternatives have been discussed with the patient and her granddaughter in detail. The patient states understanding and desires to proceed.  Clear liquid diet today.  NPO at midnight.  Follow-up on iron, B12, and folate levels.     LOS: 0 days    10/08/2023, 11:06 AM   Ermalinda Memos, [facility]astroenter[facility]

## 2023-10-08 NOTE — Assessment & Plan Note (Signed)
-   Currently stable resuming home medication of lisinopril -

## 2023-10-08 NOTE — ED Triage Notes (Signed)
 Pt was told to come to the ED from her PCP due to "low hemoglobin level and severe anemia". No hx of blood transfusions. Pt reports some weakness and SOB with exertion.

## 2023-10-08 NOTE — Hospital Course (Signed)
 Stacey Fitzgerald is a 56 yof with PMH/o; hypertension, hyperlipidemia, memory deficit,?  Hypothyroidism... presenting for anemia. Denies any chest pain or shortness of breath, complaining of generalized weakness.,  "feeling drowsy, sleepy", generalized weakness. Denied having any dark, bloody stool, denies any bloody urine or vomiting blood.,  Not chronically on blood thinners. Sent in from PCP for low hemoglobin level.  ED evaluation/course: Blood pressure 129/63, pulse 61, temperature 98.3 F (36.8 C), temperature source Oral, resp. rate 18, height 5\' 8"  (1.727 m), weight 72.6 kg, SpO2 97%.  LABs; CBC; Hgb 6.4, platelets 405, BUN 21, creatinine 1.06, glucose 101 Iron 13, iron saturation 4, Hemoccult positive  EDP consulted gastroenterologist, transfusing 2U PRBC, initiating IV PPI  Requested to be admitted to medical anemia, GI bleed

## 2023-10-09 ENCOUNTER — Observation Stay (HOSPITAL_COMMUNITY): Admitting: Anesthesiology

## 2023-10-09 ENCOUNTER — Encounter (HOSPITAL_COMMUNITY)
Admission: EM | Disposition: A | Discharge status: Skilled Nursing Facility | Payer: Self-pay | Source: Home / Self Care | Attending: Internal Medicine

## 2023-10-09 ENCOUNTER — Encounter (HOSPITAL_COMMUNITY): Payer: Self-pay | Admitting: Family Medicine

## 2023-10-09 DIAGNOSIS — K449 Diaphragmatic hernia without obstruction or gangrene: Secondary | ICD-10-CM | POA: Diagnosis not present

## 2023-10-09 DIAGNOSIS — K222 Esophageal obstruction: Secondary | ICD-10-CM | POA: Diagnosis not present

## 2023-10-09 DIAGNOSIS — D649 Anemia, unspecified: Secondary | ICD-10-CM | POA: Diagnosis not present

## 2023-10-09 DIAGNOSIS — K641 Second degree hemorrhoids: Secondary | ICD-10-CM | POA: Diagnosis not present

## 2023-10-09 DIAGNOSIS — I10 Essential (primary) hypertension: Secondary | ICD-10-CM

## 2023-10-09 DIAGNOSIS — K921 Melena: Secondary | ICD-10-CM | POA: Diagnosis not present

## 2023-10-09 DIAGNOSIS — K5730 Diverticulosis of large intestine without perforation or abscess without bleeding: Secondary | ICD-10-CM | POA: Diagnosis not present

## 2023-10-09 DIAGNOSIS — D509 Iron deficiency anemia, unspecified: Secondary | ICD-10-CM

## 2023-10-09 DIAGNOSIS — D500 Iron deficiency anemia secondary to blood loss (chronic): Secondary | ICD-10-CM

## 2023-10-09 DIAGNOSIS — C182 Malignant neoplasm of ascending colon: Secondary | ICD-10-CM | POA: Diagnosis not present

## 2023-10-09 HISTORY — PX: ESOPHAGOGASTRODUODENOSCOPY: SHX5428

## 2023-10-09 HISTORY — PX: COLONOSCOPY: SHX5424

## 2023-10-09 LAB — APTT: aPTT: 28 s (ref 24–36)

## 2023-10-09 LAB — BASIC METABOLIC PANEL
Anion gap: 8 (ref 5–15)
BUN: 17 mg/dL (ref 8–23)
CO2: 22 mmol/L (ref 22–32)
Calcium: 9.4 mg/dL (ref 8.9–10.3)
Chloride: 116 mmol/L — ABNORMAL HIGH (ref 98–111)
Creatinine, Ser: 0.96 mg/dL (ref 0.44–1.00)
GFR, Estimated: 59 mL/min — ABNORMAL LOW (ref >60)
Glucose, Bld: 107 mg/dL — ABNORMAL HIGH (ref 70–99)
Potassium: 4 mmol/L (ref 3.5–5.1)
Sodium: 146 mmol/L — ABNORMAL HIGH (ref 135–145)

## 2023-10-09 LAB — CBC
HCT: 32.3 % — ABNORMAL LOW (ref 36.0–46.0)
Hemoglobin: 9.4 g/dL — ABNORMAL LOW (ref 12.0–15.0)
MCH: 21.5 pg — ABNORMAL LOW (ref 26.0–34.0)
MCHC: 29.1 g/dL — ABNORMAL LOW (ref 30.0–36.0)
MCV: 73.9 fL — ABNORMAL LOW (ref 80.0–100.0)
Platelets: 436 10*3/uL — ABNORMAL HIGH (ref 150–400)
RBC: 4.37 MIL/uL (ref 3.87–5.11)
RDW: 17.8 % — ABNORMAL HIGH (ref 11.5–15.5)
WBC: 10.1 10*3/uL (ref 4.0–10.5)
nRBC: 0 % (ref 0.0–0.2)

## 2023-10-09 LAB — TYPE AND SCREEN
ABO/RH(D): B NEG
Antibody Screen: NEGATIVE
Unit division: 0
Unit division: 0

## 2023-10-09 LAB — PROTIME-INR
INR: 1.1 (ref 0.8–1.2)
Prothrombin Time: 14.1 s (ref 11.4–15.2)

## 2023-10-09 LAB — BPAM RBC
Blood Product Expiration Date: 202504062359
Blood Product Expiration Date: 202504082359
ISSUE DATE / TIME: 202503181154
ISSUE DATE / TIME: 202503181537
Unit Type and Rh: 1700
Unit Type and Rh: 9500

## 2023-10-09 LAB — GLUCOSE, CAPILLARY: Glucose-Capillary: 97 mg/dL (ref 70–99)

## 2023-10-09 SURGERY — EGD (ESOPHAGOGASTRODUODENOSCOPY)
Anesthesia: General

## 2023-10-09 MED ORDER — SODIUM CHLORIDE 0.9% FLUSH
3.0000 mL | Freq: Two times a day (BID) | INTRAVENOUS | Status: DC
  Administered 2023-10-10: 10 mL via INTRAVENOUS

## 2023-10-09 MED ORDER — PROPOFOL 500 MG/50ML IV EMUL
INTRAVENOUS | Status: DC | PRN
  Administered 2023-10-09: 150 ug/kg/min via INTRAVENOUS

## 2023-10-09 MED ORDER — LACTATED RINGERS IV SOLN: INTRAVENOUS | Status: DC | PRN

## 2023-10-09 MED ORDER — SODIUM CHLORIDE 0.9 % IV SOLN
250.0000 mg | Freq: Once | INTRAVENOUS | Status: DC
  Filled 2023-10-09: qty 20

## 2023-10-09 MED ORDER — STERILE WATER FOR IRRIGATION IR SOLN
Status: DC | PRN
  Administered 2023-10-09: 120 mL

## 2023-10-09 MED ORDER — LIDOCAINE HCL (CARDIAC) PF 100 MG/5ML IV SOSY
PREFILLED_SYRINGE | INTRAVENOUS | Status: DC | PRN
  Administered 2023-10-09: 50 mg via INTRATRACHEAL

## 2023-10-09 MED ORDER — SODIUM CHLORIDE 0.9% FLUSH: 3.0000 mL | INTRAVENOUS | Status: DC | PRN

## 2023-10-09 MED ORDER — SPOT INK MARKER SYRINGE KIT
PACK | SUBMUCOSAL | Status: DC | PRN
  Administered 2023-10-09: 3 mL via SUBMUCOSAL

## 2023-10-09 MED ORDER — FERROUS SULFATE 325 (65 FE) MG PO TABS
325.0000 mg | ORAL_TABLET | Freq: Two times a day (BID) | ORAL | Status: DC
  Administered 2023-10-09 – 2023-10-17 (×15): 325 mg via ORAL
  Filled 2023-10-09 (×16): qty 1

## 2023-10-09 NOTE — Progress Notes (Signed)
   10/09/23 1657  Vitals  Temp 98 F (36.7 C)  Temp Source Oral  BP 127/62  MAP (mmHg) 80  BP Location Left Arm  BP Method Automatic  Patient Position (if appropriate) Lying  Pulse Rate 72  Pulse Rate Source Dinamap  Resp 16  Level of Consciousness  Level of Consciousness Alert  MEWS COLOR  MEWS Score Color Green  Oxygen Therapy  SpO2 96 %  O2 Device Room Air  Pain Assessment  Pain Scale 0-10  Pain Score 0  MEWS Score  MEWS Temp 0  MEWS Systolic 0  MEWS Pulse 0  MEWS RR 0  MEWS LOC 0  MEWS Score 0   Pt arrived to room 329 from endo. Pt A&O, denies any c/o pain or n/v. VSS.  Son at bedside. Bed alarm on for safety, clear liquids given per order and pt request. Advised to call for needs. States understanding.

## 2023-10-09 NOTE — Op Note (Addendum)
 [facility] Patient Name: Stacey Fitzgerald Procedure Date: 10/09/2023 3:15 PM MRN: [id number] Date of Birth: Feb 04, 1941 Attending MD: Gennette Pac , MD, [id number] CSN: [id number] Age: 83 Admit Type: Inpatient Procedure:                Upper GI endoscopy Indications:              Iron deficiency anemia secondary to chronic blood                            loss, Melena Providers:                Gennette Pac, MD, Angelica Ran, Crystal Page Referring MD:              Medicines:                Propofol per Anesthesia Complications:            No immediate complications. Estimated Blood Loss:     Estimated blood loss: none. Procedure:                Pre-Anesthesia Assessment:                           - Prior to the procedure, a History and Physical                            was performed, and patient medications and                            allergies were reviewed. The patient's tolerance of                            previous anesthesia was also reviewed. The risks                            and benefits of the procedure and the sedation                            options and risks were discussed with the patient.                            All questions were answered, and informed consent                            was obtained. Prior Anticoagulants: The patient has                            taken no anticoagulant or antiplatelet agents. ASA                            Grade Assessment: III - A patient with severe                            systemic disease. After reviewing the risks and  benefits, the patient was deemed in satisfactory                            condition to undergo the procedure.                           After obtaining informed consent, the endoscope was                            passed under direct vision. Throughout the                            procedure, the patient's blood pressure, pulse, and                             oxygen saturations were monitored continuously. The                            [id number]-[id number] ([id number]) scope was introduced through the                            mouth, and advanced to the second part of duodenum.                            The upper GI endoscopy was accomplished without                            difficulty. The patient tolerated the procedure                            well. Scope In: 3:43:23 PM Scope Out: 3:47:54 PM Total Procedure Duration: 0 hours 4 minutes 31 seconds  Findings:      A non-obstructing Schatzki ring was found at the gastroesophageal       junction. Esophageal mucosa otherwise appeared normal. Stomach empty.      A medium-sized hiatal hernia was present (6 cm). Gastric mucosa appeared       normal. Patent pylorus.      The duodenal bulb and second portion of the duodenum were normal. Impression:               - Non-obstructing Schatzki ring. Not manipulated                            (no dysphagia)                           - Medium-sized hiatal hernia.                           - Normal duodenal bulb and second portion of the                            duodenum.                           - No specimens collected. Moderate  Sedation:      Moderate (conscious) sedation was personally administered by an       anesthesia professional. The following parameters were monitored: oxygen       saturation, heart rate, blood pressure, respiratory rate, EKG, adequacy       of pulmonary ventilation, and response to care. Recommendation:           - Return patient to hospital ward for ongoing care.                           - Clear liquid diet.                           - Continue present medications. See colonoscopy                            report.                           - Return to my office (date not yet determined). Procedure Code(s):        --- Professional ---                           [phone], Esophagogastroduodenoscopy, flexible,                             transoral; diagnostic, including collection of                            specimen(s) by brushing or washing, when performed                            (separate procedure) Diagnosis Code(s):        --- Professional ---                           K22.2, Esophageal obstruction                           K44.9, Diaphragmatic hernia without obstruction or                            gangrene                           D50.0, Iron deficiency anemia secondary to blood                            loss (chronic)                           K92.1, Melena (includes Hematochezia) CPT copyright 2022 American [employer]. All rights reserved. The codes documented in this report are preliminary and upon coder review may  be revised to meet current compliance requirements. Gerrit Friends. Mishael Krysiak, MD Gennette Pac, MD 10/09/2023 3:51:55 PM This report has been signed electronically. Number of Addenda: 0

## 2023-10-09 NOTE — Anesthesia Preprocedure Evaluation (Signed)
 Anesthesia Evaluation  Patient identified by MRN, date of birth, ID band Patient awake    Reviewed: Allergy & Precautions, H&P , NPO status , Patient's Chart, lab work & pertinent test results, reviewed documented beta blocker date and time   Airway Mallampati: II  TM Distance: >3 FB Neck ROM: full    Dental  (+) Dental Advisory Given, Missing,    Pulmonary neg pulmonary ROS   Pulmonary exam normal breath sounds clear to auscultation       Cardiovascular Exercise Tolerance: Good hypertension, Normal cardiovascular exam Rhythm:regular Rate:Normal     Neuro/Psych  PSYCHIATRIC DISORDERS     Dementia negative neurological ROS     GI/Hepatic negative GI ROS, Neg liver ROS,,,  Endo/Other  Hypothyroidism    Renal/GU negative Renal ROS  negative genitourinary   Musculoskeletal  (+) Arthritis , Osteoarthritis,    Abdominal   Peds  Hematology  (+) Blood dyscrasia, anemia Hgb 9.4   Anesthesia Other Findings   Reproductive/Obstetrics negative OB ROS                             Anesthesia Physical Anesthesia Plan  ASA: 3  Anesthesia Plan: General   Post-op Pain Management: Minimal or no pain anticipated   Induction: Intravenous  PONV Risk Score and Plan: Propofol infusion  Airway Management Planned: Nasal Cannula and Natural Airway  Additional Equipment: None  Intra-op Plan:   Post-operative Plan:   Informed Consent: I have reviewed the patients History and Physical, chart, labs and discussed the procedure including the risks, benefits and alternatives for the proposed anesthesia with the patient or authorized representative who has indicated his/her understanding and acceptance.     Dental Advisory Given  Plan Discussed with: CRNA  Anesthesia Plan Comments:         Anesthesia Quick Evaluation

## 2023-10-09 NOTE — Op Note (Addendum)
 [facility] Patient Name: Stacey Fitzgerald Procedure Date: 10/09/2023 3:14 PM MRN: [id number] Date of Birth: 05-17-1941 Attending MD: Gennette Pac , MD, [id number] CSN: [id number] Age: 83 Admit Type: Inpatient Procedure:                Colonoscopy Indications:              Heme positive stool, Iron deficiency anemia                            secondary to chronic blood loss Providers:                Gennette Pac, MD, Angelica Ran, Crystal Page Referring MD:              Medicines:                Propofol per Anesthesia Complications:            No immediate complications. Estimated Blood Loss:     Estimated blood loss was minimal. Estimated blood                            loss was minimal. Procedure:                Pre-Anesthesia Assessment:                           - Prior to the procedure, a History and Physical                            was performed, and patient medications and                            allergies were reviewed. The patient's tolerance of                            previous anesthesia was also reviewed. The risks                            and benefits of the procedure and the sedation                            options and risks were discussed with the patient.                            All questions were answered, and informed consent                            was obtained. Prior Anticoagulants: The patient has                            taken no anticoagulant or antiplatelet agents. ASA                            Grade Assessment: III - A patient with severe  systemic disease. After reviewing the risks and                            benefits, the patient was deemed in satisfactory                            condition to undergo the procedure.                           After obtaining informed consent, the colonoscope                            was passed under direct vision. Throughout the                             procedure, the patient's blood pressure, pulse, and                            oxygen saturations were monitored continuously. The                            [id number]) scope was introduced through the                            anus and advanced to the the cecum, identified by                            appendiceal orifice and ileocecal valve. The                            colonoscopy was performed without difficulty. The                            patient tolerated the procedure well. The quality                            of the bowel preparation was adequate. The                            ileocecal valve, appendiceal orifice, and rectum                            were photographed. The entire colon was well                            visualized. Scope In: 3:52:58 PM Scope Out: 4:11:42 PM Scope Withdrawal Time: 0 hours 9 minutes 18 seconds  Total Procedure Duration: 0 hours 18 minutes 44 seconds  Findings:      The perianal and digital rectal examinations were normal.      Non-bleeding internal hemorrhoids were found during retroflexion. The       hemorrhoids were moderate, medium-sized and Grade II (internal       hemorrhoids that prolapse but reduce spontaneously).      Scattered medium-mouthed diverticula were found in the sigmoid colon  and       descending colon.      In the proximal ascending segment, there was a spiral shaped       semilunar/semiapple core neoplasm approximately 10 to 15 cm distal to       the ileocecal valve. Please see photos. There was evidence of a ink mark       at the level of this tumor.      With a jumbo biopsy forceps, I biopsied this lesion multiple times and I       pulled back 5 cm distal to it in and placed large tattoo marks on       opposite walls of the colon. See photos. These would be the important       tattoo marks to be included in the resected specimen to ensure all the       tumors removed.      The exam was otherwise  without abnormality on direct and retroflexion       views. Impression:               - Non-bleeding internal hemorrhoids.                           - Diverticulosis in the sigmoid colon and in the                            descending colon.                           -Colon cancer - ascending segment. Status post                            biopsy and inking as described above Moderate Sedation:      Moderate (conscious) sedation was personally administered by an       anesthesia professional. The following parameters were monitored: oxygen       saturation, heart rate, blood pressure, respiratory rate, EKG, adequacy       of pulmonary ventilation, and response to care. Recommendation:           - Patient has a contact number available for                            emergencies. The signs and symptoms of potential                            delayed complications were discussed with the                            patient. - Clear liquid diet.                           - Continue present medications. Surgical                            consultation recommended. I have discussed my                            findings and recommendations with the patient's son  Domenick Gong at [phone]. His questions were                            answered.                           - Repeat colonoscopy date to be determined after                            pending pathology results are reviewed for                            surveillance.                           - Return patient to hospital ward for ongoing care.                            See EGD report. Findings also communicated to the                            hospitalist electronically. Procedure Code(s):        --- Professional ---                           [phone], Colonoscopy, flexible; diagnostic, including                            collection of specimen(s) by brushing or washing,                             when performed (separate procedure) Diagnosis Code(s):        --- Professional ---                           K64.1, Second degree hemorrhoids                           R19.5, Other fecal abnormalities                           D50.0, Iron deficiency anemia secondary to blood                            loss (chronic)                           K57.30, Diverticulosis of large intestine without                            perforation or abscess without bleeding CPT copyright 2022 American Medical Association. All rights reserved. The codes documented in this report are preliminary and upon coder review may  be revised to meet current compliance requirements. Gerrit Friends. Elya Tarquinio, MD Gennette Pac, MD 10/09/2023 4:32:34 PM This report has been signed electronically. Number of Addenda: 0

## 2023-10-09 NOTE — Plan of Care (Signed)

## 2023-10-09 NOTE — Anesthesia Postprocedure Evaluation (Signed)
 Anesthesia Post Note  Patient: Stacey Fitzgerald  Procedure(s) Performed: EGD (ESOPHAGOGASTRODUODENOSCOPY) COLONOSCOPY TATTOOING, USING INTRADERMAL PIGMENT  Patient location during evaluation: PACU Anesthesia Type: General Level of consciousness: awake and alert Pain management: pain level controlled Vital Signs Assessment: post-procedure vital signs reviewed and stable Respiratory status: spontaneous breathing, nonlabored ventilation, respiratory function stable and patient connected to nasal cannula oxygen Cardiovascular status: blood pressure returned to baseline and stable Postop Assessment: no apparent nausea or vomiting Anesthetic complications: no   There were no known notable events for this encounter.   Last Vitals:  Vitals:   10/09/23 1630 10/09/23 1657  BP: (!) 141/79 127/62  Pulse: 74 72  Resp: 16 16  Temp: 36.9 C 36.7 C  SpO2: 100% 96%    Last Pain:  Vitals:   10/09/23 1657  TempSrc: Oral  PainSc: 0-No pain                 Azana Kiesler L Kodi Steil

## 2023-10-09 NOTE — Plan of Care (Signed)

## 2023-10-09 NOTE — Transfer of Care (Signed)
 Immediate Anesthesia Transfer of Care Note  Patient: Stacey Fitzgerald  Procedure(s) Performed: EGD (ESOPHAGOGASTRODUODENOSCOPY) COLONOSCOPY TATTOOING, USING INTRADERMAL PIGMENT  Patient Location: PACU  Anesthesia Type:General  Level of Consciousness: sedated  Airway & Oxygen Therapy: Patient Spontanous Breathing  Post-op Assessment: Report given to RN  Post vital signs: Reviewed and stable  Last Vitals:  Vitals Value Taken Time  BP    Temp 36.8 C 10/09/23 1620  Pulse 79 10/09/23 1620  Resp 20 10/09/23 1620  SpO2 96 % 10/09/23 1620    Last Pain:  Vitals:   10/09/23 1536  TempSrc:   PainSc: 0-No pain         Complications: No notable events documented.

## 2023-10-09 NOTE — Interval H&P Note (Signed)
 History and Physical Interval Note:  10/09/2023 3:25 PM  Stacey Fitzgerald  has presented today for surgery, with the diagnosis of IDA, melena, history of 38 mm ascending colon polyp incompletely resected in 2021, site tattooed.  The various methods of treatment have been discussed with the patient and family. After consideration of risks, benefits and other options for treatment, the patient has consented to  Procedure(s): EGD (ESOPHAGOGASTRODUODENOSCOPY) (N/A) COLONOSCOPY (N/A) as a surgical intervention.  The patient's history has been reviewed, patient examined, no change in status, stable for surgery.  I have reviewed the patient's chart and labs.  Questions were answered to the patient's satisfaction.     Merrill Villarruel    Hemodynamically stable overnight.  Hemoglobin 9.4 this a.m. after 2 units transfused.  Agree with need for further GI evaluation.  She has heme called positive.  Questionable history of melena.  She has bona fide iron deficiency anemia.  It large polyp partially removed from her right colon a few years back is concerning.   She denies dysphagia.  Agree with the need for further GI evaluation.  To this end, I have offeredthe patient both EGD an colonoscopy today.  The risks, benefits, limitations, imponderables and alternatives regarding both EGD and colonoscopy have been reviewed with the patient. Questions have been answered. All parties agreeable.

## 2023-10-09 NOTE — Progress Notes (Signed)
 PROGRESS NOTE    Patient: Stacey Fitzgerald                            PCP: Debera Lat, PA-C                    DOB: 03/27/1941            DOA: 10/08/2023 [id number]             DOS: 10/09/2023, 11:04 AM   LOS: 0 days   Date of Service: The patient was seen and examined on 10/09/2023  Subjective:   The patient was seen and examined this morning. Hemodynamically stable. No issues overnight .  S/p 2U PRBC transfusion-tolerated well Bowel prep overnight for anticipated EGD colonoscopy today  Brief Narrative:   Stacey Fitzgerald is a 68 yof with PMH/o; hypertension, hyperlipidemia, memory deficit,?  Hypothyroidism... presenting for anemia. Denies any chest pain or shortness of breath, complaining of generalized weakness.,  "feeling drowsy, sleepy", generalized weakness. Denied having any dark, bloody stool, denies any bloody urine or vomiting blood.,  Not chronically on blood thinners. Sent in from PCP for low hemoglobin level.  ED evaluation/course: Blood pressure 129/63, pulse 61, temperature 98.3 F (36.8 C), temperature source Oral, resp. rate 18, height 5\' 8"  (1.727 m), weight 72.6 kg, SpO2 97%.  LABs; CBC; Hgb 6.4, platelets 405, BUN 21, creatinine 1.06, glucose 101 Iron 13, iron saturation 4, Hemoccult positive  EDP consulted gastroenterologist, transfusing 2U PRBC, initiating IV PPI  Requested to be admitted to medical anemia, GI bleed    Assessment & Plan:   Principal Problem:   Symptomatic anemia Active Problems:   Pure hypercholesterolemia   Hypothyroidism   Mild dementia without behavioral disturbance, psychotic disturbance, mood disturbance, or anxiety (HCC)   Essential hypertension     Assessment and Plan: * Symptomatic anemia - H&H closely -Following up with the anemia workup including labs, iron 9,  B12 (normal at 229), Folate (7.6) -All within normal excellent section of low iron level -Hemoccult positive -10/08/2023 s/p 2U PRBC blood  transfusion  -Hemoglobin confirmed 6.4, 2U PRBC to be transfused >>> 9.4  (baseline hemoglobin 11.4 same time this year 2024) -Continue Protonix IV 40 mg twice daily  -EDP consulted GI-appreciate follow-up -S/p overnight GI prep for anticipated EGD/colonoscopy today 10/09/2023   Iron deficiency with total iron of 13 >>9  Iron/TIBC/Ferritin/ %Sat    Component Value Date/Time   IRON 9 (L) 10/08/2023 1119   IRON 13 (L) 10/04/2023 0835   TIBC 368 10/08/2023 1119   TIBC 349 10/04/2023 0835   FERRITIN 4 (L) 10/08/2023 1119   IRONPCTSAT 2 (L) 10/08/2023 1119   IRONPCTSAT 4 (LL) 10/04/2023 0835     -IV iron,..-  supplements p.o. iron on discharge  Hypothyroidism - Not on any home medications -Normal at 2.5  Pure hypercholesterolemia Lipid Panel     Component Value Date/Time   CHOL 166 10/04/2023 0835   TRIG 131 10/04/2023 0835   HDL 42 10/04/2023 0835   CHOLHDL 4.0 10/04/2023 0835   CHOLHDL 5.0 (H) 05/31/2017 0814   LDLCALC 101 (H) 10/04/2023 0835   LDLCALC 151 (H) 05/31/2017 0814   LABVLDL 23 10/04/2023 0835   - Continue statins  Mild dementia without behavioral disturbance, psychotic disturbance, mood disturbance, or anxiety (HCC) - Monitoring closely -Not on any home medications  Essential hypertension - Currently stable resuming home medication of lisinopril     ------------------------------------------------------------------------------------------------------------------------------------------  Nutritional status:  The patient's BMI is: Body mass index is 22.96 kg/m. I agree with the assessment and plan as outlined ----------------------------------------------------------------------------------------------------------------------------------------  DVT prophylaxis:  TED hose Start: 10/08/23 1126 SCDs Start: 10/08/23 1126   Code Status:   Code Status: Full Code  Family Communication: No family member present at bedside- attempt will be made to  update daily The above findings and plan of care has been discussed with patient (and family)  in detail,  they expressed understanding and agreement of above. -Advance care planning has been discussed.   Admission status:   Status is: Observation The patient remains OBS appropriate and will d/c before 2 midnights.   Disposition: From  - home             Planning for discharge in 1 days: to Home in AM    Procedures:   No admission procedures for hospital encounter.   Antimicrobials:  Anti-infectives (From admission, onward)    None        Medication:   ferrous sulfate  325 mg Oral BID WC   lisinopril  20 mg Oral BID   pantoprazole (PROTONIX) IV  40 mg Intravenous Q12H   simvastatin  20 mg Oral QHS   sodium chloride flush  3 mL Intravenous Q12H    acetaminophen **OR** acetaminophen, bisacodyl, hydrALAZINE, HYDROmorphone (DILAUDID) injection, ipratropium, levalbuterol, ondansetron **OR** ondansetron (ZOFRAN) IV, oxyCODONE, senna-docusate, sodium chloride flush, sodium phosphate, traZODone   Objective:   Vitals:   10/08/23 2048 10/08/23 2100 10/09/23 0500 10/09/23 0519  BP: (!) 140/67 (!) 177/94  (!) 158/63  Pulse:  91  65  Resp:  19  17  Temp:  98.3 F (36.8 C)  98.7 F (37.1 C)  TempSrc:    Oral  SpO2:  99%  96%  Weight:   68.5 kg   Height:        Intake/Output Summary (Last 24 hours) at 10/09/2023 1104 Last data filed at 10/08/2023 1926 Gross per 24 hour  Intake 1532 ml  Output --  Net 1532 ml   Filed Weights   10/08/23 0847 10/08/23 1558 10/09/23 0500  Weight: 72.6 kg 68.6 kg 68.5 kg     Physical examination:   Constitution:  Alert, cooperative, no distress,  Appears calm and comfortable  Psychiatric:   Normal and stable mood and affect, cognition intact,   HEENT:        Normocephalic, PERRL, otherwise with in Normal limits  Chest:         Chest symmetric Cardio vascular:  S1/S2, RRR, No murmure, No Rubs or Gallops  pulmonary: Clear to  auscultation bilaterally, respirations unlabored, negative wheezes / crackles Abdomen: Soft, non-tender, non-distended, bowel sounds,no masses, no organomegaly Muscular skeletal: Limited exam - in bed, able to move all 4 extremities,   Neuro: CNII-XII intact. , normal motor and sensation, reflexes intact  Extremities: No pitting edema lower extremities, +2 pulses  Skin: Dry, warm to touch, negative for any Rashes, No open wounds Wounds: per nursing documentation   ------------------------------------------------------------------------------------------------------------------------------------------    LABs:     Latest Ref Rng & Units 10/09/2023    4:49 AM 10/08/2023   10:24 AM 10/04/2023    8:35 AM  CBC  WBC 4.0 - 10.5 K/uL 10.1  9.5  9.8   Hemoglobin 12.0 - 15.0 g/dL 9.4  6.4  6.7   Hematocrit 36.0 - 46.0 % 32.3  22.7  23.5   Platelets 150 - 400 K/uL 436  405  371  Latest Ref Rng & Units 10/09/2023    4:49 AM 10/08/2023   10:24 AM 10/04/2023    8:35 AM  CMP  Glucose 70 - 99 mg/dL 161  096  99   BUN 8 - 23 mg/dL 17  21  23    Creatinine 0.44 - 1.00 mg/dL 0.45  4.09  8.11   Sodium 135 - 145 mmol/L 146  138  140   Potassium 3.5 - 5.1 mmol/L 4.0  4.3  4.7   Chloride 98 - 111 mmol/L 116  107  105   CO2 22 - 32 mmol/L 22  21  21    Calcium 8.9 - 10.3 mg/dL 9.4  9.4  9.7   Total Protein 6.5 - 8.1 g/dL  6.9  6.4   Total Bilirubin 0.0 - 1.2 mg/dL  0.4  0.2   Alkaline Phos 38 - 126 U/L  55  67   AST 15 - 41 U/L  10  7   ALT 0 - 44 U/L  8  7        Micro Results No results found for this or any previous visit (from the past 240 hours).  Radiology Reports No results found.  SIGNED: Kendell Bane, MD, FHM. FAAFP. [facility] - [facility] Time spent - 55 min.  In seeing, evaluating and examining the patient. Reviewing medical records, labs, drawn plan of care. [facility],  Pager (please use [email or web address] to page/ text) Please use Epic Secure Chat for  non-urgent communication (7AM-7PM)  If 7PM-7AM, please contact night-coverage [email or web address], 10/09/2023, 11:04 AM

## 2023-10-10 ENCOUNTER — Inpatient Hospital Stay (HOSPITAL_COMMUNITY)

## 2023-10-10 ENCOUNTER — Encounter (HOSPITAL_COMMUNITY): Payer: Self-pay | Admitting: Internal Medicine

## 2023-10-10 ENCOUNTER — Observation Stay (HOSPITAL_COMMUNITY)

## 2023-10-10 DIAGNOSIS — Z6824 Body mass index (BMI) 24.0-24.9, adult: Secondary | ICD-10-CM | POA: Diagnosis not present

## 2023-10-10 DIAGNOSIS — D630 Anemia in neoplastic disease: Secondary | ICD-10-CM | POA: Diagnosis present

## 2023-10-10 DIAGNOSIS — Z818 Family history of other mental and behavioral disorders: Secondary | ICD-10-CM | POA: Diagnosis not present

## 2023-10-10 DIAGNOSIS — D72829 Elevated white blood cell count, unspecified: Secondary | ICD-10-CM | POA: Diagnosis present

## 2023-10-10 DIAGNOSIS — R634 Abnormal weight loss: Secondary | ICD-10-CM | POA: Diagnosis present

## 2023-10-10 DIAGNOSIS — K222 Esophageal obstruction: Secondary | ICD-10-CM | POA: Diagnosis present

## 2023-10-10 DIAGNOSIS — M6281 Muscle weakness (generalized): Secondary | ICD-10-CM | POA: Diagnosis present

## 2023-10-10 DIAGNOSIS — K641 Second degree hemorrhoids: Secondary | ICD-10-CM | POA: Diagnosis present

## 2023-10-10 DIAGNOSIS — Z800 Family history of malignant neoplasm of digestive organs: Secondary | ICD-10-CM | POA: Diagnosis not present

## 2023-10-10 DIAGNOSIS — I10 Essential (primary) hypertension: Secondary | ICD-10-CM | POA: Diagnosis present

## 2023-10-10 DIAGNOSIS — R5381 Other malaise: Secondary | ICD-10-CM | POA: Diagnosis present

## 2023-10-10 DIAGNOSIS — K6389 Other specified diseases of intestine: Secondary | ICD-10-CM | POA: Diagnosis not present

## 2023-10-10 DIAGNOSIS — E669 Obesity, unspecified: Secondary | ICD-10-CM | POA: Diagnosis present

## 2023-10-10 DIAGNOSIS — R112 Nausea with vomiting, unspecified: Secondary | ICD-10-CM | POA: Diagnosis not present

## 2023-10-10 DIAGNOSIS — F03A0 Unspecified dementia, mild, without behavioral disturbance, psychotic disturbance, mood disturbance, and anxiety: Secondary | ICD-10-CM | POA: Diagnosis present

## 2023-10-10 DIAGNOSIS — D500 Iron deficiency anemia secondary to blood loss (chronic): Secondary | ICD-10-CM | POA: Diagnosis present

## 2023-10-10 DIAGNOSIS — E039 Hypothyroidism, unspecified: Secondary | ICD-10-CM | POA: Diagnosis present

## 2023-10-10 DIAGNOSIS — Z881 Allergy status to other antibiotic agents status: Secondary | ICD-10-CM | POA: Diagnosis not present

## 2023-10-10 DIAGNOSIS — K449 Diaphragmatic hernia without obstruction or gangrene: Secondary | ICD-10-CM | POA: Diagnosis present

## 2023-10-10 DIAGNOSIS — Z833 Family history of diabetes mellitus: Secondary | ICD-10-CM | POA: Diagnosis not present

## 2023-10-10 DIAGNOSIS — Z823 Family history of stroke: Secondary | ICD-10-CM | POA: Diagnosis not present

## 2023-10-10 DIAGNOSIS — C182 Malignant neoplasm of ascending colon: Principal | ICD-10-CM

## 2023-10-10 DIAGNOSIS — M1990 Unspecified osteoarthritis, unspecified site: Secondary | ICD-10-CM | POA: Diagnosis present

## 2023-10-10 DIAGNOSIS — D649 Anemia, unspecified: Secondary | ICD-10-CM | POA: Diagnosis present

## 2023-10-10 DIAGNOSIS — Z79899 Other long term (current) drug therapy: Secondary | ICD-10-CM | POA: Diagnosis not present

## 2023-10-10 DIAGNOSIS — E7800 Pure hypercholesterolemia, unspecified: Secondary | ICD-10-CM | POA: Diagnosis present

## 2023-10-10 LAB — CBC
HCT: 29.7 % — ABNORMAL LOW (ref 36.0–46.0)
Hemoglobin: 8.8 g/dL — ABNORMAL LOW (ref 12.0–15.0)
MCH: 21.9 pg — ABNORMAL LOW (ref 26.0–34.0)
MCHC: 29.6 g/dL — ABNORMAL LOW (ref 30.0–36.0)
MCV: 73.9 fL — ABNORMAL LOW (ref 80.0–100.0)
Platelets: 368 10*3/uL (ref 150–400)
RBC: 4.02 MIL/uL (ref 3.87–5.11)
RDW: 19.3 % — ABNORMAL HIGH (ref 11.5–15.5)
WBC: 11.9 10*3/uL — ABNORMAL HIGH (ref 4.0–10.5)
nRBC: 0 % (ref 0.0–0.2)

## 2023-10-10 LAB — BASIC METABOLIC PANEL
Anion gap: 8 (ref 5–15)
BUN: 12 mg/dL (ref 8–23)
CO2: 23 mmol/L (ref 22–32)
Calcium: 9 mg/dL (ref 8.9–10.3)
Chloride: 110 mmol/L (ref 98–111)
Creatinine, Ser: 0.97 mg/dL (ref 0.44–1.00)
GFR, Estimated: 59 mL/min — ABNORMAL LOW (ref >60)
Glucose, Bld: 106 mg/dL — ABNORMAL HIGH (ref 70–99)
Potassium: 3.8 mmol/L (ref 3.5–5.1)
Sodium: 141 mmol/L (ref 135–145)

## 2023-10-10 LAB — HEMOGLOBIN A1C
Hgb A1c MFr Bld: 5.2 % (ref 4.8–5.6)
Mean Plasma Glucose: 102.54 mg/dL

## 2023-10-10 MED ORDER — CHLORHEXIDINE GLUCONATE CLOTH 2 % EX PADS
6.0000 | MEDICATED_PAD | Freq: Once | CUTANEOUS | Status: AC
  Administered 2023-10-11: 6 via TOPICAL

## 2023-10-10 MED ORDER — ENSURE PRE-SURGERY PO LIQD
296.0000 mL | Freq: Once | ORAL | Status: DC
  Filled 2023-10-10: qty 296

## 2023-10-10 MED ORDER — SODIUM CHLORIDE 0.9 % IV SOLN
1.0000 g | INTRAVENOUS | Status: AC
  Administered 2023-10-11: 1 g via INTRAVENOUS
  Filled 2023-10-10 (×2): qty 1000

## 2023-10-10 MED ORDER — IOHEXOL 9 MG/ML PO SOLN
500.0000 mL | ORAL | Status: AC
  Administered 2023-10-10 (×2): 500 mL via ORAL

## 2023-10-10 MED ORDER — ALVIMOPAN 12 MG PO CAPS
12.0000 mg | ORAL_CAPSULE | ORAL | Status: AC
  Administered 2023-10-11: 12 mg via ORAL
  Filled 2023-10-10: qty 1

## 2023-10-10 MED ORDER — METRONIDAZOLE 500 MG PO TABS
1000.0000 mg | ORAL_TABLET | ORAL | Status: AC
  Administered 2023-10-10 (×3): 1000 mg via ORAL
  Filled 2023-10-10 (×3): qty 2

## 2023-10-10 MED ORDER — ENSURE PRE-SURGERY PO LIQD
592.0000 mL | Freq: Once | ORAL | Status: AC
  Administered 2023-10-10: 592 mL via ORAL
  Filled 2023-10-10: qty 592

## 2023-10-10 MED ORDER — IOHEXOL 9 MG/ML PO SOLN
ORAL | Status: AC
  Filled 2023-10-10: qty 1000

## 2023-10-10 MED ORDER — IRON SUCROSE 300 MG IVPB - SIMPLE MED: 300.0000 mg | Freq: Once | Status: DC

## 2023-10-10 MED ORDER — BISACODYL 5 MG PO TBEC
20.0000 mg | DELAYED_RELEASE_TABLET | Freq: Once | ORAL | Status: AC
  Administered 2023-10-10: 20 mg via ORAL
  Filled 2023-10-10: qty 4

## 2023-10-10 MED ORDER — LEVOFLOXACIN IN D5W 750 MG/150ML IV SOLN
750.0000 mg | Freq: Once | INTRAVENOUS | Status: AC
  Administered 2023-10-10: 750 mg via INTRAVENOUS
  Filled 2023-10-10: qty 150

## 2023-10-10 MED ORDER — NEOMYCIN SULFATE 500 MG PO TABS
1000.0000 mg | ORAL_TABLET | ORAL | Status: AC
  Administered 2023-10-10 (×3): 1000 mg via ORAL
  Filled 2023-10-10 (×3): qty 2

## 2023-10-10 MED ORDER — IOHEXOL 300 MG/ML  SOLN
100.0000 mL | Freq: Once | INTRAMUSCULAR | Status: AC | PRN
  Administered 2023-10-10: 100 mL via INTRAVENOUS

## 2023-10-10 MED ORDER — CHLORHEXIDINE GLUCONATE CLOTH 2 % EX PADS
6.0000 | MEDICATED_PAD | Freq: Once | CUTANEOUS | Status: AC
  Administered 2023-10-10: 6 via TOPICAL

## 2023-10-10 NOTE — Progress Notes (Addendum)
  Progress Note   Patient: Stacey Fitzgerald BJY:[id number] DOB: 05-10-1941 DOA: 10/08/2023     0 DOS: the patient was seen and examined on 10/10/2023   Brief hospital course: 83 yo with H/o; hypertension, hyperlipidemia, memory deficit,?  Hypothyroidism... presenting for anemia.  Hgb 6.4 on presentation.  Assessment and Plan:  Acute on chronic iron deficiency anemia - Secondary to chronic blood loss.  Status post 2 units packed RBCs.  Iron studies showing iron 9, sat 2, ferritin 4, normal TIBC suggestive severe iron deficiency.  Will give IV iron.  Will recheck CBC in AM.  Acute on chronic lower GI bleed - GI following closely.  Status post EGD and colonoscopy 3/19.  EGD showing no acute bleed.  Colonoscopy noting newly diagnosed malignancy.  No diagnosis of ascending colon mass - GI performed colonoscopy with biopsies 3/19.  Pathology pending.  General surgery consulted.  Scheduled for robotic assisted laparoscopic right hemicolectomy 3/21.  Mild dementia - Monitoring closely.  Patient very pleasant with some possible memory issues.  Patient's husband at bedside to help with decision communication issues.  Hypertension - Resuming home medication regimen.  Goals of care - Had a long frank discussion with patient and her husband this morning about her colonoscopy findings.  Patient admits to having colonoscopy 3 years ago.  Discussed the possibility that patient may need to have surgical resection, staging, or other modalities such as chemotherapy.  Patient herself and husband appreciative of this discussion.       Subjective: Patient resting comfortably this morning.  Denies any shortness of breath, chest pain, nausea, vomiting, abdominal pain.  Tolerated her procedures well yesterday.  Apparently was not aware of her colonoscopy findings.  Spent a great deal of time this morning explaining her new findings, possible prognosis, possible treatment modalities.  Answered any questions  from her and her husband at that time.  Physical Exam: Vitals:   10/09/23 2108 10/10/23 0423 10/10/23 0500 10/10/23 0845  BP: (!) 155/55 (!) 148/67  138/84  Pulse: 71 85  77  Resp: 20 18    Temp: 98.1 F (36.7 C) 99.4 F (37.4 C)    TempSrc: Oral Oral    SpO2: 99% 99%  95%  Weight:   66.2 kg   Height:       GENERAL:  Alert, pleasant, no acute distress  HEENT:  EOMI CARDIOVASCULAR:  RRR, no murmurs appreciated RESPIRATORY:  Clear to auscultation, no wheezing, rales, or rhonchi GASTROINTESTINAL:  Soft, nontender, nondistended EXTREMITIES:  No LE edema bilaterally NEURO:  No new focal deficits appreciated SKIN:  No rashes noted PSYCH:  Appropriate mood and affect   Data Reviewed:  Discussed the findings of the patient's colonoscopy  Family Communication: Husband at bedside  Disposition: Status is: Inpatient The patient will require care spanning > 2 midnights and should be moved to inpatient because: Ongoing blood loss anemia, surgical colectomy pending  Planned Discharge Destination: Home with Home Health    Time spent: 55 minutes  Author: Deanna Artis, DO 10/10/2023 2:34 PM  For on call review [email or web address].

## 2023-10-10 NOTE — Consult Note (Addendum)
 I was present with the medical student for this service. I personally verified the history of present illness, performed the physical exam, and made the plan for this encounter. I have verified the medical student's documentation and made modifications where appropriately. I have personally documented in my own words a brief history, physical, and plan below.     Patient with ascending colon mass/ cancer found on colonoscopy. Bleeding and H&H drifting back down. Came in weak and anemic. Bowel preparation completed. Will proceed with surgeyr tomorrow.   Discussed robotic assisted laparoscopic right hemicolectomy and risk of bleeding, infection, anastomotic leak, injury to other organs, injury to ureter.   Discussed open case if unable to do laparoscopic.   Discussed potentially needing more blood, post op course of 4-5 days, possible need for rehab.   Antibiotic prep today Clear diet  ERAS preop ordered Discussed with her and son. CT  today to further assess mass and location, any extension   Algis Greenhouse, MD [facility] [address] [place], [place] [address] [phone] (office)   [facility] Consult  Reason for Consult: Abnormal labs and ascending colon spiral mass Referring Physician: Jena Gauss    HPI: Stacey Fitzgerald is a 83 y.o. female with a history of colonic polyps and microcytic anemia who was admitted on 3/18 for anemia. That day she received 2 units of blood, and the following day 3/19 was seen by GI who conducted upper endoscopy which was normal and colonoscopy which showed a mass in the ascending colon. Her son, who was present, noted she was unaware of the findings and waiting on the GI physician.  This morning she denies any abdominal pain or discomfort, dark, bloody stools or BRBPR. She denies fever, chills, lightheadedness, shortness of breath, but noted she needed help getting up to use the restroom this morning,  which is not usual. She said otherwise, she was feeling good and hoping to be discharged soon.  Past Medical History:  Diagnosis Date   Arthritis    Female cystocele    Hyperlipidemia    Hypertension    Hypothyroidism    Menopausal state    Obesity, unspecified    Pessary maintenance    Rectocele     Past Surgical History:  Procedure Laterality Date   BREAST EXCISIONAL BIOPSY Left 1972   benign   BREAST SURGERY     benign cyst   COLONOSCOPY     COLONOSCOPY WITH PROPOFOL N/A 11/23/2019   Procedure: COLONOSCOPY WITH PROPOFOL;  Surgeon: Toledo, Boykin Nearing, MD;  Location: [facility] ENDOSCOPY;  Service: Gastroenterology;  Laterality: N/A;   miscarrage     TUBAL LIGATION     uterine pessary      Family History  Problem Relation Age of Onset   Diabetes Mother    Seizures Mother    CVA Father    Dementia Brother    Diabetes Maternal Grandfather    Mental illness Sister    Breast cancer Neg Hx     Social History   Tobacco Use   Smoking status: Never   Smokeless tobacco: Never  Vaping Use   Vaping status: Never Used  Substance Use Topics   Alcohol use: Not Currently    Alcohol/week: 0.0 standard drinks of alcohol   Drug use: No    Medications: I have reviewed the patient's current medications. Current Facility-Administered Medications  Medication Dose Route Frequency Provider Last Rate Last Admin   acetaminophen (TYLENOL) tablet 650 mg  650 mg  Oral Q6H PRN Corbin Ade, MD   650 mg at 10/09/23 1123   Or   acetaminophen (TYLENOL) suppository 650 mg  650 mg Rectal Q6H PRN Corbin Ade, MD       alvimopan (ENTEREG) capsule 12 mg  12 mg Oral On Call to OR Lucretia Roers, MD       bisacodyl (DULCOLAX) EC tablet 20 mg  20 mg Oral Once Lucretia Roers, MD       bisacodyl (DULCOLAX) EC tablet 5 mg  5 mg Oral Daily PRN Rourk, Gerrit Friends, MD       Chlorhexidine Gluconate Cloth 2 % PADS 6 each  6 each Topical Once Lucretia Roers, MD       And   Chlorhexidine  Gluconate Cloth 2 % PADS 6 each  6 each Topical Once Lucretia Roers, MD       [START ON 10/11/2023] ertapenem [facility]) 1 g in sodium chloride 0.9 % 100 mL IVPB  1 g Intravenous On Call to OR Lucretia Roers, MD       [START ON 10/11/2023] feeding supplement (ENSURE PRE-SURGERY) liquid 296 mL  296 mL Oral Once Lucretia Roers, MD       feeding supplement (ENSURE PRE-SURGERY) liquid 592 mL  592 mL Oral Once Lucretia Roers, MD       ferric gluconate (FERRLECIT) 250 mg in sodium chloride 0.9 % 250 mL IVPB  250 mg Intravenous Once Corbin Ade, MD       ferrous sulfate tablet 325 mg  325 mg Oral BID WC Corbin Ade, MD   325 mg at 10/10/23 1610   hydrALAZINE (APRESOLINE) injection 10 mg  10 mg Intravenous Q4H PRN Corbin Ade, MD       HYDROmorphone (DILAUDID) injection 0.5-1 mg  0.5-1 mg Intravenous Q2H PRN Rourk, Gerrit Friends, MD       ipratropium (ATROVENT) nebulizer solution 0.5 mg  0.5 mg Nebulization Q6H PRN Rourk, Gerrit Friends, MD       levalbuterol [facility]) nebulizer solution 0.63 mg  0.63 mg Nebulization Q6H PRN Corbin Ade, MD       lisinopril (ZESTRIL) tablet 20 mg  20 mg Oral BID Corbin Ade, MD   20 mg at 10/10/23 0849   neomycin (MYCIFRADIN) tablet 1,000 mg  1,000 mg Oral 3 times per day Lucretia Roers, MD       And   metroNIDAZOLE (FLAGYL) tablet 1,000 mg  1,000 mg Oral 3 times per day Lucretia Roers, MD       ondansetron [facility]) tablet 4 mg  4 mg Oral Q6H PRN Corbin Ade, MD       Or   ondansetron [facility]) injection 4 mg  4 mg Intravenous Q6H PRN Corbin Ade, MD       oxyCODONE (Oxy IR/ROXICODONE) immediate release tablet 5 mg  5 mg Oral Q4H PRN Corbin Ade, MD   5 mg at 10/09/23 2049   pantoprazole (PROTONIX) injection 40 mg  40 mg Intravenous Q12H Corbin Ade, MD   40 mg at 10/10/23 9604   senna-docusate (Senokot-S) tablet 1 tablet  1 tablet Oral QHS PRN Corbin Ade, MD       simvastatin (ZOCOR) tablet 20 mg  20 mg Oral QHS Corbin Ade, MD   20 mg at 10/09/23 2049   sodium chloride flush (NS) 0.9 % injection 3 mL  3 mL Intravenous Q12H  Corbin Ade, MD   3 mL at 10/10/23 1914   sodium chloride flush (NS) 0.9 % injection 3-10 mL  3-10 mL Intravenous PRN Rourk, Gerrit Friends, MD       sodium chloride flush (NS) 0.9 % injection 3-10 mL  3-10 mL Intravenous Q12H Ronelle Nigh, MD   10 mL at 10/10/23 0854   sodium chloride flush (NS) 0.9 % injection 3-10 mL  3-10 mL Intravenous PRN Ronelle Nigh, MD       sodium phosphate (FLEET) enema 1 enema  1 enema Rectal Once PRN Rourk, Gerrit Friends, MD       traZODone (DESYREL) tablet 25 mg  25 mg Oral QHS PRN Rourk, Gerrit Friends, MD        Allergies  Allergen Reactions   Cephalexin Hives    Patient reports that she is not allergic to this medication but she has not taken it since it was listed as an allergy     ROS:  Gastrointestinal: negative. Last BM yesterday morning. States she has not passed gas since Tuesday.  Blood pressure (!) 148/67, pulse 85, temperature 99.4 F (37.4 C), temperature source Oral, resp. rate 18, height 5\' 8"  (1.727 m), weight 66.2 kg, SpO2 99%. Physical Exam Constitutional:      Appearance: Normal appearance.  HENT:     Head: Normocephalic.     Nose: Nose normal.  Cardiovascular:     Rate and Rhythm: Normal rate.  Pulmonary:     Effort: Pulmonary effort is normal.  Abdominal:     General: Bowel sounds are increased. There is no distension.     Palpations: Abdomen is soft.     Tenderness: There is no abdominal tenderness (Deep palpation LLQ).  Musculoskeletal:        General: Normal range of motion.  Skin:    General: Skin is warm.  Neurological:     General: No focal deficit present.     Mental Status: She is alert.  Psychiatric:        Mood and Affect: Mood normal.     Results: Results for orders placed or performed during the hospital encounter of 10/08/23 (from the past 48 hours)  Comprehensive metabolic panel     Status: Abnormal    Collection Time: 10/08/23 10:24 AM  Result Value Ref Range   Sodium 138 135 - 145 mmol/L   Potassium 4.3 3.5 - 5.1 mmol/L   Chloride 107 98 - 111 mmol/L   CO2 21 (L) 22 - 32 mmol/L   Glucose, Bld 101 (H) 70 - 99 mg/dL    Comment: Glucose reference range applies only to samples taken after fasting for at least 8 hours.   BUN 21 8 - 23 mg/dL   Creatinine, Ser 7.82 (H) 0.44 - 1.00 mg/dL   Calcium 9.4 8.9 - 95.6 mg/dL   Total Protein 6.9 6.5 - 8.1 g/dL   Albumin 3.7 3.5 - 5.0 g/dL   AST 10 (L) 15 - 41 U/L   ALT 8 0 - 44 U/L   Alkaline Phosphatase 55 38 - 126 U/L   Total Bilirubin 0.4 0.0 - 1.2 mg/dL   GFR, Estimated 53 (L) >60 mL/min    Comment: (NOTE) Calculated using the CKD-EPI Creatinine Equation (2021)    Anion gap 10 5 - 15    Comment: Performed at [facility], [address] [place], [place] [address]  CBC with Differential     Status: Abnormal   Collection Time: 10/08/23 10:24 AM  Result Value Ref Range   WBC 9.5 4.0 - 10.5 K/uL   RBC 3.25 (L) 3.87 - 5.11 MIL/uL   Hemoglobin 6.4 (LL) 12.0 - 15.0 g/dL    Comment: REPEATED TO VERIFY Reticulocyte Hemoglobin testing may be clinically indicated, consider ordering this additional test [id number] THIS CRITICAL RESULT HAS VERIFIED AND BEEN CALLED TO M WHITE BY ANGELA WILSON ON 03 18 2025 AT 1050, AND HAS BEEN READ BACK.     HCT 22.7 (L) 36.0 - 46.0 %   MCV 69.8 (L) 80.0 - 100.0 fL   MCH 19.7 (L) 26.0 - 34.0 pg   MCHC 28.2 (L) 30.0 - 36.0 g/dL   RDW 54.0 (H) 98.1 - 19.1 %   Platelets 405 (H) 150 - 400 K/uL   nRBC 0.0 0.0 - 0.2 %   Neutrophils Relative % 74 %   Neutro Abs 7.0 1.7 - 7.7 K/uL   Lymphocytes Relative 15 %   Lymphs Abs 1.4 0.7 - 4.0 K/uL   Monocytes Relative 8 %   Monocytes Absolute 0.8 0.1 - 1.0 K/uL   Eosinophils Relative 2 %   Eosinophils Absolute 0.2 0.0 - 0.5 K/uL   Basophils Relative 1 %   Basophils Absolute 0.1 0.0 - 0.1 K/uL   WBC Morphology MORPHOLOGY UNREMARKABLE    RBC Morphology See Note      Comment: MICROCYTOSIS   Smear Review PLATELETS APPEAR INCREASED     Comment: PLATELET COUNT CONFIRMED BY SMEAR   Immature Granulocytes 0 %   Abs Immature Granulocytes 0.04 0.00 - 0.07 K/uL    Comment: Performed at [facility], [address] [place], [place] [address]  Protime-INR     Status: None   Collection Time: 10/08/23 10:24 AM  Result Value Ref Range   Prothrombin Time 13.4 11.4 - 15.2 seconds   INR 1.0 0.8 - 1.2    Comment: (NOTE) INR goal varies based on device and disease states. Performed at [facility], [address] [place], [place] [address]   Type and screen [facility]     Status: None   Collection Time: 10/08/23 10:24 AM  Result Value Ref Range   ABO/RH(D) B NEG    Antibody Screen NEG    Sample Expiration 10/11/2023,2359    Unit Number [id number]    Blood Component Type RED CELLS,LR    Unit division 00    Status of Unit ISSUED,FINAL    Transfusion Status OK TO TRANSFUSE    Crossmatch Result Compatible    Unit Number [id number]    Blood Component Type RED CELLS,LR    Unit division 00    Status of Unit ISSUED,FINAL    Transfusion Status OK TO TRANSFUSE    Crossmatch Result      Compatible Performed at [facility], [address], [place], [place] [address]   POC occult blood, ED RN will collect     Status: None   Collection Time: 10/08/23 10:37 AM  Result Value Ref Range   Occult Blood positive     Comment: abnormal  ABO/Rh     Status: None   Collection Time: 10/08/23 11:17 AM  Result Value Ref Range   ABO/RH(D)      B NEG Performed at [facility], [address] [place], [place] [address]   Vitamin B12     Status: None   Collection Time: 10/08/23 11:19 AM  Result Value Ref Range   Vitamin B-12 229 180 - 914 pg/mL    Comment: (NOTE)  This assay is not validated for testing neonatal or myeloproliferative syndrome specimens for Vitamin B12 levels. Performed at [facility], [address] [place], [place] [address]   Folate      Status: None   Collection Time: 10/08/23 11:19 AM  Result Value Ref Range   Folate 7.6 >5.9 ng/mL    Comment: Performed at [facility], [address], [place], [place] [address]  Iron and TIBC     Status: Abnormal   Collection Time: 10/08/23 11:19 AM  Result Value Ref Range   Iron 9 (L) 28 - 170 ug/dL   TIBC 644 034 - 742 ug/dL   Saturation Ratios 2 (L) 10.4 - 31.8 %   UIBC 359 ug/dL    Comment: Performed at [facility], [address], [place], [place] [address]  Ferritin     Status: Abnormal   Collection Time: 10/08/23 11:19 AM  Result Value Ref Range   Ferritin 4 (L) 11 - 307 ng/mL    Comment: Performed at [facility], [address] [place], [place] [address]  Reticulocytes     Status: Abnormal   Collection Time: 10/08/23 11:19 AM  Result Value Ref Range   Retic Ct Pct 0.9 0.4 - 3.1 %   RBC. 3.00 (L) 3.87 - 5.11 MIL/uL   Retic Count, Absolute 27.9 19.0 - 186.0 K/uL   Immature Retic Fract 14.0 2.3 - 15.9 %    Comment: Performed at [facility], [address], [place], [place] [address]  Prepare RBC (crossmatch)     Status: None   Collection Time: 10/08/23 11:19 AM  Result Value Ref Range   Order Confirmation      ORDER PROCESSED BY BLOOD BANK Performed at [facility], [address] [place], [place] [address]   Magnesium     Status: None   Collection Time: 10/08/23 11:19 AM  Result Value Ref Range   Magnesium 1.9 1.7 - 2.4 mg/dL    Comment: Performed at [facility], [address], [place], [place] [address]  Phosphorus     Status: None   Collection Time: 10/08/23 11:19 AM  Result Value Ref Range   Phosphorus 3.2 2.5 - 4.6 mg/dL    Comment: Performed at [facility], [address], [place], [place] [address]  TSH     Status: None   Collection Time: 10/08/23 11:19 AM  Result Value Ref Range   TSH 2.500 0.350 - 4.500 uIU/mL    Comment: Performed by a 3rd Generation assay with a functional sensitivity of <=0.01 uIU/mL. Performed at [facility], [address]  [place], [place] [address]   Basic metabolic panel     Status: Abnormal   Collection Time: 10/09/23  4:49 AM  Result Value Ref Range   Sodium 146 (H) 135 - 145 mmol/L    Comment: DELTA CHECK NOTED   Potassium 4.0 3.5 - 5.1 mmol/L   Chloride 116 (H) 98 - 111 mmol/L   CO2 22 22 - 32 mmol/L   Glucose, Bld 107 (H) 70 - 99 mg/dL    Comment: Glucose reference range applies only to samples taken after fasting for at least 8 hours.   BUN 17 8 - 23 mg/dL   Creatinine, Ser 5.57 0.44 - 1.00 mg/dL   Calcium 9.4 8.9 - 32.2 mg/dL   GFR, Estimated 59 (L) >60 mL/min    Comment: (NOTE) Calculated using the CKD-EPI Creatinine Equation (2021)    Anion gap 8 5 - 15    Comment:  Performed at [facility], [address], [place], [place] [address]  CBC     Status: Abnormal   Collection Time: 10/09/23  4:49 AM  Result Value Ref Range   WBC 10.1 4.0 - 10.5 K/uL   RBC 4.37 3.87 - 5.11 MIL/uL   Hemoglobin 9.4 (L) 12.0 - 15.0 g/dL    Comment: POST TRANSFUSION SPECIMEN   HCT 32.3 (L) 36.0 - 46.0 %   MCV 73.9 (L) 80.0 - 100.0 fL   MCH 21.5 (L) 26.0 - 34.0 pg   MCHC 29.1 (L) 30.0 - 36.0 g/dL   RDW 60.4 (H) 54.0 - 98.1 %   Platelets 436 (H) 150 - 400 K/uL   nRBC 0.0 0.0 - 0.2 %    Comment: Performed at [facility], [address], [place], [place] [address]  Protime-INR     Status: None   Collection Time: 10/09/23  4:49 AM  Result Value Ref Range   Prothrombin Time 14.1 11.4 - 15.2 seconds   INR 1.1 0.8 - 1.2    Comment: (NOTE) INR goal varies based on device and disease states. Performed at [facility], [address] [place], [place] [address]   APTT     Status: None   Collection Time: 10/09/23  4:49 AM  Result Value Ref Range   aPTT 28 24 - 36 seconds    Comment: Performed at [facility], [address] [place], [place] [address]  Glucose, capillary     Status: None   Collection Time: 10/09/23  7:58 AM  Result Value Ref Range   Glucose-Capillary 97 70 - 99 mg/dL    Comment: Glucose reference  range applies only to samples taken after fasting for at least 8 hours.   Comment 1 Notify RN    Comment 2 Document in Chart   Basic metabolic panel     Status: Abnormal   Collection Time: 10/10/23  5:15 AM  Result Value Ref Range   Sodium 141 135 - 145 mmol/L   Potassium 3.8 3.5 - 5.1 mmol/L   Chloride 110 98 - 111 mmol/L   CO2 23 22 - 32 mmol/L   Glucose, Bld 106 (H) 70 - 99 mg/dL    Comment: Glucose reference range applies only to samples taken after fasting for at least 8 hours.   BUN 12 8 - 23 mg/dL   Creatinine, Ser 6.57 0.44 - 1.00 mg/dL   Calcium 9.0 8.9 - 84.6 mg/dL   GFR, Estimated 59 (L) >60 mL/min    Comment: (NOTE) Calculated using the CKD-EPI Creatinine Equation (2021)    Anion gap 8 5 - 15    Comment: Performed at [facility], [address] [place], [place] [address]  CBC     Status: Abnormal   Collection Time: 10/10/23  5:15 AM  Result Value Ref Range   WBC 11.9 (H) 4.0 - 10.5 K/uL   RBC 4.02 3.87 - 5.11 MIL/uL   Hemoglobin 8.8 (L) 12.0 - 15.0 g/dL    Comment: Reticulocyte Hemoglobin testing may be clinically indicated, consider ordering this additional test [id number]    HCT 29.7 (L) 36.0 - 46.0 %   MCV 73.9 (L) 80.0 - 100.0 fL   MCH 21.9 (L) 26.0 - 34.0 pg   MCHC 29.6 (L) 30.0 - 36.0 g/dL   RDW 01.0 (H) 27.2 - 53.6 %   Platelets 368 150 - 400 K/uL   nRBC 0.0 0.0 - 0.2 %    Comment: Performed at [facility], [address], [place], [place] [address]    No results found.   Assessment & Plan:  ALIYANNA WASSMER is a 83 y.o. female with a history of colonic polyps, anemia and a recent colonoscopy which demonstrated a spiral mass in the ascending colon.  -Colon mass: Assessment: Colonic spiral mass was found at site of previous incomplete 38 mm polyp resection, which along with painless occult blood and anemia are consistent with colon cancer.  Plan: Consult patient regarding cancer staging and surgical/medical options, including radiation therapy,  colectomy, lymph node resection and chemotherapeutic agents, as well as diagnostic modalities such as CT/MRI/PET scan to assess for metastasis.     Neomia Dear Paul 10/10/2023, 7:49 AM

## 2023-10-10 NOTE — Anesthesia Preprocedure Evaluation (Signed)
 Anesthesia Evaluation  Patient identified by MRN, date of birth, ID band Patient awake    Reviewed: Allergy & Precautions, H&P , NPO status , Patient's Chart, lab work & pertinent test results, reviewed documented beta blocker date and time   Airway Mallampati: II  TM Distance: >3 FB Neck ROM: full    Dental  (+) Dental Advisory Given, Missing,    Pulmonary neg pulmonary ROS   Pulmonary exam normal breath sounds clear to auscultation       Cardiovascular Exercise Tolerance: Good hypertension, Normal cardiovascular exam Rhythm:regular Rate:Normal     Neuro/Psych  PSYCHIATRIC DISORDERS     Dementia negative neurological ROS     GI/Hepatic negative GI ROS, Neg liver ROS,,,  Endo/Other  Hypothyroidism    Renal/GU negative Renal ROS  negative genitourinary   Musculoskeletal  (+) Arthritis , Osteoarthritis,    Abdominal   Peds  Hematology  (+) Blood dyscrasia, anemia Hgb 8.8   Anesthesia Other Findings   Reproductive/Obstetrics negative OB ROS                             Anesthesia Physical Anesthesia Plan  ASA: 3  Anesthesia Plan: General   Post-op Pain Management: Dilaudid IV   Induction: Intravenous  PONV Risk Score and Plan: 3 and Ondansetron, Dexamethasone and Midazolam  Airway Management Planned: Oral ETT  Additional Equipment: None  Intra-op Plan:   Post-operative Plan: Extubation in OR  Informed Consent: I have reviewed the patients History and Physical, chart, labs and discussed the procedure including the risks, benefits and alternatives for the proposed anesthesia with the patient or authorized representative who has indicated his/her understanding and acceptance.     Dental Advisory Given  Plan Discussed with: CRNA  Anesthesia Plan Comments:         Anesthesia Quick Evaluation

## 2023-10-10 NOTE — H&P (View-Only) (Signed)
 I was present with the medical student for this service. I personally verified the history of present illness, performed the physical exam, and made the plan for this encounter. I have verified the medical student's documentation and made modifications where appropriately. I have personally documented in my own words a brief history, physical, and plan below.     Patient with ascending colon mass/ cancer found on colonoscopy. Bleeding and H&H drifting back down. Came in weak and anemic. Bowel preparation completed. Will proceed with surgeyr tomorrow.   Discussed robotic assisted laparoscopic right hemicolectomy and risk of bleeding, infection, anastomotic leak, injury to other organs, injury to ureter.   Discussed open case if unable to do laparoscopic.   Discussed potentially needing more blood, post op course of 4-5 days, possible need for rehab.   Antibiotic prep today Clear diet  ERAS preop ordered Discussed with her and son. CT  today to further assess mass and location, any extension   Algis Greenhouse, MD [facility] [address] [place]ella [place], [place] [address] [phone] (office)   [facility] Consult  Reason for Consult: Abnormal labs and ascending colon spiral mass Referring Physician: Jena Gauss    HPI: Stacey Fitzgerald is a 83 y.o. female with a history of colonic polyps and microcytic anemia who was admitted on 3/18 for anemia. That day she received 2 units of blood, and the following day 3/19 was seen by GI who conducted upper endoscopy which was normal and colonoscopy which showed a mass in the ascending colon. Her son, who was present, noted she was unaware of the findings and waiting on the GI physician.  This morning she denies any abdominal pain or discomfort, dark, bloody stools or BRBPR. She denies fever, chills, lightheadedness, shortness of breath, but noted she needed help getting up to use the restroom this morning,  which is not usual. She said otherwise, she was feeling good and hoping to be discharged soon.  Past Medical History:  Diagnosis Date   Arthritis    Female cystocele    Hyperlipidemia    Hypertension    Hypothyroidism    Menopausal state    Obesity, unspecified    Pessary maintenance    Rectocele     Past Surgical History:  Procedure Laterality Date   BREAST EXCISIONAL BIOPSY Left 1972   benign   BREAST SURGERY     benign cyst   COLONOSCOPY     COLONOSCOPY WITH PROPOFOL N/A 11/23/2019   Procedure: COLONOSCOPY WITH PROPOFOL;  Surgeon: Toledo, Boykin Nearing, MD;  Location: [facility] ENDOSCOPY;  Service: Gastroenterology;  Laterality: N/A;   miscarrage     TUBAL LIGATION     uterine pessary      Family History  Problem Relation Age of Onset   Diabetes Mother    Seizures Mother    CVA Father    Dementia Brother    Diabetes Maternal Grandfather    Mental illness Sister    Breast cancer Neg Hx     Social History   Tobacco Use   Smoking status: Never   Smokeless tobacco: Never  Vaping Use   Vaping status: Never Used  Substance Use Topics   Alcohol use: Not Currently    Alcohol/week: 0.0 standard drinks of alcohol   Drug use: No    Medications: I have reviewed the patient's current medications. Current Facility-Administered Medications  Medication Dose Route Frequency Provider Last Rate Last Admin   acetaminophen (TYLENOL) tablet 650 mg  650 mg  Oral Q6H PRN Corbin Ade, MD   650 mg at 10/09/23 1123   Or   acetaminophen (TYLENOL) suppository 650 mg  650 mg Rectal Q6H PRN Corbin Ade, MD       alvimopan (ENTEREG) capsule 12 mg  12 mg Oral On Call to OR Lucretia Roers, MD       bisacodyl (DULCOLAX) EC tablet 20 mg  20 mg Oral Once Lucretia Roers, MD       bisacodyl (DULCOLAX) EC tablet 5 mg  5 mg Oral Daily PRN Rourk, Gerrit Friends, MD       Chlorhexidine Gluconate Cloth 2 % PADS 6 each  6 each Topical Once Lucretia Roers, MD       And   Chlorhexidine  Gluconate Cloth 2 % PADS 6 each  6 each Topical Once Lucretia Roers, MD       [START ON 10/11/2023] ertapenem [facility]) 1 g in sodium chloride 0.9 % 100 mL IVPB  1 g Intravenous On Call to OR Lucretia Roers, MD       [START ON 10/11/2023] feeding supplement (ENSURE PRE-SURGERY) liquid 296 mL  296 mL Oral Once Lucretia Roers, MD       feeding supplement (ENSURE PRE-SURGERY) liquid 592 mL  592 mL Oral Once Lucretia Roers, MD       ferric gluconate (FERRLECIT) 250 mg in sodium chloride 0.9 % 250 mL IVPB  250 mg Intravenous Once Corbin Ade, MD       ferrous sulfate tablet 325 mg  325 mg Oral BID WC Corbin Ade, MD   325 mg at 10/10/23 1610   hydrALAZINE (APRESOLINE) injection 10 mg  10 mg Intravenous Q4H PRN Corbin Ade, MD       HYDROmorphone (DILAUDID) injection 0.5-1 mg  0.5-1 mg Intravenous Q2H PRN Rourk, Gerrit Friends, MD       ipratropium (ATROVENT) nebulizer solution 0.5 mg  0.5 mg Nebulization Q6H PRN Rourk, Gerrit Friends, MD       levalbuterol Pauline Aus) nebulizer solution 0.63 mg  0.63 mg Nebulization Q6H PRN Corbin Ade, MD       lisinopril (ZESTRIL) tablet 20 mg  20 mg Oral BID Corbin Ade, MD   20 mg at 10/10/23 0849   neomycin (MYCIFRADIN) tablet 1,000 mg  1,000 mg Oral 3 times per day Lucretia Roers, MD       And   metroNIDAZOLE (FLAGYL) tablet 1,000 mg  1,000 mg Oral 3 times per day Lucretia Roers, MD       ondansetron [facility]) tablet 4 mg  4 mg Oral Q6H PRN Corbin Ade, MD       Or   ondansetron [facility]) injection 4 mg  4 mg Intravenous Q6H PRN Corbin Ade, MD       oxyCODONE (Oxy IR/ROXICODONE) immediate release tablet 5 mg  5 mg Oral Q4H PRN Corbin Ade, MD   5 mg at 10/09/23 2049   pantoprazole (PROTONIX) injection 40 mg  40 mg Intravenous Q12H Corbin Ade, MD   40 mg at 10/10/23 9604   senna-docusate (Senokot-S) tablet 1 tablet  1 tablet Oral QHS PRN Corbin Ade, MD       simvastatin (ZOCOR) tablet 20 mg  20 mg Oral QHS Corbin Ade, MD   20 mg at 10/09/23 2049   sodium chloride flush (NS) 0.9 % injection 3 mL  3 mL Intravenous Q12H  Corbin Ade, MD   3 mL at 10/10/23 1914   sodium chloride flush (NS) 0.9 % injection 3-10 mL  3-10 mL Intravenous PRN Rourk, Gerrit Friends, MD       sodium chloride flush (NS) 0.9 % injection 3-10 mL  3-10 mL Intravenous Q12H Ronelle Nigh, MD   10 mL at 10/10/23 0854   sodium chloride flush (NS) 0.9 % injection 3-10 mL  3-10 mL Intravenous PRN Ronelle Nigh, MD       sodium phosphate (FLEET) enema 1 enema  1 enema Rectal Once PRN Rourk, Gerrit Friends, MD       traZODone (DESYREL) tablet 25 mg  25 mg Oral QHS PRN Rourk, Gerrit Friends, MD        Allergies  Allergen Reactions   Cephalexin Hives    Patient reports that she is not allergic to this medication but she has not taken it since it was listed as an allergy     ROS:  Gastrointestinal: negative. Last BM yesterday morning. States she has not passed gas since Tuesday.  Blood pressure (!) 148/67, pulse 85, temperature 99.4 F (37.4 C), temperature source Oral, resp. rate 18, height 5\' 8"  (1.727 m), weight 66.2 kg, SpO2 99%. Physical Exam Constitutional:      Appearance: Normal appearance.  HENT:     Head: Normocephalic.     Nose: Nose normal.  Cardiovascular:     Rate and Rhythm: Normal rate.  Pulmonary:     Effort: Pulmonary effort is normal.  Abdominal:     General: Bowel sounds are increased. There is no distension.     Palpations: Abdomen is soft.     Tenderness: There is no abdominal tenderness (Deep palpation LLQ).  Musculoskeletal:        General: Normal range of motion.  Skin:    General: Skin is warm.  Neurological:     General: No focal deficit present.     Mental Status: She is alert.  Psychiatric:        Mood and Affect: Mood normal.     Results: Results for orders placed or performed during the hospital encounter of 10/08/23 (from the past 48 hours)  Comprehensive metabolic panel     Status: Abnormal    Collection Time: 10/08/23 10:24 AM  Result Value Ref Range   Sodium 138 135 - 145 mmol/L   Potassium 4.3 3.5 - 5.1 mmol/L   Chloride 107 98 - 111 mmol/L   CO2 21 (L) 22 - 32 mmol/L   Glucose, Bld 101 (H) 70 - 99 mg/dL    Comment: Glucose reference range applies only to samples taken after fasting for at least 8 hours.   BUN 21 8 - 23 mg/dL   Creatinine, Ser 7.82 (H) 0.44 - 1.00 mg/dL   Calcium 9.4 8.9 - 95.6 mg/dL   Total Protein 6.9 6.5 - 8.1 g/dL   Albumin 3.7 3.5 - 5.0 g/dL   AST 10 (L) 15 - 41 U/L   ALT 8 0 - 44 U/L   Alkaline Phosphatase 55 38 - 126 U/L   Total Bilirubin 0.4 0.0 - 1.2 mg/dL   GFR, Estimated 53 (L) >60 mL/min    Comment: (NOTE) Calculated using the CKD-EPI Creatinine Equation (2021)    Anion gap 10 5 - 15    Comment: Performed at [facility], [address] [place], [place] [address]  CBC with Differential     Status: Abnormal   Collection Time: 10/08/23 10:24 AM  Result Value Ref Range   WBC 9.5 4.0 - 10.5 K/uL   RBC 3.25 (L) 3.87 - 5.11 MIL/uL   Hemoglobin 6.4 (LL) 12.0 - 15.0 g/dL    Comment: REPEATED TO VERIFY Reticulocyte Hemoglobin testing may be clinically indicated, consider ordering this additional test [id number] THIS CRITICAL RESULT HAS VERIFIED AND BEEN CALLED TO M WHITE BY ANGELA WILSON ON 03 18 2025 AT 1050, AND HAS BEEN READ BACK.     HCT 22.7 (L) 36.0 - 46.0 %   MCV 69.8 (L) 80.0 - 100.0 fL   MCH 19.7 (L) 26.0 - 34.0 pg   MCHC 28.2 (L) 30.0 - 36.0 g/dL   RDW 54.0 (H) 98.1 - 19.1 %   Platelets 405 (H) 150 - 400 K/uL   nRBC 0.0 0.0 - 0.2 %   Neutrophils Relative % 74 %   Neutro Abs 7.0 1.7 - 7.7 K/uL   Lymphocytes Relative 15 %   Lymphs Abs 1.4 0.7 - 4.0 K/uL   Monocytes Relative 8 %   Monocytes Absolute 0.8 0.1 - 1.0 K/uL   Eosinophils Relative 2 %   Eosinophils Absolute 0.2 0.0 - 0.5 K/uL   Basophils Relative 1 %   Basophils Absolute 0.1 0.0 - 0.1 K/uL   WBC Morphology MORPHOLOGY UNREMARKABLE    RBC Morphology See Note      Comment: MICROCYTOSIS   Smear Review PLATELETS APPEAR INCREASED     Comment: PLATELET COUNT CONFIRMED BY SMEAR   Immature Granulocytes 0 %   Abs Immature Granulocytes 0.04 0.00 - 0.07 K/uL    Comment: Performed at [facility], [address] [place], [place] [address]  Protime-INR     Status: None   Collection Time: 10/08/23 10:24 AM  Result Value Ref Range   Prothrombin Time 13.4 11.4 - 15.2 seconds   INR 1.0 0.8 - 1.2    Comment: (NOTE) INR goal varies based on device and disease states. Performed at [facility], [address] [place], [place] [address]   Type and screen [facility]     Status: None   Collection Time: 10/08/23 10:24 AM  Result Value Ref Range   ABO/RH(D) B NEG    Antibody Screen NEG    Sample Expiration 10/11/2023,2359    Unit Number [id number]    Blood Component Type RED CELLS,LR    Unit division 00    Status of Unit ISSUED,FINAL    Transfusion Status OK TO TRANSFUSE    Crossmatch Result Compatible    Unit Number [id number]    Blood Component Type RED CELLS,LR    Unit division 00    Status of Unit ISSUED,FINAL    Transfusion Status OK TO TRANSFUSE    Crossmatch Result      Compatible Performed at [facility], [address], [place], [place] [address]   POC occult blood, ED RN will collect     Status: None   Collection Time: 10/08/23 10:37 AM  Result Value Ref Range   Occult Blood positive     Comment: abnormal  ABO/Rh     Status: None   Collection Time: 10/08/23 11:17 AM  Result Value Ref Range   ABO/RH(D)      B NEG Performed at [facility], [address] [place], [place] [address]   Vitamin B12     Status: None   Collection Time: 10/08/23 11:19 AM  Result Value Ref Range   Vitamin B-12 229 180 - 914 pg/mL    Comment: (NOTE)  This assay is not validated for testing neonatal or myeloproliferative syndrome specimens for Vitamin B12 levels. Performed at [facility], [address] [place], [place] [address]   Folate      Status: None   Collection Time: 10/08/23 11:19 AM  Result Value Ref Range   Folate 7.6 >5.9 ng/mL    Comment: Performed at [facility], [address], [place], [place] [address]  Iron and TIBC     Status: Abnormal   Collection Time: 10/08/23 11:19 AM  Result Value Ref Range   Iron 9 (L) 28 - 170 ug/dL   TIBC 644 034 - 742 ug/dL   Saturation Ratios 2 (L) 10.4 - 31.8 %   UIBC 359 ug/dL    Comment: Performed at [facility], [address], [place], [place] [address]  Ferritin     Status: Abnormal   Collection Time: 10/08/23 11:19 AM  Result Value Ref Range   Ferritin 4 (L) 11 - 307 ng/mL    Comment: Performed at [facility], [address] [place], [place] [address]  Reticulocytes     Status: Abnormal   Collection Time: 10/08/23 11:19 AM  Result Value Ref Range   Retic Ct Pct 0.9 0.4 - 3.1 %   RBC. 3.00 (L) 3.87 - 5.11 MIL/uL   Retic Count, Absolute 27.9 19.0 - 186.0 K/uL   Immature Retic Fract 14.0 2.3 - 15.9 %    Comment: Performed at [facility], [address], [place], [place] [address]  Prepare RBC (crossmatch)     Status: None   Collection Time: 10/08/23 11:19 AM  Result Value Ref Range   Order Confirmation      ORDER PROCESSED BY BLOOD BANK Performed at [facility], [address] [place], [place] [address]   Magnesium     Status: None   Collection Time: 10/08/23 11:19 AM  Result Value Ref Range   Magnesium 1.9 1.7 - 2.4 mg/dL    Comment: Performed at [facility], [address], [place], [place] [address]  Phosphorus     Status: None   Collection Time: 10/08/23 11:19 AM  Result Value Ref Range   Phosphorus 3.2 2.5 - 4.6 mg/dL    Comment: Performed at [facility], [address], [place], [place] [address]  TSH     Status: None   Collection Time: 10/08/23 11:19 AM  Result Value Ref Range   TSH 2.500 0.350 - 4.500 uIU/mL    Comment: Performed by a 3rd Generation assay with a functional sensitivity of <=0.01 uIU/mL. Performed at [facility], [address]  [place], [place] [address]   Basic metabolic panel     Status: Abnormal   Collection Time: 10/09/23  4:49 AM  Result Value Ref Range   Sodium 146 (H) 135 - 145 mmol/L    Comment: DELTA CHECK NOTED   Potassium 4.0 3.5 - 5.1 mmol/L   Chloride 116 (H) 98 - 111 mmol/L   CO2 22 22 - 32 mmol/L   Glucose, Bld 107 (H) 70 - 99 mg/dL    Comment: Glucose reference range applies only to samples taken after fasting for at least 8 hours.   BUN 17 8 - 23 mg/dL   Creatinine, Ser 5.57 0.44 - 1.00 mg/dL   Calcium 9.4 8.9 - 32.2 mg/dL   GFR, Estimated 59 (L) >60 mL/min    Comment: (NOTE) Calculated using the CKD-EPI Creatinine Equation (2021)    Anion gap 8 5 - 15    Comment:  Performed at [facility], [address], [place], [place] [address]  CBC     Status: Abnormal   Collection Time: 10/09/23  4:49 AM  Result Value Ref Range   WBC 10.1 4.0 - 10.5 K/uL   RBC 4.37 3.87 - 5.11 MIL/uL   Hemoglobin 9.4 (L) 12.0 - 15.0 g/dL    Comment: POST TRANSFUSION SPECIMEN   HCT 32.3 (L) 36.0 - 46.0 %   MCV 73.9 (L) 80.0 - 100.0 fL   MCH 21.5 (L) 26.0 - 34.0 pg   MCHC 29.1 (L) 30.0 - 36.0 g/dL   RDW 60.4 (H) 54.0 - 98.1 %   Platelets 436 (H) 150 - 400 K/uL   nRBC 0.0 0.0 - 0.2 %    Comment: Performed at [facility], [address], [place], [place] [address]  Protime-INR     Status: None   Collection Time: 10/09/23  4:49 AM  Result Value Ref Range   Prothrombin Time 14.1 11.4 - 15.2 seconds   INR 1.1 0.8 - 1.2    Comment: (NOTE) INR goal varies based on device and disease states. Performed at [facility], [address] [place], [place] [address]   APTT     Status: None   Collection Time: 10/09/23  4:49 AM  Result Value Ref Range   aPTT 28 24 - 36 seconds    Comment: Performed at [facility], [address] [place], [place] [address]  Glucose, capillary     Status: None   Collection Time: 10/09/23  7:58 AM  Result Value Ref Range   Glucose-Capillary 97 70 - 99 mg/dL    Comment: Glucose reference  range applies only to samples taken after fasting for at least 8 hours.   Comment 1 Notify RN    Comment 2 Document in Chart   Basic metabolic panel     Status: Abnormal   Collection Time: 10/10/23  5:15 AM  Result Value Ref Range   Sodium 141 135 - 145 mmol/L   Potassium 3.8 3.5 - 5.1 mmol/L   Chloride 110 98 - 111 mmol/L   CO2 23 22 - 32 mmol/L   Glucose, Bld 106 (H) 70 - 99 mg/dL    Comment: Glucose reference range applies only to samples taken after fasting for at least 8 hours.   BUN 12 8 - 23 mg/dL   Creatinine, Ser 6.57 0.44 - 1.00 mg/dL   Calcium 9.0 8.9 - 84.6 mg/dL   GFR, Estimated 59 (L) >60 mL/min    Comment: (NOTE) Calculated using the CKD-EPI Creatinine Equation (2021)    Anion gap 8 5 - 15    Comment: Performed at [facility], [address] [place], [place] [address]  CBC     Status: Abnormal   Collection Time: 10/10/23  5:15 AM  Result Value Ref Range   WBC 11.9 (H) 4.0 - 10.5 K/uL   RBC 4.02 3.87 - 5.11 MIL/uL   Hemoglobin 8.8 (L) 12.0 - 15.0 g/dL    Comment: Reticulocyte Hemoglobin testing may be clinically indicated, consider ordering this additional test [id number]    HCT 29.7 (L) 36.0 - 46.0 %   MCV 73.9 (L) 80.0 - 100.0 fL   MCH 21.9 (L) 26.0 - 34.0 pg   MCHC 29.6 (L) 30.0 - 36.0 g/dL   RDW 01.0 (H) 27.2 - 53.6 %   Platelets 368 150 - 400 K/uL   nRBC 0.0 0.0 - 0.2 %    Comment: Performed at [facility], [address], [place], [place] [address]    No results found.   Assessment & Plan:  Stacey Fitzgerald is a 83 y.o. female with a history of colonic polyps, anemia and a recent colonoscopy which demonstrated a spiral mass in the ascending colon.  -Colon mass: Assessment: Colonic spiral mass was found at site of previous incomplete 38 mm polyp resection, which along with painless occult blood and anemia are consistent with colon cancer.  Plan: Consult patient regarding cancer staging and surgical/medical options, including radiation therapy,  colectomy, lymph node resection and chemotherapeutic agents, as well as diagnostic modalities such as CT/MRI/PET scan to assess for metastasis.     Neomia Dear Paul 10/10/2023, 7:49 AM

## 2023-10-10 NOTE — Plan of Care (Signed)

## 2023-10-10 NOTE — Plan of Care (Signed)
   Problem: Education: Goal: Knowledge of General Education information will improve Description Including pain rating scale, medication(s)/side effects and non-pharmacologic comfort measures Outcome: Progressing

## 2023-10-10 NOTE — Progress Notes (Signed)
 Upon this RN's shift assessment she is resting in her bed. Respirations equal and unlabored. Pt is scheduled for hemicolectomy tomorrow. She states " this is the last time I do this. I won't do it again, I'd rather die". Pt reports she is tired and would like to sleep. She is not endorsing SI or HI but she is sad and withdrawn. Her [birth year] is [birth year]. She denies further needs at this time. Kellogg RN

## 2023-10-10 NOTE — Progress Notes (Signed)
 Pt had a restful night. She is hopeful discharge today.  No acute events overnight. Kellogg RN

## 2023-10-11 ENCOUNTER — Other Ambulatory Visit: Payer: Self-pay

## 2023-10-11 ENCOUNTER — Encounter (HOSPITAL_COMMUNITY)
Admission: EM | Disposition: A | Discharge status: Skilled Nursing Facility | Payer: Self-pay | Source: Home / Self Care | Attending: Internal Medicine

## 2023-10-11 ENCOUNTER — Telehealth: Payer: Self-pay | Admitting: Gastroenterology

## 2023-10-11 ENCOUNTER — Inpatient Hospital Stay (HOSPITAL_COMMUNITY): Admitting: Certified Registered Nurse Anesthetist

## 2023-10-11 ENCOUNTER — Encounter (HOSPITAL_COMMUNITY): Payer: Self-pay | Admitting: Internal Medicine

## 2023-10-11 DIAGNOSIS — D649 Anemia, unspecified: Secondary | ICD-10-CM | POA: Diagnosis not present

## 2023-10-11 DIAGNOSIS — C182 Malignant neoplasm of ascending colon: Secondary | ICD-10-CM | POA: Diagnosis not present

## 2023-10-11 DIAGNOSIS — K6389 Other specified diseases of intestine: Secondary | ICD-10-CM

## 2023-10-11 DIAGNOSIS — E039 Hypothyroidism, unspecified: Secondary | ICD-10-CM | POA: Diagnosis not present

## 2023-10-11 DIAGNOSIS — I10 Essential (primary) hypertension: Secondary | ICD-10-CM

## 2023-10-11 LAB — COMPREHENSIVE METABOLIC PANEL
ALT: 9 U/L (ref 0–44)
AST: 10 U/L — ABNORMAL LOW (ref 15–41)
Albumin: 3.1 g/dL — ABNORMAL LOW (ref 3.5–5.0)
Alkaline Phosphatase: 49 U/L (ref 38–126)
Anion gap: 10 (ref 5–15)
BUN: 16 mg/dL (ref 8–23)
CO2: 20 mmol/L — ABNORMAL LOW (ref 22–32)
Calcium: 8.1 mg/dL — ABNORMAL LOW (ref 8.9–10.3)
Chloride: 103 mmol/L (ref 98–111)
Creatinine, Ser: 1.16 mg/dL — ABNORMAL HIGH (ref 0.44–1.00)
GFR, Estimated: 47 mL/min — ABNORMAL LOW (ref >60)
Glucose, Bld: 87 mg/dL (ref 70–99)
Potassium: 3.4 mmol/L — ABNORMAL LOW (ref 3.5–5.1)
Sodium: 133 mmol/L — ABNORMAL LOW (ref 135–145)
Total Bilirubin: 0.7 mg/dL (ref 0.0–1.2)
Total Protein: 6.2 g/dL — ABNORMAL LOW (ref 6.5–8.1)

## 2023-10-11 LAB — CBC
HCT: 29.7 % — ABNORMAL LOW (ref 36.0–46.0)
Hemoglobin: 8.5 g/dL — ABNORMAL LOW (ref 12.0–15.0)
MCH: 21.4 pg — ABNORMAL LOW (ref 26.0–34.0)
MCHC: 28.6 g/dL — ABNORMAL LOW (ref 30.0–36.0)
MCV: 74.6 fL — ABNORMAL LOW (ref 80.0–100.0)
Platelets: 348 10*3/uL (ref 150–400)
RBC: 3.98 MIL/uL (ref 3.87–5.11)
RDW: 19.6 % — ABNORMAL HIGH (ref 11.5–15.5)
WBC: 12.5 10*3/uL — ABNORMAL HIGH (ref 4.0–10.5)
nRBC: 0 % (ref 0.0–0.2)

## 2023-10-11 LAB — PROTIME-INR
INR: 1.3 — ABNORMAL HIGH (ref 0.8–1.2)
Prothrombin Time: 15.9 s — ABNORMAL HIGH (ref 11.4–15.2)

## 2023-10-11 LAB — URINALYSIS, ROUTINE W REFLEX MICROSCOPIC
Bilirubin Urine: NEGATIVE
Glucose, UA: NEGATIVE mg/dL
Ketones, ur: NEGATIVE mg/dL
Nitrite: NEGATIVE
Protein, ur: 30 mg/dL — AB
Specific Gravity, Urine: >1.046 — ABNORMAL HIGH (ref 1.005–1.030)
pH: 5 (ref 5.0–8.0)

## 2023-10-11 LAB — MRSA NEXT GEN BY PCR, NASAL: MRSA by PCR Next Gen: NOT DETECTED

## 2023-10-11 LAB — SURGICAL PATHOLOGY

## 2023-10-11 LAB — MAGNESIUM: Magnesium: 1.7 mg/dL (ref 1.7–2.4)

## 2023-10-11 SURGERY — COLECTOMY, RIGHT, ROBOT-ASSISTED
Anesthesia: General | Site: Abdomen | Laterality: Right

## 2023-10-11 MED ORDER — LACTATED RINGERS IV SOLN: INTRAVENOUS | Status: DC

## 2023-10-11 MED ORDER — ACETAMINOPHEN 500 MG PO TABS
1000.0000 mg | ORAL_TABLET | Freq: Four times a day (QID) | ORAL | Status: DC
  Administered 2023-10-11 – 2023-10-16 (×10): 1000 mg via ORAL
  Administered 2023-10-17: 500 mg via ORAL
  Filled 2023-10-11 (×14): qty 2

## 2023-10-11 MED ORDER — BUPIVACAINE HCL (PF) 0.5 % IJ SOLN
INTRAMUSCULAR | Status: AC
  Filled 2023-10-11: qty 30

## 2023-10-11 MED ORDER — SODIUM CHLORIDE 0.9 % IV SOLN: 12.5000 mg | INTRAVENOUS | Status: DC | PRN

## 2023-10-11 MED ORDER — ACETAMINOPHEN 10 MG/ML IV SOLN
INTRAVENOUS | Status: DC | PRN
  Administered 2023-10-11: 1000 mg via INTRAVENOUS

## 2023-10-11 MED ORDER — FENTANYL CITRATE (PF) 250 MCG/5ML IJ SOLN
INTRAMUSCULAR | Status: AC
  Filled 2023-10-11: qty 5

## 2023-10-11 MED ORDER — ROCURONIUM BROMIDE 10 MG/ML (PF) SYRINGE
PREFILLED_SYRINGE | INTRAVENOUS | Status: AC
  Filled 2023-10-11: qty 10

## 2023-10-11 MED ORDER — SODIUM CHLORIDE 0.9% FLUSH: 3.0000 mL | INTRAVENOUS | Status: DC | PRN

## 2023-10-11 MED ORDER — ONDANSETRON HCL 4 MG/2ML IJ SOLN
INTRAMUSCULAR | Status: AC
  Filled 2023-10-11: qty 2

## 2023-10-11 MED ORDER — SUGAMMADEX SODIUM 200 MG/2ML IV SOLN
INTRAVENOUS | Status: AC
  Filled 2023-10-11: qty 2

## 2023-10-11 MED ORDER — LIDOCAINE HCL (CARDIAC) PF 100 MG/5ML IV SOSY
PREFILLED_SYRINGE | INTRAVENOUS | Status: DC | PRN
  Administered 2023-10-11: 60 mg via INTRATRACHEAL

## 2023-10-11 MED ORDER — SODIUM CHLORIDE 0.9 % IV SOLN
1.0000 g | Freq: Once | INTRAVENOUS | Status: AC
  Administered 2023-10-12: 1 g via INTRAVENOUS
  Filled 2023-10-11: qty 1000

## 2023-10-11 MED ORDER — ACETAMINOPHEN 10 MG/ML IV SOLN
INTRAVENOUS | Status: AC
  Filled 2023-10-11: qty 100

## 2023-10-11 MED ORDER — HYDROMORPHONE HCL 1 MG/ML IJ SOLN
0.2500 mg | INTRAMUSCULAR | Status: DC | PRN
  Administered 2023-10-11 (×2): 0.5 mg via INTRAVENOUS
  Filled 2023-10-11 (×2): qty 0.5

## 2023-10-11 MED ORDER — PROPOFOL 10 MG/ML IV BOLUS
INTRAVENOUS | Status: AC
  Filled 2023-10-11: qty 20

## 2023-10-11 MED ORDER — LIDOCAINE HCL (PF) 2 % IJ SOLN
INTRAMUSCULAR | Status: AC
  Filled 2023-10-11: qty 5

## 2023-10-11 MED ORDER — ONDANSETRON HCL 4 MG/2ML IJ SOLN
INTRAMUSCULAR | Status: DC | PRN
  Administered 2023-10-11: 4 mg via INTRAVENOUS

## 2023-10-11 MED ORDER — PHENYLEPHRINE HCL-NACL 20-0.9 MG/250ML-% IV SOLN
INTRAVENOUS | Status: AC
  Filled 2023-10-11: qty 250

## 2023-10-11 MED ORDER — SUCCINYLCHOLINE CHLORIDE 200 MG/10ML IV SOSY
PREFILLED_SYRINGE | INTRAVENOUS | Status: DC | PRN
  Administered 2023-10-11: 120 mg via INTRAVENOUS

## 2023-10-11 MED ORDER — PROPOFOL 10 MG/ML IV BOLUS
INTRAVENOUS | Status: DC | PRN
  Administered 2023-10-11: 80 mg via INTRAVENOUS

## 2023-10-11 MED ORDER — SUGAMMADEX SODIUM 200 MG/2ML IV SOLN
INTRAVENOUS | Status: DC | PRN
  Administered 2023-10-11: 200 mg via INTRAVENOUS

## 2023-10-11 MED ORDER — CHLORHEXIDINE GLUCONATE 0.12 % MT SOLN
15.0000 mL | Freq: Once | OROMUCOSAL | Status: AC
  Administered 2023-10-11: 15 mL via OROMUCOSAL

## 2023-10-11 MED ORDER — STERILE WATER FOR IRRIGATION IR SOLN
Status: DC | PRN
  Administered 2023-10-11: 500 mL

## 2023-10-11 MED ORDER — SUCCINYLCHOLINE CHLORIDE 200 MG/10ML IV SOSY
PREFILLED_SYRINGE | INTRAVENOUS | Status: AC
  Filled 2023-10-11: qty 10

## 2023-10-11 MED ORDER — DEXAMETHASONE SODIUM PHOSPHATE 10 MG/ML IJ SOLN
INTRAMUSCULAR | Status: AC
  Filled 2023-10-11: qty 1

## 2023-10-11 MED ORDER — SODIUM CHLORIDE 0.9% FLUSH
3.0000 mL | Freq: Two times a day (BID) | INTRAVENOUS | Status: DC
  Administered 2023-10-11: 10 mL via INTRAVENOUS

## 2023-10-11 MED ORDER — ORAL CARE MOUTH RINSE: 15.0000 mL | Freq: Once | OROMUCOSAL | Status: AC

## 2023-10-11 MED ORDER — BUPIVACAINE HCL 0.5 % IJ SOLN
INTRAMUSCULAR | Status: DC | PRN
  Administered 2023-10-11: 30 mL

## 2023-10-11 MED ORDER — 0.9 % SODIUM CHLORIDE (POUR BTL) OPTIME
TOPICAL | Status: DC | PRN
  Administered 2023-10-11: 1000 mL

## 2023-10-11 MED ORDER — ROCURONIUM BROMIDE 10 MG/ML (PF) SYRINGE
PREFILLED_SYRINGE | INTRAVENOUS | Status: DC | PRN
  Administered 2023-10-11: 50 mg via INTRAVENOUS
  Administered 2023-10-11 (×2): 10 mg via INTRAVENOUS

## 2023-10-11 MED ORDER — FENTANYL CITRATE (PF) 100 MCG/2ML IJ SOLN
INTRAMUSCULAR | Status: DC | PRN
  Administered 2023-10-11 (×5): 50 ug via INTRAVENOUS

## 2023-10-11 MED ORDER — ALVIMOPAN 12 MG PO CAPS
12.0000 mg | ORAL_CAPSULE | Freq: Two times a day (BID) | ORAL | Status: DC
  Administered 2023-10-12: 12 mg via ORAL
  Filled 2023-10-11 (×2): qty 1

## 2023-10-11 MED ORDER — DEXAMETHASONE SODIUM PHOSPHATE 10 MG/ML IJ SOLN
INTRAMUSCULAR | Status: DC | PRN
  Administered 2023-10-11: 10 mg via INTRAVENOUS

## 2023-10-11 SURGICAL SUPPLY — 68 items
CANNULA REDUCER 12-8 DVNC XI (CANNULA) ×1 IMPLANT
COUNTER NDL MAGNETIC 40 RED (SET/KITS/TRAYS/PACK) IMPLANT
COUNTER NEEDLE MAGNETIC 40 RED (SET/KITS/TRAYS/PACK) ×1 IMPLANT
COVER TIP SHEARS 8 DVNC (MISCELLANEOUS) ×1 IMPLANT
DERMABOND ADVANCED .7 DNX12 (GAUZE/BANDAGES/DRESSINGS) ×1 IMPLANT
DRAPE ARM DVNC X/XI (DISPOSABLE) ×4 IMPLANT
DRAPE COLUMN DVNC XI (DISPOSABLE) ×1 IMPLANT
DRSG OPSITE POSTOP 4X8 (GAUZE/BANDAGES/DRESSINGS) IMPLANT
DRSG TEGADERM 2-3/8X2-3/4 SM (GAUZE/BANDAGES/DRESSINGS) ×4 IMPLANT
ELECT REM PT RETURN 9FT ADLT (ELECTROSURGICAL) ×1 IMPLANT
ELECTRODE REM PT RTRN 9FT ADLT (ELECTROSURGICAL) ×1 IMPLANT
FORCEPS BPLR 8 MD DVNC XI (FORCEP) ×1 IMPLANT
GAUZE SPONGE 2X2 STRL 8-PLY (GAUZE/BANDAGES/DRESSINGS) ×4 IMPLANT
GAUZE SPONGE 4X4 12PLY STRL (GAUZE/BANDAGES/DRESSINGS) IMPLANT
GLOVE BIO SURGEON STRL SZ 6.5 (GLOVE) ×3 IMPLANT
GLOVE BIO SURGEON STRL SZ7 (GLOVE) ×5 IMPLANT
GLOVE BIOGEL PI IND STRL 6.5 (GLOVE) ×3 IMPLANT
GLOVE BIOGEL PI IND STRL 7.0 (GLOVE) ×6 IMPLANT
GOWN STRL REUS W/TWL LRG LVL3 (GOWN DISPOSABLE) ×7 IMPLANT
GRASPER SUT TROCAR 14GX15 (MISCELLANEOUS) IMPLANT
GRASPER TIP-UP FEN DVNC XI (INSTRUMENTS) ×1 IMPLANT
KIT PINK PAD W/HEAD ARE REST (MISCELLANEOUS) ×1 IMPLANT
KIT PINK PAD W/HEAD ARM REST (MISCELLANEOUS) ×1 IMPLANT
LIGASURE IMPACT 36 18CM CVD LR (INSTRUMENTS) ×1 IMPLANT
MANIFOLD NEPTUNE II (INSTRUMENTS) ×1 IMPLANT
NDL HYPO 21X1.5 SAFETY (NEEDLE) ×1 IMPLANT
NDL INSUFFLATION 14GA 120MM (NEEDLE) ×1 IMPLANT
NEEDLE HYPO 21X1.5 SAFETY (NEEDLE) ×1 IMPLANT
NEEDLE INSUFFLATION 14GA 120MM (NEEDLE) ×1 IMPLANT
NS IRRIG 500ML POUR BTL (IV SOLUTION) ×1 IMPLANT
OBTURATOR OPTICAL STND 8 DVNC (TROCAR) ×1 IMPLANT
OBTURATOR OPTICALSTD 8 DVNC (TROCAR) ×1 IMPLANT
PACK COLON (CUSTOM PROCEDURE TRAY) ×1 IMPLANT
POSITIONER HEAD 8X9X4 ADT (SOFTGOODS) ×1 IMPLANT
RELOAD PROXIMATE 75MM BLUE (ENDOMECHANICALS) ×2 IMPLANT
RELOAD STAPLE 60 2.5 WHT DVNC (STAPLE) ×1 IMPLANT
RELOAD STAPLE 60 3.5 BLU DVNC (STAPLE) IMPLANT
RELOAD STAPLE 75 3.8 BLU REG (ENDOMECHANICALS) IMPLANT
RELOAD STAPLER 2.5X60 WHT DVNC (STAPLE) ×1 IMPLANT
RELOAD STAPLER 3.5X60 BLU DVNC (STAPLE) ×1 IMPLANT
RETRACTOR WOUND ALXS 18CM MED (MISCELLANEOUS) IMPLANT
RTRCTR WOUND ALEXIS O 18CM MED (MISCELLANEOUS) ×1 IMPLANT
SCISSORS MNPLR CVD DVNC XI (INSTRUMENTS) ×1 IMPLANT
SEAL UNIV 5-12 XI (MISCELLANEOUS) ×4 IMPLANT
SEALER VESSEL EXT DVNC XI (MISCELLANEOUS) IMPLANT
SLEEVE ENDOPATH XCEL 5M (ENDOMECHANICALS) ×1 IMPLANT
SOL ANTI FOG 6CC (MISCELLANEOUS) ×1 IMPLANT
SPONGE T-LAP 18X18 ~~LOC~~+RFID (SPONGE) ×1 IMPLANT
STAPLER 60 SUREFORM DVNC (STAPLE) ×1 IMPLANT
STAPLER GUN LINEAR PROX 60 (STAPLE) ×1 IMPLANT
STAPLER PROXIMATE 75MM BLUE (STAPLE) IMPLANT
STAPLER RELOAD 2.5X60 WHT DVNC (STAPLE) ×1 IMPLANT
STAPLER RELOAD 3.5X60 BLU DVNC (STAPLE) ×1 IMPLANT
STAPLER VISISTAT (STAPLE) ×1 IMPLANT
SUT CHROMIC 2 0 SH (SUTURE) IMPLANT
SUT MNCRL AB 4-0 PS2 18 (SUTURE) ×1 IMPLANT
SUT PDS AB 0 CT1 36 (SUTURE) ×2 IMPLANT
SUT SILK 2 0 SH (SUTURE) ×2 IMPLANT
SUT SILK 3 0 SH CR/8 (SUTURE) ×1 IMPLANT
SUT STRATA 2-0 23CM CT-2 (SUTURE) ×2 IMPLANT
SUT STRATA 3-0 15 PS-2 (SUTURE) ×1 IMPLANT
SUT VICRYL 0 UR6 27IN ABS (SUTURE) IMPLANT
SYR 10ML LL (SYRINGE) IMPLANT
SYR 30ML LL (SYRINGE) ×1 IMPLANT
TRAY FOLEY MTR SLVR 16FR STAT (SET/KITS/TRAYS/PACK) ×1 IMPLANT
TUBE CONNECTING 12X1/4 (SUCTIONS) IMPLANT
WATER STERILE IRR 500ML POUR (IV SOLUTION) ×1 IMPLANT
YANKAUER SUCT BULB TIP 10FT TU (MISCELLANEOUS) IMPLANT

## 2023-10-11 NOTE — Telephone Encounter (Signed)
 She has been followed by our inpatient team for anemia.  Colonoscopy revealed a spiral shaped simulator/some apple core neoplasm approximately 10-15 cm distal to the ileocecal valve.  Biopsies were obtained.  Pathology has resulted today of sample from colonoscopy on Wednesday.  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  Patient underwent right-sided hemicolectomy today with anastomosis of ileum to transverse colon.  Please send ASAP referral to oncology for patient and notify her son. Dx: ascending colon cancer  Brooke Bonito, MSN, APRN, FNP-BC, AGACNP-BC [facility] at [facility]

## 2023-10-11 NOTE — Plan of Care (Signed)

## 2023-10-11 NOTE — Anesthesia Procedure Notes (Signed)
 Procedure Name: Intubation Date/Time: 10/11/2023 7:39 AM  Performed by: Lorin Glass, CRNAPre-anesthesia Checklist: Patient identified, Emergency Drugs available, Suction available and Patient being monitored Patient Re-evaluated:Patient Re-evaluated prior to induction Oxygen Delivery Method: Circle system utilized Preoxygenation: Pre-oxygenation with 100% oxygen Induction Type: IV induction Ventilation: Mask ventilation without difficulty Laryngoscope Size: Glidescope and 3 Grade View: Grade II Tube type: Oral Tube size: 7.0 mm Number of attempts: 1 Airway Equipment and Method: Stylet Placement Confirmation: ETT inserted through vocal cords under direct vision, positive ETCO2 and breath sounds checked- equal and bilateral Secured at: 20 cm Tube secured with: Tape Dental Injury: Teeth and Oropharynx as per pre-operative assessment

## 2023-10-11 NOTE — Op Note (Signed)
 [facility] Operative Note  10/11/23  Preoperative diagnosis: Ascending colon mass, symptomatic anemia   Postoperative diagnosis: Same  Procedure: Robotic assisted laparoscopic right colectomy   Anesthesia: GETA    Surgeon: Dr. Algis Greenhouse, MD   Assistant: No qualified resident available    Wound Classification: Clean contaminated   Specimen: Right colon   Complications: None    Estimated Blood Loss: 50cc   Indications: Patient is a 83 yo who  presented with an ascending colon mass noted on colonoscopy after coming in with symptomatic anemia.  Please see H&P for further details.     FIndings: 1.  Ascending colon mass with tattoo 2.  Adequate hemostasis.  4.  No gross metastasis noted  Operation performed with curative intent:Yes  Tumor Location:Ascending colon    Description of procedure: The patient was placed on the operating table in the supine position. General anesthesia was induced. Both arms were tucked and all pressure points padded..  A time-out was completed verifying correct patient, procedure, site, positioning, and implant(s) and/or special equipment prior to beginning this procedure. An oral gastric tube was placed. A foley catheter was placed.  The table was flexed to open up the abdomen for port placement. The table was placed in the reverse Trendelenburg position with the right side elevated. The abdomen was prepped and draped in the usual sterile fashion.    At the supraumbilical area an Veress needle was inserted while elevating her very lax abdominal wall.  Positioning intraabdominal was confirmed with a positive saline drop test, and gas insufflation was initiated until the abdominal pressure was measured at 15 mmHg. Initially I insufflated the subcutaneous tissue but was able to get intraperitoneal.  Afterwards, the Veress needle was removed and a 8 mm port was placed through the same site using Optiview technique after extending the  incision. No injuries were noted. There was some preperitoneal gas noted from the initial insufflation.   Four additional incision was made along the left side of the abdominal wall from the initial incision all under direct visualization. A left upper abdomen an 8  then exchanged to a 12mm port placed under direct visualization and then moving inferiorly trying to allow for at least 4 cm between the ports three additional 8 mm ports were placed under direct visualization along a semi linear line that ran from Palmar's point down to the suprapubic region, spacing them evening down this line.   The small bowel was dropped out of the operative field.  The robotic platform was then brought to the operative field and docked.  Tip up grasper was placed in arm 4 and Vessel sealer was placed in arm 3.  Forced bipolar in arm 1.    Examination of the abdominal cavity noted no signs of gross metastasis.  The ascending colon with inking of the tattoo was noted.    Dissection was started by removing the lateral attachments of the right colon along the white line of Toldt, ensuring the right ureter was not involved.  This was carried around the hepatic flexure to the mid portion of the transverse colon.  Afterwards, the right colon was grasped and elevated to visualize mesentary. Point was chosen on the transverse colon for staple transection, measuring at least 5 cm from the mass.  60mm blue load stapler was then used to transect the colon at this point.  Vessel sealer was then used to transect the right colon mesentery towards a previously determined point on the terminal ileum,  care taken to ensure as much mesentery was taken for lymph node evaluation, and also visualizing the duodenum and placing it away from area of transection during this portion. The right ureter and duodenum were protected the entire time. Once the terminal ileum was reached, 60mm white load stapler was used to transect the terminal ileum.  The  specimen was completely detached.   The 12 mm trocar was removed and closed with PMI using a 0 Vicryl suture under direct visualization The other trocars were removed under direct visualization. A small midline extraction site was made around the umbilicus. This was carried down to the fascia and the abdomen was entered. Insufflation was evacuated. A small wound protector was placed. The specimen was removed and passed off the field.  Towels were laid out.  The transverse colon and ileum were brought into the field side by side and two antimesenteric enterotomies were made. No twisting was noted. A 75 mm linear cutting stapler was used to create a common channel. The common colotomy was closed with a TA 60 stapler. Two crotch sutures were placed with 3-0 Silk and the staple line was covered with omental fat that was secured in place with 3-0 Silk suture. The mesenteric defect was closed with 3-0 Chromic gut.  Irrigation was performed. Hemostasis was achieved.   Marcaine was infused at all port and extraction sites. The midline was closed with 0 PDS in the standard fashion. All skin incisions then closed with staples.  Wounds were dressed with a honeycomb and gauze/ tegaderm dressings.    The patient tolerated the procedure well, awakened from anesthesia and was taken to the postanesthesia care unit in satisfactory condition.  The foley was removed and the OG was removed.  Sponge count and instrument count correct at the end of the procedure.  Algis Greenhouse, MD [facility] [address] [place], [place] [address] [phone] (office)

## 2023-10-11 NOTE — Anesthesia Postprocedure Evaluation (Signed)
 Anesthesia Post Note  Patient: Stacey Fitzgerald  Procedure(s) Performed: COLECTOMY, RIGHT, ROBOT-ASSISTED (Right: Abdomen)  Patient location during evaluation: PACU Anesthesia Type: General Level of consciousness: awake and alert Pain management: pain level controlled Vital Signs Assessment: post-procedure vital signs reviewed and stable Respiratory status: spontaneous breathing, nonlabored ventilation, respiratory function stable and patient connected to nasal cannula oxygen Cardiovascular status: blood pressure returned to baseline and stable Postop Assessment: no apparent nausea or vomiting Anesthetic complications: no   There were no known notable events for this encounter.   Last Vitals:  Vitals:   10/11/23 1115 10/11/23 1130  BP: (!) 139/58   Pulse: 75 78  Resp: 14 16  Temp:  36.8 C  SpO2: 98% 97%    Last Pain:  Vitals:   10/11/23 1115  TempSrc:   PainSc: Asleep                 Thurza Kwiecinski L Micha Erck

## 2023-10-11 NOTE — OR Nursing (Signed)
 Called son to update him and let him know everything was going well and that we are proceeding with the robot portion of the case.

## 2023-10-11 NOTE — Progress Notes (Signed)
 [facility]  Updated team and son. Robotic assisted right hemicolectomy. Patient did well.  PRN narcotics For pain Tylenol scheduled Binder ordered Incentive spirometry Clear diet Cardiac monitor overnight Labs in AM Will do one additional dose Invanz tomorrow AM SCDs  Algis Greenhouse, MD [facility] [address] [place], [place] [address] [phone] (office)

## 2023-10-11 NOTE — Interval H&P Note (Signed)
 History and Physical Interval Note:  10/11/2023 7:19 AM  Stacey Fitzgerald  has presented today for surgery, with the diagnosis of colon mass.  The various methods of treatment have been discussed with the patient and family. After consideration of risks, benefits and other options for treatment, the patient has consented to  Procedure(s): COLECTOMY, RIGHT, ROBOT-ASSISTED (N/A) as a surgical intervention.  The patient's history has been reviewed, patient examined, no change in status, stable for surgery.  I have reviewed the patient's chart and labs.  Questions were answered to the patient's satisfaction.    Fever yesterday, UA was ordered but not done. Will make sure to get this when foley placed pending her having a UTI. Antibiotic prep completed.  Lucretia Roers

## 2023-10-11 NOTE — Plan of Care (Signed)
  Problem: Education: Goal: Knowledge of General Education information will improve Description: Including pain rating scale, medication(s)/side effects and non-pharmacologic comfort measures Outcome: Progressing   Problem: Nutrition: Goal: Adequate nutrition will be maintained Outcome: Not Progressing   Problem: Coping: Goal: Level of anxiety will decrease Outcome: Not Progressing

## 2023-10-11 NOTE — Progress Notes (Signed)
  Progress Note   Patient: Stacey Fitzgerald ZOX:[id number] DOB: [birth year] DOA: 10/08/2023     1 DOS: the patient was seen and examined on 10/11/2023   Brief hospital course: 83 yo with H/o; hypertension, hyperlipidemia, memory deficit,?  Hypothyroidism... presenting for anemia.  Hgb 6.4 on presentation.  Assessment and Plan:  Acute on chronic iron deficiency anemia - Secondary to chronic blood loss.  Status post 2 units packed RBCs.  Iron studies showing iron 9, sat 2, ferritin 4, normal TIBC suggestive severe iron deficiency.  S/P IV iron.  Will recheck CBC in AM.  Acute on chronic lower GI bleed - GI following closely.  Status post EGD and colonoscopy 3/19.  EGD showing no acute bleed.  Colonoscopy noting newly diagnosed malignancy.  New diagnosis of ascending colon mass - GI performed colonoscopy with biopsies 3/19.  Pathology pending.  General surgery consulted and following closely.  S/P robotic assisted laparoscopic right hemicolectomy today 3/21.  Mild dementia - Monitoring closely.  Patient very pleasant with some possible memory issues.  Patient's husband at bedside to help with decision communication issues.  Hypertension - Resuming home medication regimen.  Goals of care - Now status post surgical resection, awaiting staging.        Subjective: Patient just returning from surgery earlier this morning.  Patient resting comfortably, pain well-controlled at this time.  Denies any shortness of breath, chest pain, nausea, vomiting.    Physical Exam: Vitals:   10/11/23 1100 10/11/23 1115 10/11/23 1130 10/11/23 1148  BP: (!) 139/52 (!) 139/58 (!) 127/52 (!) 132/57  Pulse: 79 75 78 75  Resp: 18 14 16 17   Temp:   98.2 F (36.8 C) 98.3 F (36.8 C)  TempSrc:    Oral  SpO2: 98% 98% 97% 95%  Weight:      Height:       GENERAL:  Alert, pleasant HEENT:  EOMI CARDIOVASCULAR:  RRR, no murmurs appreciated RESPIRATORY:  Clear to auscultation, no wheezing, rales, or  rhonchi GASTROINTESTINAL:  Soft, diffusely tender, nondistended EXTREMITIES:  No LE edema bilaterally NEURO:  No new focal deficits appreciated SKIN:  No rashes noted PSYCH:  Appropriate mood and affect   Data Reviewed:  Discussed the findings of the patient's colonoscopy  Family Communication: None at bedside  Disposition: Status is: Inpatient The patient will require care spanning > 2 midnights and should be moved to inpatient because: Ongoing blood loss anemia, surgical colectomy pending  Planned Discharge Destination: Home with Home Health    Time spent: 33 minutes  Author: Deanna Artis, DO 10/11/2023 12:51 PM  For on call review [email or web address].

## 2023-10-11 NOTE — Transfer of Care (Signed)
 Immediate Anesthesia Transfer of Care Note  Patient: Stacey Fitzgerald  Procedure(s) Performed: COLECTOMY, RIGHT, ROBOT-ASSISTED (Right: Abdomen)  Patient Location: PACU  Anesthesia Type:General  Level of Consciousness: drowsy  Airway & Oxygen Therapy: Patient Spontanous Breathing and Patient connected to nasal cannula oxygen  Post-op Assessment: Report given to RN and Post -op Vital signs reviewed and stable  Post vital signs: Reviewed and stable  Last Vitals:  Vitals Value Taken Time  BP 151/57   Temp 98   Pulse 98 10/11/23 1012  Resp 17 10/11/23 1012  SpO2 95 % 10/11/23 1012  Vitals shown include unfiled device data.  Last Pain:  Vitals:   10/11/23 0645  TempSrc:   PainSc: 0-No pain         Complications: No notable events documented.

## 2023-10-12 DIAGNOSIS — D649 Anemia, unspecified: Secondary | ICD-10-CM | POA: Diagnosis not present

## 2023-10-12 LAB — GLUCOSE, CAPILLARY: Glucose-Capillary: 100 mg/dL — ABNORMAL HIGH (ref 70–99)

## 2023-10-12 LAB — CBC WITH DIFFERENTIAL/PLATELET
Abs Immature Granulocytes: 0.13 10*3/uL — ABNORMAL HIGH (ref 0.00–0.07)
Basophils Absolute: 0 10*3/uL (ref 0.0–0.1)
Basophils Relative: 0 %
Eosinophils Absolute: 0 10*3/uL (ref 0.0–0.5)
Eosinophils Relative: 0 %
HCT: 29.3 % — ABNORMAL LOW (ref 36.0–46.0)
Hemoglobin: 8.4 g/dL — ABNORMAL LOW (ref 12.0–15.0)
Immature Granulocytes: 1 %
Lymphocytes Relative: 6 %
Lymphs Abs: 0.9 10*3/uL (ref 0.7–4.0)
MCH: 21.3 pg — ABNORMAL LOW (ref 26.0–34.0)
MCHC: 28.7 g/dL — ABNORMAL LOW (ref 30.0–36.0)
MCV: 74.4 fL — ABNORMAL LOW (ref 80.0–100.0)
Monocytes Absolute: 1.3 10*3/uL — ABNORMAL HIGH (ref 0.1–1.0)
Monocytes Relative: 9 %
Neutro Abs: 13.2 10*3/uL — ABNORMAL HIGH (ref 1.7–7.7)
Neutrophils Relative %: 84 %
Platelets: 369 10*3/uL (ref 150–400)
RBC: 3.94 MIL/uL (ref 3.87–5.11)
RDW: 20.1 % — ABNORMAL HIGH (ref 11.5–15.5)
WBC: 15.6 10*3/uL — ABNORMAL HIGH (ref 4.0–10.5)
nRBC: 0 % (ref 0.0–0.2)

## 2023-10-12 LAB — URINE CULTURE: Culture: NO GROWTH

## 2023-10-12 LAB — BASIC METABOLIC PANEL
Anion gap: 8 (ref 5–15)
BUN: 21 mg/dL (ref 8–23)
CO2: 19 mmol/L — ABNORMAL LOW (ref 22–32)
Calcium: 8.9 mg/dL (ref 8.9–10.3)
Chloride: 107 mmol/L (ref 98–111)
Creatinine, Ser: 1.16 mg/dL — ABNORMAL HIGH (ref 0.44–1.00)
GFR, Estimated: 47 mL/min — ABNORMAL LOW (ref >60)
Glucose, Bld: 104 mg/dL — ABNORMAL HIGH (ref 70–99)
Potassium: 4 mmol/L (ref 3.5–5.1)
Sodium: 134 mmol/L — ABNORMAL LOW (ref 135–145)

## 2023-10-12 LAB — MAGNESIUM: Magnesium: 1.8 mg/dL (ref 1.7–2.4)

## 2023-10-12 LAB — PHOSPHORUS: Phosphorus: 3.6 mg/dL (ref 2.5–4.6)

## 2023-10-12 NOTE — Plan of Care (Signed)
 Rested well during the night. Has not required any prn pain medication. Abdominal dressing remains clean,dry,and intact. Audible bowel sounds. No gas and no bowel movement during the shift.  Problem: Education: Goal: Knowledge of General Education information will improve Description: Including pain rating scale, medication(s)/side effects and non-pharmacologic comfort measures Outcome: Progressing   Problem: Health Behavior/Discharge Planning: Goal: Ability to manage health-related needs will improve Outcome: Progressing   Problem: Clinical Measurements: Goal: Ability to maintain clinical measurements within normal limits will improve Outcome: Progressing Goal: Will remain free from infection Outcome: Progressing Goal: Diagnostic test results will improve Outcome: Progressing Goal: Respiratory complications will improve Outcome: Progressing Goal: Cardiovascular complication will be avoided Outcome: Progressing   Problem: Activity: Goal: Risk for activity intolerance will decrease Outcome: Progressing   Problem: Nutrition: Goal: Adequate nutrition will be maintained Outcome: Progressing   Problem: Coping: Goal: Level of anxiety will decrease Outcome: Progressing   Problem: Elimination: Goal: Will not experience complications related to bowel motility Outcome: Progressing Goal: Will not experience complications related to urinary retention Outcome: Progressing   Problem: Pain Managment: Goal: General experience of comfort will improve and/or be controlled Outcome: Progressing   Problem: Safety: Goal: Ability to remain free from injury will improve Outcome: Progressing   Problem: Skin Integrity: Goal: Risk for impaired skin integrity will decrease Outcome: Progressing   Problem: Education: Goal: Understanding of discharge needs will improve Outcome: Progressing Goal: Verbalization of understanding of the causes of altered bowel function will improve Outcome:  Progressing   Problem: Activity: Goal: Ability to tolerate increased activity will improve Outcome: Progressing   Problem: Bowel/Gastric: Goal: Gastrointestinal status for postoperative course will improve Outcome: Progressing   Problem: Health Behavior/Discharge Planning: Goal: Identification of community resources to assist with postoperative recovery needs will improve Outcome: Progressing   Problem: Nutritional: Goal: Will attain and maintain optimal nutritional status will improve Outcome: Progressing   Problem: Clinical Measurements: Goal: Postoperative complications will be avoided or minimized Outcome: Progressing   Problem: Respiratory: Goal: Respiratory status will improve Outcome: Progressing   Problem: Skin Integrity: Goal: Will show signs of wound healing Outcome: Progressing

## 2023-10-12 NOTE — Plan of Care (Signed)

## 2023-10-12 NOTE — Progress Notes (Signed)
 [facility]  No Bm reported. Did have some nausea and vomiting yesterday per report. A little confused this Am.  BP (!) 145/70   Pulse 71   Temp 97.9 F (36.6 C) (Oral)   Resp (!) 21   Ht 5\' 8"  (1.727 m)   Wt 72.7 kg   SpO2 99%   BMI 24.37 kg/m  Soft, nondistended ,appropriately tender Honeycomb c/d/I binder in place  H&H stable WBC reactive   Patient s/p robotic assisted laparoscopic right hemicolectomy. Doing well overall.  Scheduled tylenol PRN narcotics IS, OOB Ambulate HD ok can likely come off monitor  Labs in AM Invanz additional dose today post op Awaiting bowel function, entereg  Clear  diet Scds   Algis Greenhouse, MD [facility] [address] [place], [place] [address] [phone] (office)

## 2023-10-12 NOTE — Progress Notes (Signed)
 Pt assisted up OOB by staff x2. Pt able to stand and use FWW to ambulate into hallway and back to recliner in room. Pt tolerated well, gait steady but some issue with maintaining balance when standing still. Abd binder remains on, DDI to abd incisions. Pt's SaO2 100% on room air after ambulation, O2 discontinued. Pt taking clear liquids without difficulty or c/o n/v. Pt states she feels much better being up OOB and sitting in chair. Chair alarm on for safety, call bell within reach, bedside table within reach. Pt advised to call for needs, especially if she needs to get OOB to use bathroom. Pt states understanding.

## 2023-10-12 NOTE — Progress Notes (Signed)
  Progress Note   Patient: Stacey Fitzgerald VWU:[id number] DOB: 31-Dec-1940 DOA: 10/08/2023     2 DOS: the patient was seen and examined on 10/12/2023   Brief hospital course: 83 yo with H/o; hypertension, hyperlipidemia, memory deficit,?  Hypothyroidism... presenting for anemia.  Hgb 6.4 on presentation.  Assessment and Plan:  New diagnosis of ascending colon mass - GI performed colonoscopy with biopsies 3/19.  Pathology pending.  General surgery consulted and following closely.  S/P robotic assisted laparoscopic right hemicolectomy 3/21.  No bowel movement or gas as of yet.  Had some nausea and vomiting last night.  Continues on clear liquid diet.  Opioids as needed.  Acute on chronic iron deficiency anemia - Secondary to chronic blood loss.  Status post 2 units packed RBCs.  Initial iron studies showing iron 9, sat 2, ferritin 4, normal TIBC suggestive severe iron deficiency.  S/P IV iron.  Will recheck CBC in AM.  Acute on chronic lower GI bleed - GI following closely.  Status post EGD and colonoscopy 3/19.  EGD showing no acute bleed.  Colonoscopy noting newly diagnosed malignancy.  Mild dementia - Monitoring closely.  Patient very pleasant with some possible memory issues.   Hypertension - Resuming home medication regimen.  Goals of care - Now status post surgical resection, awaiting staging.        Subjective: Patient resting comfortably this morning, states she feels improved from last night.  Pain present but well-controlled at this time.  Denies any fever, shortness of breath, chest pain, nausea, vomiting.  Denies having a bowel movement or passing gas.  Admits to having some emesis of her broth last night.  Physical Exam: Vitals:   10/11/23 2111 10/12/23 0647 10/12/23 0650 10/12/23 1301  BP: (!) 152/68 (!) 145/70  (!) 142/67  Pulse:  71  80  Resp:  (!) 21  19  Temp:  97.9 F (36.6 C)  98.4 F (36.9 C)  TempSrc:  Oral    SpO2:  99%  100%  Weight:   72.7 kg    Height:       GENERAL:  Alert, pleasant HEENT:  EOMI CARDIOVASCULAR:  RRR, no murmurs appreciated RESPIRATORY:  Clear to auscultation, no wheezing, rales, or rhonchi GASTROINTESTINAL: Surgical bandages, soft, diffusely tender, nondistended EXTREMITIES:  No LE edema bilaterally NEURO:  No new focal deficits appreciated SKIN:  No rashes noted PSYCH:  Appropriate mood and affect   Data Reviewed:   Family Communication: None at bedside  Disposition: Status is: Inpatient The patient will require care spanning > 2 midnights and should be moved to inpatient because: Ongoing blood loss anemia, surgical colectomy pending  Planned Discharge Destination: Home with Home Health    Time spent: 33 minutes  Author: Deanna Artis, DO 10/12/2023 1:09 PM  For on call review [email or web address].

## 2023-10-12 NOTE — Progress Notes (Signed)
 Pt has been up in recliner for several hours. Asked to go back to bed, pt agreeable to ambulate first. Pt able to ambulate >11ft using FWW, tolerated well. Requires frequent reminders to stand up tall and hold head up while walking. Pt back to bed, positioned for comfort. Pt encouraged to use IS, pt very reluctant, states, "It just hurts me too much!". Pt was agreeable to cough and deep breathing exercises, tolerated fair. Congested cough at times with thick, clear phlegm expectorated. C/o abd pain after movement and C&DB exercises, pain medication admin'd per order. Purewick not replaced at this time. Pt encouraged to use BSC for increased mobility and complete bladder emptying. Pt agreeable. Bed alarm on for safety, call bell within reach. Advised to call for needs and to not get up without calling for assistance. Pt states understanding.

## 2023-10-13 DIAGNOSIS — D649 Anemia, unspecified: Secondary | ICD-10-CM | POA: Diagnosis not present

## 2023-10-13 LAB — CBC WITH DIFFERENTIAL/PLATELET
Abs Immature Granulocytes: 0.05 10*3/uL (ref 0.00–0.07)
Basophils Absolute: 0.1 10*3/uL (ref 0.0–0.1)
Basophils Relative: 0 %
Eosinophils Absolute: 0.1 10*3/uL (ref 0.0–0.5)
Eosinophils Relative: 1 %
HCT: 29.6 % — ABNORMAL LOW (ref 36.0–46.0)
Hemoglobin: 8.4 g/dL — ABNORMAL LOW (ref 12.0–15.0)
Immature Granulocytes: 0 %
Lymphocytes Relative: 12 %
Lymphs Abs: 1.3 10*3/uL (ref 0.7–4.0)
MCH: 21.5 pg — ABNORMAL LOW (ref 26.0–34.0)
MCHC: 28.4 g/dL — ABNORMAL LOW (ref 30.0–36.0)
MCV: 75.9 fL — ABNORMAL LOW (ref 80.0–100.0)
Monocytes Absolute: 0.9 10*3/uL (ref 0.1–1.0)
Monocytes Relative: 8 %
Neutro Abs: 8.9 10*3/uL — ABNORMAL HIGH (ref 1.7–7.7)
Neutrophils Relative %: 79 %
Platelets: 365 10*3/uL (ref 150–400)
RBC: 3.9 MIL/uL (ref 3.87–5.11)
RDW: 21 % — ABNORMAL HIGH (ref 11.5–15.5)
WBC: 11.3 10*3/uL — ABNORMAL HIGH (ref 4.0–10.5)
nRBC: 0 % (ref 0.0–0.2)

## 2023-10-13 LAB — BASIC METABOLIC PANEL
Anion gap: 7 (ref 5–15)
BUN: 19 mg/dL (ref 8–23)
CO2: 22 mmol/L (ref 22–32)
Calcium: 8.6 mg/dL — ABNORMAL LOW (ref 8.9–10.3)
Chloride: 106 mmol/L (ref 98–111)
Creatinine, Ser: 0.94 mg/dL (ref 0.44–1.00)
GFR, Estimated: >60 mL/min (ref >60)
Glucose, Bld: 90 mg/dL (ref 70–99)
Potassium: 3.6 mmol/L (ref 3.5–5.1)
Sodium: 135 mmol/L (ref 135–145)

## 2023-10-13 LAB — GLUCOSE, CAPILLARY: Glucose-Capillary: 84 mg/dL (ref 70–99)

## 2023-10-13 NOTE — Progress Notes (Signed)
  Progress Note   Patient: Stacey Fitzgerald [id number] DOB: 06/06/1941 DOA: 10/08/2023     3 DOS: the patient was seen and examined on 10/13/2023   Brief hospital course: 83 yo with H/o; hypertension, hyperlipidemia, memory deficit,?  Hypothyroidism... presenting for anemia.  Hgb 6.4 on presentation.  Assessment and Plan:  New diagnosis of ascending colon mass - GI performed colonoscopy with biopsies 3/19.  Pathology pending.  General surgery consulted and following closely.  S/P robotic assisted laparoscopic right hemicolectomy 3/21.  No bowel movement or gas as of yet.  Ambulating well.  Pain appears somewhat controlled.  Advanced to full liquid diet.  Opioids as needed.  Leukocytosis improving.  Acute on chronic iron deficiency anemia - Secondary to chronic blood loss.  Status post 2 units packed RBCs.  Initial iron studies showing iron 9, sat 2, ferritin 4, normal TIBC suggestive severe iron deficiency.  S/P IV iron.  Will recheck CBC in AM.  Acute on chronic lower GI bleed - GI following closely.  Status post EGD and colonoscopy 3/19.  EGD showing no acute bleed.  Colonoscopy noting newly diagnosed malignancy.  Mild dementia - Monitoring closely.  Patient very pleasant with some possible memory issues.   Hypertension - Resuming home medication regimen.  Goals of care - Now status post surgical resection, awaiting staging.        Subjective: Patient resting comfortably this morning, states she feels improved from yesterday.  Pain present but well-controlled at this time.  Denies any fever, shortness of breath, chest pain, nausea, vomiting.  Denies having a bowel movement or passing gas.  Ambulating well.   Physical Exam: Vitals:   10/12/23 1301 10/12/23 2055 10/13/23 0523 10/13/23 0805  BP: (!) 142/67 (!) 144/67 136/70 (!) 150/61  Pulse: 80 72 73   Resp: 19 18 20    Temp: 98.4 F (36.9 C) 97.7 F (36.5 C) 97.6 F (36.4 C)   TempSrc:  Oral Oral   SpO2: 100% 95% 99%    Weight:   71.4 kg   Height:       GENERAL:  Alert, pleasant HEENT:  EOMI CARDIOVASCULAR:  RRR, no murmurs appreciated RESPIRATORY:  Clear to auscultation, no wheezing, rales, or rhonchi GASTROINTESTINAL: Surgical bandages, soft, diffusely tender, nondistended EXTREMITIES:  No LE edema bilaterally NEURO:  No new focal deficits appreciated SKIN:  No rashes noted PSYCH:  Appropriate mood and affect   Data Reviewed:   Family Communication: None at bedside  Disposition: Status is: Inpatient The patient will require care spanning > 2 midnights and should be moved to inpatient because: Ongoing blood loss anemia, surgical colectomy pending  Planned Discharge Destination: Home with Home Health    Time spent: 34 minutes  Author: Deanna Artis, DO 10/13/2023 10:59 AM  For on call review [email or web address].

## 2023-10-13 NOTE — Plan of Care (Signed)

## 2023-10-13 NOTE — Progress Notes (Signed)
 [facility]  Doing fair but still confused. Does not recall I am her surgeon. Having some pain, did have some flatus and Bm.  BP (!) 150/61   Pulse 73   Temp 97.6 F (36.4 C) (Oral)   Resp 20   Ht 5\' 8"  (1.727 m)   Wt 71.4 kg   SpO2 99%   BMI 23.93 kg/m  Soft, nondistended, appropriately tender, opened up binder, honeycomb c/d/I with no erythema or drainage, gauze on port site  Patient s/p robotic assisted laparoscopic right hemicolectomy. Doing well overall.  Scheduled tylenol PRN narcotics IS, OOB Ambulate Full liquid diet added, she can have a few crackers too Leukocytosis improved, H&H down Entereg dc  Scds   Algis Greenhouse, MD [facility] [address] [place], [place] [address] [phone] (office)

## 2023-10-14 DIAGNOSIS — D649 Anemia, unspecified: Secondary | ICD-10-CM | POA: Diagnosis not present

## 2023-10-14 LAB — BASIC METABOLIC PANEL
Anion gap: 8 (ref 5–15)
BUN: 15 mg/dL (ref 8–23)
CO2: 23 mmol/L (ref 22–32)
Calcium: 8.7 mg/dL — ABNORMAL LOW (ref 8.9–10.3)
Chloride: 104 mmol/L (ref 98–111)
Creatinine, Ser: 0.86 mg/dL (ref 0.44–1.00)
GFR, Estimated: >60 mL/min (ref >60)
Glucose, Bld: 99 mg/dL (ref 70–99)
Potassium: 3.6 mmol/L (ref 3.5–5.1)
Sodium: 135 mmol/L (ref 135–145)

## 2023-10-14 LAB — CBC
HCT: 30 % — ABNORMAL LOW (ref 36.0–46.0)
Hemoglobin: 8.9 g/dL — ABNORMAL LOW (ref 12.0–15.0)
MCH: 22.1 pg — ABNORMAL LOW (ref 26.0–34.0)
MCHC: 29.7 g/dL — ABNORMAL LOW (ref 30.0–36.0)
MCV: 74.4 fL — ABNORMAL LOW (ref 80.0–100.0)
Platelets: 390 10*3/uL (ref 150–400)
RBC: 4.03 MIL/uL (ref 3.87–5.11)
RDW: 21.6 % — ABNORMAL HIGH (ref 11.5–15.5)
WBC: 11.9 10*3/uL — ABNORMAL HIGH (ref 4.0–10.5)
nRBC: 0 % (ref 0.0–0.2)

## 2023-10-14 LAB — GLUCOSE, CAPILLARY: Glucose-Capillary: 109 mg/dL — ABNORMAL HIGH (ref 70–99)

## 2023-10-14 MED ORDER — DOCUSATE SODIUM 100 MG PO CAPS
100.0000 mg | ORAL_CAPSULE | Freq: Two times a day (BID) | ORAL | Status: DC
  Administered 2023-10-14 – 2023-10-17 (×7): 100 mg via ORAL
  Filled 2023-10-14 (×7): qty 1

## 2023-10-14 NOTE — Progress Notes (Signed)
  Progress Note   Patient: Stacey Fitzgerald WUJ:[id number] DOB: Jul 17, 1941 DOA: 10/08/2023     4 DOS: the patient was seen and examined on 10/14/2023   Brief hospital course: 83 yo with H/o; hypertension, hyperlipidemia, memory deficit,?  Hypothyroidism... presenting for anemia.  Hgb 6.4 on presentation.  Assessment and Plan:  New diagnosis of ascending colon mass - GI performed colonoscopy with biopsies 3/19.  Pathology pending.  General surgery consulted and following closely.  S/P robotic assisted laparoscopic right hemicolectomy 3/21.  No bowel movement or gas as of yet.  Ambulating well.  Pain appears somewhat controlled.  Advanced to full liquid diet.  Opioids as needed.  Leukocytosis improving.  Patient tolerating p.o. and having bowel movements.  Acute on chronic iron deficiency anemia - Secondary to chronic blood loss.  Status post 2 units packed RBCs.  Initial iron studies showing iron 9, sat 2, ferritin 4, normal TIBC suggestive severe iron deficiency.  S/P IV iron.  Will recheck CBC in AM.  Acute on chronic lower GI bleed - GI following closely.  Status post EGD and colonoscopy 3/19.  EGD showing no acute bleed.  Colonoscopy noting newly diagnosed malignancy.  Mild dementia - Monitoring closely.  Patient very pleasant with some possible memory issues.   Hypertension - Resuming home medication regimen.  Physical debilitation and muscle weakness - Will order PT eval.  Goals of care - Patient continues to show improvement.  Awaiting PT eval to assess whether patient needs ongoing rehab, SNF versus HH.     Subjective: Patient resting comfortably this morning, states she feels improved from yesterday.  Pain present but well-controlled at this time.  Denies any fever, shortness of breath, chest pain, nausea, vomiting.  Admits to having 2 bowel movements this morning.  Tolerating soft diet.  Physical Exam: Vitals:   10/13/23 0805 10/13/23 1331 10/13/23 2014 10/14/23 0433   BP: (!) 150/61 (!) 146/64 133/61 (!) 153/74  Pulse:  85 97 95  Resp:  18 16 18   Temp:  97.9 F (36.6 C) 98.2 F (36.8 C) 97.7 F (36.5 C)  TempSrc:   Oral Oral  SpO2:  97% 98% 97%  Weight:    71.5 kg  Height:       GENERAL:  Alert, pleasant HEENT:  EOMI CARDIOVASCULAR:  RRR, no murmurs appreciated RESPIRATORY:  Clear to auscultation, no wheezing, rales, or rhonchi GASTROINTESTINAL: Surgical bandages, soft, diffusely tender, nondistended EXTREMITIES:  No LE edema bilaterally NEURO:  No new focal deficits appreciated SKIN:  No rashes noted PSYCH:  Appropriate mood and affect   Data Reviewed:   Family Communication: None at bedside  Disposition: Status is: Inpatient The patient will require care spanning > 2 midnights and should be moved to inpatient because: Ongoing blood loss anemia, surgical colectomy pending  Planned Discharge Destination: Home with Home Health    Time spent: 33 minutes  Author: Deanna Artis, DO 10/14/2023 12:18 PM  For on call review [email or web address].

## 2023-10-14 NOTE — Progress Notes (Addendum)
 [facility]  Doing well. Ambulating. Sitting in chair now.  Eating and having Bms.  BP (!) 153/74 (BP Location: Right Arm)   Pulse 95   Temp 97.7 F (36.5 C) (Oral)   Resp 18   Ht 5\' 8"  (1.727 m)   Wt 71.5 kg   SpO2 97%   BMI 23.97 kg/m  Soft staples c/d/I with no erythema or drainage Appropriately tender  Patient s/p robotic assisted laparoscopic right hemicolectomy. Doing well overall.  Scheduled tylenol PRN narcotics IS, OOB Ambulate Soft diet H&H and WBC stable Scds  PT to see if needs rehab Discussed with Kathlene November her son. Discussed with Sharlene Dory.     Algis Greenhouse, MD [facility] [address] [place], [place] [address] [phone] (office)

## 2023-10-14 NOTE — Addendum Note (Signed)
 Addended by: Armstead Peaks on: 10/14/2023 07:43 AM   Modules accepted: Orders

## 2023-10-14 NOTE — Progress Notes (Signed)
 Mobility Specialist Progress Note:    10/14/23 1038  Mobility  Activity Transferred from chair to bed  Level of Assistance Moderate assist, patient does 50-74%  Assistive Device Front wheel walker  Distance Ambulated (ft) 3 ft  Range of Motion/Exercises Active;All extremities  Activity Response Tolerated well  Mobility Referral Yes  Mobility visit 1 Mobility  Mobility Specialist Start Time (ACUTE ONLY) 1015  Mobility Specialist Stop Time (ACUTE ONLY) 1030  Mobility Specialist Time Calculation (min) (ACUTE ONLY) 15 min   Pt received in chair, requesting assistance to bed. Required ModA to stand and transfer with RW. Tolerated well, asx throughout. Left pt supine, all needs met.   Lawerance Bach Mobility Specialist Please contact via Special educational needs teacher or  Rehab office at [phone]

## 2023-10-14 NOTE — Plan of Care (Signed)
  Problem: Acute Rehab PT Goals(only PT should resolve) Goal: Patient Will Transfer Sit To/From Stand Outcome: Progressing Flowsheets (Taken 10/14/2023 1254) Patient will transfer sit to/from stand:  with modified independence  with supervision Goal: Pt Will Transfer Bed To Chair/Chair To Bed Outcome: Progressing Flowsheets (Taken 10/14/2023 1254) Pt will Transfer Bed to Chair/Chair to Bed:  with modified independence  with supervision Goal: Pt Will Ambulate Outcome: Progressing Flowsheets (Taken 10/14/2023 1254) Pt will Ambulate:  100 feet  with modified independence  with supervision  Luz Lex, PT, DPT [facility] Office: [phone] 12:55 PM, 10/14/23

## 2023-10-14 NOTE — Evaluation (Signed)
 Physical Therapy Evaluation Patient Details Name: Stacey Fitzgerald MRN: [id number] DOB: 13-Sep-1940 Today's Date: 10/14/2023  History of Present Illness  Stacey Fitzgerald is a 66 yof with PMH/o; hypertension, hyperlipidemia, memory deficit,?  Hypothyroidism... presenting for anemia.  Denies any chest pain or shortness of breath, complaining of generalized weakness.,  "feeling drowsy, sleepy", generalized weakness.  Denied having any dark, bloody stool, denies any bloody urine or vomiting blood.,  Not chronically on blood thinners.  Sent in from PCP for low hemoglobin level. S/p abdominal surgery 3/21. Stacey Fitzgerald is a 83 y.o. female with a history of colonic polyps and microcytic anemia who was admitted on 3/18 for anemia. That day she received 2 units of blood, and the following day 3/19 was seen by GI who conducted upper endoscopy which was normal and colonoscopy which showed a mass in the ascending colon. Her son, who was present, noted she was unaware of the findings and waiting on the GI physician.  This morning she denies any abdominal pain or discomfort, dark, bloody stools or BRBPR. She denies fever, chills, lightheadedness, shortness of breath, but noted she needed help getting up to use the restroom this morning, which is not usual. She said otherwise, she was feeling good and hoping to be discharged soon. Per MD note.   Clinical Impression  Pt presents resting in bed, c/o not getting much sleep today. Pt demonstrates good independence with bed mobility with increased time allowed. Pt demonstrates need for min assist for functional transfer for first trial but is able to perform modified independently later in session. Pt demonstrates decreased LE strength and balance  during functional transfers and ambulation with need for RW. Pt would continue to benefit from skilled acute PT for addressing the deficits above for improved independence with ADLs, community ambulation and return to  higher level of function.      If plan is discharge home, recommend the following: A little help with walking and/or transfers;A little help with bathing/dressing/bathroom;Assistance with cooking/housework;Help with stairs or ramp for entrance   Can travel by private vehicle   No    Equipment Recommendations Rolling walker (2 wheels);BSC/3in1  Recommendations for Other Services       Functional Status Assessment Patient has had a recent decline in their functional status and demonstrates the ability to make significant improvements in function in a reasonable and predictable amount of time.     Precautions / Restrictions Precautions Precautions: Fall Recall of Precautions/Restrictions: Intact Restrictions Weight Bearing Restrictions Per Provider Order: No      Mobility  Bed Mobility Overal bed mobility: Modified Independent             General bed mobility comments: increased time, verbal cueing    Transfers Overall transfer level: Modified independent Equipment used: Rolling walker (2 wheels)               General transfer comment: CGA, gait belt, improved independence as session continued    Ambulation/Gait Ambulation/Gait assistance: Modified independent (Device/Increase time), Contact guard assist Gait Distance (Feet): 75 Feet Assistive device: Rolling walker (2 wheels) Gait Pattern/deviations: Step-to pattern, Decreased step length - right, Decreased stride length, Antalgic Gait velocity: decreased     General Gait Details: Reported pain on RLE, evident with decreased stance time on RLE.  Stairs            Wheelchair Mobility     Tilt Bed    Modified Rankin (Stroke Patients Only)  Balance Overall balance assessment: Modified Independent                                           Pertinent Vitals/Pain Pain Assessment Pain Assessment: Faces Faces Pain Scale: Hurts little more Pain Location: surgical  area Pain Intervention(s): Limited activity within patient's tolerance, Monitored during session, Repositioned    Home Living Family/patient expects to be discharged to:: Private residence Living Arrangements: Alone Available Help at Discharge: Family Type of Home: House Home Access: Stairs to enter Entrance Stairs-Rails: Right Entrance Stairs-Number of Steps: 2 Alternate Level Stairs-Number of Steps: 10 Home Layout: Two level Home Equipment: None      Prior Function Prior Level of Function : Independent/Modified Independent;Driving             Mobility Comments: community ambulator ADLs Comments: independent     Extremity/Trunk Assessment   Upper Extremity Assessment Upper Extremity Assessment: Overall WFL for tasks assessed    Lower Extremity Assessment Lower Extremity Assessment: Generalized weakness (right weaker/more painful than left)    Cervical / Trunk Assessment Cervical / Trunk Assessment: Kyphotic  Communication   Communication Communication: No apparent difficulties    Cognition Arousal: Alert Behavior During Therapy: WFL for tasks assessed/performed   PT - Cognitive impairments: No apparent impairments                         Following commands: Intact       Cueing Cueing Techniques: Verbal cues, Visual cues     General Comments      Exercises     Assessment/Plan    PT Assessment Patient needs continued PT services  PT Problem List Decreased strength;Decreased activity tolerance;Decreased balance;Decreased mobility;Pain       PT Treatment Interventions DME instruction;Gait training;Stair training;Functional mobility training;Therapeutic activities;Therapeutic exercise;Balance training;Patient/family education    PT Goals (Current goals can be found in the Care Plan section)  Acute Rehab PT Goals Patient Stated Goal: to leave the hospital PT Goal Formulation: With patient Time For Goal Achievement: 10/28/23 Potential to  Achieve Goals: Good    Frequency Min 3X/week     Co-evaluation               AM-PAC PT "6 Clicks" Mobility  Outcome Measure Help needed turning from your back to your side while in a flat bed without using bedrails?: None Help needed moving from lying on your back to sitting on the side of a flat bed without using bedrails?: None Help needed moving to and from a bed to a chair (including a wheelchair)?: A Little Help needed standing up from a chair using your arms (e.g., wheelchair or bedside chair)?: None Help needed to walk in hospital room?: A Little Help needed climbing 3-5 steps with a railing? : A Lot 6 Click Score: 20    End of Session Equipment Utilized During Treatment: Gait belt Activity Tolerance: Patient tolerated treatment well;Patient limited by pain Patient left: in chair;with chair alarm set;with call bell/phone within reach   PT Visit Diagnosis: Difficulty in walking, not elsewhere classified (R26.2);Other abnormalities of gait and mobility (R26.89);Muscle weakness (generalized) (M62.81)    Time: 1610-9604 PT Time Calculation (min) (ACUTE ONLY): 26 min   Charges:   PT Evaluation $PT Eval Low Complexity: 1 Low PT Treatments $Therapeutic Activity: 8-22 mins PT General Charges $$ ACUTE PT VISIT: 1 Visit  Luz Lex, PT, DPT [facility] Office: [phone] 12:54 PM, 10/14/23

## 2023-10-14 NOTE — Care Management Important Message (Signed)
 Important Message  Patient Details  Name: Stacey Fitzgerald MRN: [id number] Date of Birth: May 11, 1941   Important Message Given:  N/A - LOS <3 / Initial given by admissions     Corey Harold 10/14/2023, 11:44 AM

## 2023-10-14 NOTE — Telephone Encounter (Signed)
 Referral placed.

## 2023-10-14 NOTE — TOC Initial Note (Signed)
 Transition of Care [facility]) - Initial/Assessment Note    Patient Details  Name: Stacey Fitzgerald MRN: [id number] Date of Birth: [birth year]  Transition [facility]) CM/SW Contact:    Elliot Gault, LCSW Phone Number: 10/14/2023, 1:20 PM  Clinical Narrative:                  Pt admitted from home. PT recommending SNF rehab at dc. Spoke with pt's son and with pt to review dc planning.  Pt lives alone and her one son lives in [place]. Her other son lives very close by but his wife just had knee replacement surgery so he is taking care of her. Family preference is for short term rehab. Pt agreed to [facility] making referrals. CMS provider options reviewed and will refer as requested. Will also start insurance auth.  TOC will follow.  Expected Discharge Plan: Skilled Nursing Facility Barriers to Discharge: Continued Medical Work up   Patient Goals and CMS Choice Patient states their goals for this hospitalization and ongoing recovery are:: get stronger CMS Medicare.gov Compare Post Acute Care list provided to:: Patient Choice offered to / list presented to : Patient, Adult Children [place] ownership interest in [facility].provided to:: Patient    Expected Discharge Plan and Services In-house Referral: Clinical Social Work Discharge Planning Services: CM Consult Post Acute Care Choice: Skilled Nursing Facility Living arrangements for the past 2 months: Single Family Home                                      Prior Living Arrangements/Services Living arrangements for the past 2 months: Single Family Home Lives with:: Self Patient language and need for interpreter reviewed:: Yes Do you feel safe going back to the place where you live?: Yes      Need for Family Participation in Patient Care: Yes (Comment) Care giver support system in place?: Yes (comment)   Criminal Activity/Legal Involvement Pertinent to Current Situation/Hospitalization: No - Comment as  needed  Activities of Daily Living   ADL Screening (condition at time of admission) Independently performs ADLs?: Yes (appropriate for developmental age) Is the patient deaf or have difficulty hearing?: Yes Does the patient have difficulty seeing, even when wearing glasses/contacts?: No Does the patient have difficulty concentrating, remembering, or making decisions?: No  Permission Sought/Granted                  Emotional Assessment Appearance:: Appears stated age Attitude/Demeanor/Rapport: Engaged Affect (typically observed): Accepting Orientation: : Oriented to Self, Oriented to Place, Oriented to  Time, Oriented to Situation Alcohol / Substance Use: Not Applicable Psych Involvement: No (comment)  Admission diagnosis:  Symptomatic anemia [D64.9] Patient Active Problem List   Diagnosis Date Noted   Colon cancer, ascending (HCC) 10/10/2023   Symptomatic anemia 10/08/2023   Mild dementia without behavioral disturbance, psychotic disturbance, mood disturbance, or anxiety (HCC) 11/29/2022   Memory changes 10/26/2022   Arthritis of left knee 07/07/2021   Uterine procidentia 02/04/2018   Pure hypercholesterolemia 01/28/2015   Essential hypertension 01/28/2015   Hypothyroidism 01/28/2015   Monilial intertrigo 01/28/2015   Rhus dermatitis 01/28/2015   Hernia, rectovaginal 10/20/2013   Climacteric 10/20/2013   Cystocele with prolapse 10/20/2013   PCP:  Debera Lat, PA-C Pharmacy:   [facility] [phone] [place], [place] - [address] [place] [address] Phone: [phone] Fax: [phone]  Social Drivers of Health (SDOH) Social History: SDOH Screenings   Food Insecurity: No Food Insecurity (10/08/2023)  Housing: Low Risk  (10/08/2023)  Transportation Needs: Unmet Transportation Needs (10/08/2023)  Utilities: Not At Risk (10/08/2023)  Alcohol Screen: Low Risk  (11/20/2022)  Depression (PHQ2-9): Low Risk  (10/03/2023)  Financial Resource  Strain: Low Risk  (10/02/2023)  Physical Activity: Inactive (10/02/2023)  Social Connections: Moderately Integrated (10/08/2023)  Stress: No Stress Concern Present (10/02/2023)  Tobacco Use: Low Risk  (10/11/2023)   SDOH Interventions: Transportation Interventions: Inpatient TOC   Readmission Risk Interventions     No data to display

## 2023-10-14 NOTE — NC FL2 (Signed)
 [place] MEDICAID FL2 LEVEL OF CARE FORM     IDENTIFICATION  Patient Name: Stacey Fitzgerald Birthdate: 1940/08/28 Sex: female Admission Date (Current Location): 10/08/2023  [facility] and [place] Number:  [employer] and Address:  [facility],  [address], [place] [address]      Provider Number: [id number]  Attending Physician Name and Address:  Deanna Artis, DO  Relative Name and Phone Number:       Current Level of Care: Hospital Recommended Level of Care: Skilled Nursing Facility Prior Approval Number:    Date Approved/Denied:   PASRR Number: [id number]  Discharge Plan: SNF    Current Diagnoses: Patient Active Problem List   Diagnosis Date Noted   Colon cancer, ascending (HCC) 10/10/2023   Symptomatic anemia 10/08/2023   Mild dementia without behavioral disturbance, psychotic disturbance, mood disturbance, or anxiety (HCC) 11/29/2022   Memory changes 10/26/2022   Arthritis of left knee 07/07/2021   Uterine procidentia 02/04/2018   Pure hypercholesterolemia 01/28/2015   Essential hypertension 01/28/2015   Hypothyroidism 01/28/2015   Monilial intertrigo 01/28/2015   Rhus dermatitis 01/28/2015   Hernia, rectovaginal 10/20/2013   Climacteric 10/20/2013   Cystocele with prolapse 10/20/2013    Orientation RESPIRATION BLADDER Height & Weight     Self, Time, Situation, Place  Normal Incontinent Weight: 157 lb 10.1 oz (71.5 kg) Height:  5\' 8"  (172.7 cm)  BEHAVIORAL SYMPTOMS/MOOD NEUROLOGICAL BOWEL NUTRITION STATUS      Continent Diet (See D/C summary)  AMBULATORY STATUS COMMUNICATION OF NEEDS Skin   Extensive Assist Verbally Normal                       Personal Care Assistance Level of Assistance  Bathing, Feeding, Dressing Bathing Assistance: Limited assistance Feeding assistance: Independent Dressing Assistance: Limited assistance     Functional Limitations Info  Sight, Hearing, Speech Sight Info: Impaired Hearing  Info: Impaired Speech Info: Adequate    SPECIAL CARE FACTORS FREQUENCY  PT (By licensed PT), OT (By licensed OT)     PT Frequency: 5 times weekly OT Frequency: 5 times weekly            Contractures Contractures Info: Not present    Additional Factors Info  Code Status, Allergies Code Status Info: FULL Allergies Info: Cephalexin           Current Medications (10/14/2023):  This is the current hospital active medication list Current Facility-Administered Medications  Medication Dose Route Frequency Provider Last Rate Last Admin   acetaminophen (TYLENOL) tablet 1,000 mg  1,000 mg Oral Q6H Lucretia Roers, MD   1,000 mg at 10/13/23 1703   docusate sodium (COLACE) capsule 100 mg  100 mg Oral BID Lucretia Roers, MD       ferric gluconate (FERRLECIT) 250 mg in sodium chloride 0.9 % 250 mL IVPB  250 mg Intravenous Once Lucretia Roers, MD       ferrous sulfate tablet 325 mg  325 mg Oral BID WC Lucretia Roers, MD   325 mg at 10/14/23 5621   hydrALAZINE (APRESOLINE) injection 10 mg  10 mg Intravenous Q4H PRN Lucretia Roers, MD       HYDROmorphone (DILAUDID) injection 0.5-1 mg  0.5-1 mg Intravenous Q2H PRN Lucretia Roers, MD       ipratropium (ATROVENT) nebulizer solution 0.5 mg  0.5 mg Nebulization Q6H PRN Lucretia Roers, MD       levalbuterol Pauline Aus) nebulizer solution 0.63  mg  0.63 mg Nebulization Q6H PRN Lucretia Roers, MD       lisinopril (ZESTRIL) tablet 20 mg  20 mg Oral BID Lucretia Roers, MD   20 mg at 10/14/23 0924   ondansetron (ZOFRAN) tablet 4 mg  4 mg Oral Q6H PRN Lucretia Roers, MD       Or   ondansetron [facility]) injection 4 mg  4 mg Intravenous Q6H PRN Lucretia Roers, MD   4 mg at 10/10/23 1710   oxyCODONE (Oxy IR/ROXICODONE) immediate release tablet 5 mg  5 mg Oral Q4H PRN Lucretia Roers, MD   5 mg at 10/12/23 1821   pantoprazole (PROTONIX) injection 40 mg  40 mg Intravenous Q12H Lucretia Roers, MD   40 mg at 10/14/23  4401   simvastatin (ZOCOR) tablet 20 mg  20 mg Oral QHS Lucretia Roers, MD   20 mg at 10/13/23 2014   sodium chloride flush (NS) 0.9 % injection 3 mL  3 mL Intravenous Q12H Lucretia Roers, MD   3 mL at 10/14/23 0272   sodium chloride flush (NS) 0.9 % injection 3-10 mL  3-10 mL Intravenous PRN Lucretia Roers, MD       sodium phosphate (FLEET) enema 1 enema  1 enema Rectal Once PRN Lucretia Roers, MD       traZODone (DESYREL) tablet 25 mg  25 mg Oral QHS PRN Lucretia Roers, MD   25 mg at 10/13/23 2014     Discharge Medications: Please see discharge summary for a list of discharge medications.  Relevant Imaging Results:  Relevant Lab Results:   Additional Information SSN: [id number] [place]

## 2023-10-15 DIAGNOSIS — D649 Anemia, unspecified: Secondary | ICD-10-CM | POA: Diagnosis not present

## 2023-10-15 LAB — CULTURE, BLOOD (ROUTINE X 2)
Culture: NO GROWTH
Culture: NO GROWTH
Special Requests: ADEQUATE
Special Requests: ADEQUATE

## 2023-10-15 LAB — BASIC METABOLIC PANEL
Anion gap: 9 (ref 5–15)
BUN: 16 mg/dL (ref 8–23)
CO2: 23 mmol/L (ref 22–32)
Calcium: 8.9 mg/dL (ref 8.9–10.3)
Chloride: 106 mmol/L (ref 98–111)
Creatinine, Ser: 0.83 mg/dL (ref 0.44–1.00)
GFR, Estimated: >60 mL/min (ref >60)
Glucose, Bld: 99 mg/dL (ref 70–99)
Potassium: 3.7 mmol/L (ref 3.5–5.1)
Sodium: 138 mmol/L (ref 135–145)

## 2023-10-15 LAB — CBC
HCT: 30.3 % — ABNORMAL LOW (ref 36.0–46.0)
Hemoglobin: 8.9 g/dL — ABNORMAL LOW (ref 12.0–15.0)
MCH: 21.9 pg — ABNORMAL LOW (ref 26.0–34.0)
MCHC: 29.4 g/dL — ABNORMAL LOW (ref 30.0–36.0)
MCV: 74.6 fL — ABNORMAL LOW (ref 80.0–100.0)
Platelets: 354 10*3/uL (ref 150–400)
RBC: 4.06 MIL/uL (ref 3.87–5.11)
RDW: 22.2 % — ABNORMAL HIGH (ref 11.5–15.5)
WBC: 11.7 10*3/uL — ABNORMAL HIGH (ref 4.0–10.5)
nRBC: 0 % (ref 0.0–0.2)

## 2023-10-15 LAB — SURGICAL PATHOLOGY

## 2023-10-15 LAB — GLUCOSE, CAPILLARY: Glucose-Capillary: 97 mg/dL (ref 70–99)

## 2023-10-15 NOTE — Progress Notes (Addendum)
  Progress Note   Patient: Stacey Fitzgerald:[id number] DOB: 18-Mar-1941 DOA: 10/08/2023     5 DOS: the patient was seen and examined on 10/15/2023   Brief hospital course: 83 yo with H/o; hypertension, hyperlipidemia, memory deficit,?  Hypothyroidism... presenting for anemia.  Hgb 6.4 on presentation.  Assessment and Plan:  New diagnosis of ascending colon mass - GI performed colonoscopy with biopsies 3/19.  Pathology pending.  General surgery consulted and following closely.  S/P robotic assisted laparoscopic right hemicolectomy 3/21.  No bowel movement or gas as of yet.  Ambulating well.  Pain appears somewhat controlled.  Advanced to full liquid diet.  Opioids as needed.  Leukocytosis improving.  Patient tolerating p.o. and having bowel movements.  Acute on chronic iron deficiency anemia - Secondary to chronic blood loss.  Status post 2 units packed RBCs.  Initial iron studies showing iron 9, sat 2, ferritin 4, normal TIBC suggestive severe iron deficiency.  S/P IV iron.  Will recheck CBC in AM.  Acute on chronic lower GI bleed - GI following closely.  Status post EGD and colonoscopy 3/19.  EGD showing no acute bleed.  Colonoscopy noting newly diagnosed malignancy.  Mild dementia - Monitoring closely.  Patient very pleasant with some possible memory issues.   Hypertension - Resuming home medication regimen.  Physical debilitation and muscle weakness - PT eval recommending STR.  Goals of care - Working closely with case management on disposition planning to STR.  Anticipate discharge later today or tomorrow.     Subjective: Patient resting comfortably this morning.  Pain present but well-controlled at this time.  Denies any fever, shortness of breath, chest pain, nausea, vomiting.  Working well with PT.  Tolerating soft diet.  Was able to update patient's son yesterday on goals and disposition planning.  Physical Exam: Vitals:   10/14/23 2118 10/15/23 0458 10/15/23 0500  10/15/23 0800  BP: (!) 146/88 (!) 159/69  (!) 154/67  Pulse: 89 82    Resp: 12 18    Temp: (!) 97.4 F (36.3 C) 97.8 F (36.6 C)    TempSrc: Oral Oral    SpO2: 97% 98%    Weight:   65.5 kg   Height:       GENERAL:  Alert, pleasant HEENT:  EOMI CARDIOVASCULAR:  RRR, no murmurs appreciated RESPIRATORY:  Clear to auscultation, no wheezing, rales, or rhonchi GASTROINTESTINAL: Surgical bandages, soft, diffusely tender, nondistended EXTREMITIES:  No LE edema bilaterally NEURO:  No new focal deficits appreciated SKIN:  No rashes noted PSYCH:  Appropriate mood and affect   Data Reviewed:   Family Communication: None at bedside  Disposition: Status is: Inpatient The patient will require care spanning > 2 midnights and should be moved to inpatient because: Ongoing blood loss anemia, surgical colectomy pending  Planned Discharge Destination: Home with Home Health    Time spent: 34 minutes  Author: Deanna Artis, DO 10/15/2023 12:24 PM  For on call review [email or web address].

## 2023-10-15 NOTE — Progress Notes (Signed)
 Mobility Specialist Progress Note:    10/15/23 1000  Mobility  Activity Ambulated with assistance in room;Ambulated with assistance to bathroom  Level of Assistance Minimal assist, patient does 75% or more  Assistive Device Front wheel walker  Distance Ambulated (ft) 40 ft  Range of Motion/Exercises Active;All extremities  Activity Response Tolerated well  Mobility Referral Yes  Mobility visit 1 Mobility  Mobility Specialist Start Time (ACUTE ONLY) 0940  Mobility Specialist Stop Time (ACUTE ONLY) 1000  Mobility Specialist Time Calculation (min) (ACUTE ONLY) 20 min   Pt received in chair, agreeable to mobility. Required MinA to stand and SBA to ambulate with RW. Tolerated well, asx throughout. Returned to chair, alarm on. Call bell in reach, all needs met.  Lawerance Bach Mobility Specialist Please contact via Special educational needs teacher or  Rehab office at [phone]

## 2023-10-15 NOTE — Plan of Care (Signed)

## 2023-10-15 NOTE — Progress Notes (Signed)
 [facility]  Doing fair. Eating. Sitting in chair. Having bms. To SNF later for rehab.  BP (!) 154/67   Pulse 82   Temp 97.8 F (36.6 C) (Oral)   Resp 18   Ht 5\' 8"  (1.727 m)   Wt 65.5 kg   SpO2 98%   BMI 21.96 kg/m  Soft, appropriately tender, staples c/d/I with no erythema or drainage   Patient s/p robotic assisted laparoscopic right hemicolectomy.   PRN narcotics IS, OOB Ambulate Soft diet Likely to rehab today  Future Appointments  Date Time Provider Department Center  10/21/2023  8:15 AM Doreatha Massed, MD [facility] None  10/21/2023  9:00 AM [facility] LAB [facility] None  10/24/2023  2:45 PM Lucretia Roers, MD [facility] None  11/25/2023  3:10 PM [facility]-ANNUAL WELLNESS VISIT [facility] PEC   Algis Greenhouse, MD [facility] [address] [place], [place] [address] [phone] (office)

## 2023-10-15 NOTE — TOC Progression Note (Signed)
 Transition of Care [facility]) - Progression Note    Patient Details  Name: Stacey Fitzgerald MRN: [id number] Date of Birth: 1941/03/15  Transition of Care [facility]) CM/SW Contact  Elliot Gault, LCSW Phone Number: 10/15/2023, 4:15 PM  Clinical Narrative:     TOC following. SNF [name] is still pending. Updated pt's son. TOC will follow up tomorrow to assist with pt's dc needs.  Expected Discharge Plan: Skilled Nursing Facility Barriers to Discharge: Continued Medical Work up  Expected Discharge Plan and Services In-house Referral: Clinical Social Work Discharge Planning Services: CM Consult Post Acute Care Choice: Skilled Nursing Facility Living arrangements for the past 2 months: Single Family Home                                       Social Determinants of Health (SDOH) Interventions SDOH Screenings   Food Insecurity: No Food Insecurity (10/08/2023)  Housing: Low Risk  (10/08/2023)  Transportation Needs: Unmet Transportation Needs (10/08/2023)  Utilities: Not At Risk (10/08/2023)  Alcohol Screen: Low Risk  (11/20/2022)  Depression (PHQ2-9): Low Risk  (10/03/2023)  Financial Resource Strain: Low Risk  (10/02/2023)  Physical Activity: Inactive (10/02/2023)  Social Connections: Moderately Integrated (10/08/2023)  Stress: No Stress Concern Present (10/02/2023)  Tobacco Use: Low Risk  (10/11/2023)    Readmission Risk Interventions     No data to display

## 2023-10-16 DIAGNOSIS — D649 Anemia, unspecified: Secondary | ICD-10-CM | POA: Diagnosis not present

## 2023-10-16 LAB — GLUCOSE, CAPILLARY: Glucose-Capillary: 107 mg/dL — ABNORMAL HIGH (ref 70–99)

## 2023-10-16 MED ORDER — LACTINEX PO CHEW: 1.0000 | CHEWABLE_TABLET | Freq: Three times a day (TID) | ORAL | 0 refills | Status: AC

## 2023-10-16 MED ORDER — TRAZODONE HCL 50 MG PO TABS: 25.0000 mg | ORAL_TABLET | Freq: Every evening | ORAL | 0 refills | Status: DC | PRN

## 2023-10-16 MED ORDER — DOCUSATE SODIUM 100 MG PO CAPS: 100.0000 mg | ORAL_CAPSULE | Freq: Two times a day (BID) | ORAL | 0 refills | Status: DC

## 2023-10-16 MED ORDER — ONDANSETRON HCL 4 MG PO TABS: 4.0000 mg | ORAL_TABLET | Freq: Four times a day (QID) | ORAL | 0 refills | Status: DC | PRN

## 2023-10-16 MED ORDER — PANTOPRAZOLE SODIUM 40 MG PO TBEC: 40.0000 mg | DELAYED_RELEASE_TABLET | Freq: Two times a day (BID) | ORAL | 0 refills | Status: DC

## 2023-10-16 MED ORDER — OXYCODONE HCL 5 MG PO TABS: 5.0000 mg | ORAL_TABLET | ORAL | 0 refills | Status: AC | PRN

## 2023-10-16 MED ORDER — FERROUS SULFATE 325 (65 FE) MG PO TABS: 325.0000 mg | ORAL_TABLET | Freq: Two times a day (BID) | ORAL | 3 refills | Status: DC

## 2023-10-16 NOTE — Progress Notes (Signed)
 Mobility Specialist Progress Note:    10/16/23 1237  Mobility  Activity Ambulated with assistance to bathroom  Level of Assistance Minimal assist, patient does 75% or more  Assistive Device Front wheel walker  Distance Ambulated (ft) 25 ft  Range of Motion/Exercises Active;All extremities  Activity Response Tolerated well  Mobility Referral Yes  Mobility visit 1 Mobility  Mobility Specialist Start Time (ACUTE ONLY) 1215  Mobility Specialist Stop Time (ACUTE ONLY) 1230  Mobility Specialist Time Calculation (min) (ACUTE ONLY) 15 min   Pt received requesting assistance to bathroom. Required MinA to stand and ambulate with RW.Tolerated well,asx throughout. Left pt with NT, all needs met.  Lawerance Bach Mobility Specialist Please contact via Special educational needs teacher or  Rehab office at [phone]

## 2023-10-16 NOTE — Consult Note (Signed)
 [facility] Liaison Note  10/16/2023  Stacey Fitzgerald November 14, 1940 [id number]  Location: RN Hospital Liaison screened the patient remotely at [facility].  Insurance: [employer]   Stacey Fitzgerald is a 83 y.o. female who is a Primary Care Patient of [facility] Moore, Edmon Crape, PA-C [facility].  The patient was screened for readmission hospitalization with noted low risk score for unplanned readmission risk with 1 IP in 6 months.  The patient was assessed for potential Care Management service needs for post hospital transition for care coordination. Review of patient's electronic medical record reveals patient was admitted for Symptomatic Anemia. P recommended for SNF level of care. Pt will discharged today to [facility] for STR. Liaison will make a referral to PAC-RN to follow up according post hospital discharge to the facility.    Plan: [facility] Liaison will continue to follow progress and disposition to asess for post hospital community care coordination/management needs.  Referral request for community care coordination: Liaison will make a referral to the PAC-RN to follow up accordingly.   [facility] Care Management/Population Health does not replace or interfere with any arrangements made by the Inpatient Transition of Care team.   For questions contact:   Elliot Cousin, RN, BSN Hospital Liaison [place]   [facility], Population Health Office Hours MTWF  8:00 am-6:00 pm Direct Dial: [phone] mobile [email or web address]

## 2023-10-16 NOTE — Discharge Summary (Signed)
 Physician Discharge Summary [facility]    Patient: Stacey Fitzgerald                   Admit date: 10/08/2023   DOB: 07-26-1940             Discharge date:10/16/2023/7:37 AM QIO:[id number]                          PCP: Debera Lat, PA-C  Disposition:    Recommendations for Outpatient Follow-up:   Follow up: With PCP in 1 week Follow-up with general surgery 1-2 weeks Follow-up with oncologist Dr. Ellin Saba in 1 week Advance diet as tolerated  Discharge Condition: Stable   Code Status:   Code Status: Full Code  Diet recommendation:  Advance as tolerated per general surgery   Discharge Diagnoses:    Principal Problem:   Symptomatic anemia Active Problems:   Pure hypercholesterolemia   Hypothyroidism   Mild dementia without behavioral disturbance, psychotic disturbance, mood disturbance, or anxiety (HCC)   Essential hypertension   Colon cancer, ascending (HCC)   History of Present Illness/ Hospital Course [name] Summary:    Stacey Fitzgerald is a 83 yof with PMH/o; hypertension, hyperlipidemia, memory deficit,?  Hypothyroidism... presenting for anemia. Denies any chest pain or shortness of breath, complaining of generalized weakness.,  "feeling drowsy, sleepy", generalized weakness. Denied having any dark, bloody stool, denies any bloody urine or vomiting blood.,  Not chronically on blood thinners. Sent in from PCP for low hemoglobin level.  ED evaluation/course: Blood pressure 129/63, pulse 61, temperature 98.3 F (36.8 C), temperature source Oral, resp. rate 18, height 5\' 8"  (1.727 m), weight 72.6 kg, SpO2 97%.  LABs; CBC; Hgb 6.4, platelets 405, BUN 21, creatinine 1.06, glucose 101 Iron 13, iron saturation 4, Hemoccult positive  EDP consulted gastroenterologist, transfusing 2U PRBC, initiating IV PPI  Requested to be admitted to medical anemia, GI bleed--subsequent finding of colonic mass status postresection by general  surgery  -------------------------------------------------------------------------------------------------------------------------------  New diagnosis of ascending colon mass - GI performed colonoscopy with biopsies 3/19.  Pathology pending.  -   S/P robotic assisted laparoscopic right hemicolectomy 3/21.   - bowel movement or gas as of yet.  Ambulating well.  Pain appears somewhat controlled.   - Advanced to full liquid diet.    -Opioids as needed.  Leukocytosis improving.   - Patient tolerating p.o. and having bowel movements.   Acute on chronic iron deficiency anemia - Secondary to chronic blood loss.  Status post 2 units packed RBCs.  Initial iron studies showing iron 9, sat 2, ferritin 4, normal TIBC suggestive severe iron deficiency.  S/P IV iron.  Will recheck CBC in AM.   Acute on chronic lower GI bleed - Status post EGD and colonoscopy 3/19.  EGD showing no acute bleed.  Colonoscopy noting newly diagnosed malignancy.   Mild dementia - Monitoring closely.  Patient very pleasant with some possible memory issues.    Hypertension - Resuming home medication regimen.   Physical debilitation and muscle weakness - PT eval recommending STR.   Goals of care - Working closely with case management on disposition planning to D/C to SNF    Discharge Instructions:   Discharge Instructions     Activity as tolerated - No restrictions   Complete by: As directed    Call MD for:  difficulty breathing, headache or visual disturbances   Complete by: As directed    Call MD for:  persistant dizziness or light-headedness   Complete by: As directed    Call MD for:  persistant nausea and vomiting   Complete by: As directed    Call MD for:  redness, tenderness, or signs of infection (pain, swelling, redness, odor or green/yellow discharge around incision site)   Complete by: As directed    Call MD for:  severe uncontrolled pain   Complete by: As directed    Call MD for:  temperature  >100.4   Complete by: As directed    Diet - low sodium heart healthy   Complete by: As directed    Discharge instructions   Complete by: As directed    Follow-up with PCP in 1 week Follow-up with general surgery 1-2 weeks   Increase activity slowly   Complete by: As directed       Allergies as of 10/16/2023       Reactions   Cephalexin Hives   Patient reports that she is not allergic to this medication but she has not taken it since it was listed as an allergy        Medication List     TAKE these medications    acetaminophen 500 MG tablet Commonly known as: TYLENOL Take 500 mg by mouth every 6 (six) hours as needed.   docusate sodium 100 MG capsule Commonly known as: COLACE Take 1 capsule (100 mg total) by mouth 2 (two) times daily.   ferrous sulfate 325 (65 FE) MG tablet Take 1 tablet (325 mg total) by mouth 2 (two) times daily with a meal.   lactobacillus acidophilus & bulgar chewable tablet Chew 1 tablet by mouth 3 (three) times daily with meals for 10 days.   lisinopril 20 MG tablet Commonly known as: ZESTRIL Take 1 tablet (20 mg total) by mouth 2 (two) times daily.   ondansetron 4 MG tablet Commonly known as: ZOFRAN Take 1 tablet (4 mg total) by mouth every 6 (six) hours as needed for nausea.   oxyCODONE 5 MG immediate release tablet Commonly known as: Oxy IR/ROXICODONE Take 1 tablet (5 mg total) by mouth every 4 (four) hours as needed for up to 5 days for moderate pain (pain score 4-6).   pantoprazole 40 MG tablet Commonly known as: Protonix Take 1 tablet (40 mg total) by mouth 2 (two) times daily.   simvastatin 20 MG tablet Commonly known as: ZOCOR Take 1 tablet (20 mg total) by mouth at bedtime.   traZODone 50 MG tablet Commonly known as: DESYREL Take 0.5 tablets (25 mg total) by mouth at bedtime as needed for up to 5 days for sleep.         Contact information for after-discharge care     Destination     [facility]  Preferred SNF .   Service: Skilled Nursing Contact information: [address] [place] [address] [phone]                    Allergies  Allergen Reactions   Cephalexin Hives    Patient reports that she is not allergic to this medication but she has not taken it since it was listed as an allergy     Quit smoking:  It is highly recommended that all people especially deals with diabetes to quit smoking or stay away from smoking, avoid secondhand smoking.   Vaccines:  Also highly recommended update your vaccines requirement, including SARS-CoV-2 , yearly  flu vaccine and pneumonia vaccine at least  every 5 years.      Exercise: If you are able: 30 -60 minutes a day, 4 days a week, or 150 minutes a week.  The longer the better.  Combine stretch, strength, and aerobic activities.  If you were told in the past that you have high risk for cardiovascular diseases, you may seek evaluation by your heart doctor prior to initiating moderate to intense exercise programs.    One other important lifestyle recommendation is to ensure adequate sleep - at least 6-7 hours of uninterrupted sleep at night.  Procedures /Studies:   DG CHEST PORT 1 VIEW Result Date: 10/10/2023 CLINICAL DATA:  Fever EXAM: PORTABLE CHEST 1 VIEW COMPARISON:  10/10/2023 FINDINGS: Single frontal view of the chest demonstrates an unremarkable cardiac silhouette. No airspace disease, effusion, or pneumothorax. Stable hiatal hernia. IMPRESSION: 1. No acute intrathoracic process. 2. Hiatal hernia. Electronically Signed   By: Sharlet Salina M.D.   On: 10/10/2023 19:58   CT ABDOMEN PELVIS W CONTRAST Result Date: 10/10/2023 CLINICAL DATA:  Colon cancer staging.  * Tracking Code: BO * EXAM: CT ABDOMEN AND PELVIS WITH CONTRAST TECHNIQUE: Multidetector CT imaging of the abdomen and pelvis was performed using the standard protocol following bolus administration of intravenous contrast. RADIATION DOSE REDUCTION:  This exam was performed according to the departmental dose-optimization program which includes automated exposure control, adjustment of the mA and/or kV according to patient size and/or use of iterative reconstruction technique. CONTRAST:  OMNIPAQUE IOHEXOL 300 MG/ML  SOLN COMPARISON:  None Available. FINDINGS: Lower chest: Motion seen along the IMPRESSION: Focal lobular mass along the right side of the colon near and just above the ileocecal bowel. Please correlate for location of neoplasm. A few borderline enlarged lymph nodes are seen in the medially adjacent mesentery in the right lower quadrant. These are worrisome lesions for spread of disease. Tiny segment 8 dome liver lesion identified, too small to characterize. Also similar tiny cystic foci in the spleen. Please correlate with any prior examination to assess stability or additional workup versus short follow-up. Note the dome of the liver is clipped off the edge of the film limiting evaluation for subtle lesion in those locations. There is also significant motion throughout the examination. Complex cystic lesion right adnexa could be ovarian with a cystic component and dystrophic calcification. Please correlate with any prior to assess stability or additional workup with ultrasound versus follow-up in 3-6 months. Large hiatal hernia. Left-sided colonic diverticula. Pessary. Electronically Signed   By: Karen Kays M.D.   On: 10/10/2023 16:02    Subjective:   Patient was seen and examined 10/16/2023, 7:37 AM Patient stable today. No acute distress.  No issues overnight Stable for discharge.  Discharge Exam:    Vitals:   10/15/23 2036 10/15/23 2107 10/15/23 2108 10/16/23 0545  BP: (!) 157/69 (!) 152/67 (!) 152/67 (!) 156/77  Pulse: (!) 106  97 94  Resp: 18  18 18   Temp: 98.2 F (36.8 C)  98.3 F (36.8 C) 98.1 F (36.7 C)  TempSrc:   Oral   SpO2: 97%  95% 97%  Weight:    64.5 kg  Height:        General: Pt lying comfortably in  bed & appears in no obvious distress. Cardiovascular: S1 & S2 heard, RRR, S1/S2 +. No murmurs, rubs, gallops or clicks. No JVD or pedal edema. Respiratory: Clear to auscultation without wheezing, rhonchi or crackles. No increased work of breathing. Abdominal:  Non-distended, non-tender & soft. No organomegaly  or masses appreciated. Normal bowel sounds heard. CNS: Alert and oriented. No focal deficits. Extremities: no edema, no cyanosis      The results of significant diagnostics from this hospitalization (including imaging, microbiology, ancillary and laboratory) are listed below for reference.      Microbiology:   Recent Results (from the past 240 hours)  Culture, blood (Routine X 2) w Reflex to ID Panel     Status: None   Collection Time: 10/10/23  4:47 PM   Specimen: BLOOD  Result Value Ref Range Status   Specimen Description BLOOD BLOOD RIGHT HAND  Final   Special Requests   Final    BOTTLES DRAWN AEROBIC AND ANAEROBIC Blood Culture adequate volume   Culture   Final    NO GROWTH 5 DAYS Performed at [facility], [address] [place], [place] [address]    Report Status 10/15/2023 FINAL  Final  Culture, blood (Routine X 2) w Reflex to ID Panel     Status: None   Collection Time: 10/10/23  4:47 PM   Specimen: Left Antecubital; Blood  Result Value Ref Range Status   Specimen Description   Final    LEFT ANTECUBITAL BOTTLES DRAWN AEROBIC AND ANAEROBIC   Special Requests Blood Culture adequate volume  Final   Culture   Final    NO GROWTH 5 DAYS Performed at [facility], [address] [place], [place] [address]    Report Status 10/15/2023 FINAL  Final  MRSA Next Gen by PCR, Nasal     Status: None   Collection Time: 10/11/23  4:00 AM   Specimen: Nasal Mucosa; Nasal Swab  Result Value Ref Range Status   MRSA by PCR Next Gen NOT DETECTED NOT DETECTED Final    Comment: (NOTE) The GeneXpert MRSA Assay (FDA approved for NASAL specimens only), is one component of a  comprehensive MRSA colonization surveillance program. It is not intended to diagnose MRSA infection nor to guide or monitor treatment for MRSA infections. Test performance is not FDA approved in patients less than 74 years old. Performed at [facility], [address], [place], [place] [address]   Urine Culture     Status: None   Collection Time: 10/11/23  8:37 AM   Specimen: PATH Cytology Urine  Result Value Ref Range Status   Specimen Description   Final    URINE, CLEAN CATCH Performed at [facility], [address], [place], [place] [address]    Special Requests   Final    NONE Performed at [facility], [address] [place], [place] [address]    Culture   Final    NO GROWTH Performed at [facility], [address] [place], [place] [address]    Report Status 10/12/2023 FINAL  Final     Labs:   CBC: Recent Labs  Lab 10/11/23 0447 10/12/23 0417 10/13/23 0421 10/14/23 0439 10/15/23 0442  WBC 12.5* 15.6* 11.3* 11.9* 11.7*  NEUTROABS  --  13.2* 8.9*  --   --   HGB 8.5* 8.4* 8.4* 8.9* 8.9*  HCT 29.7* 29.3* 29.6* 30.0* 30.3*  MCV 74.6* 74.4* 75.9* 74.4* 74.6*  PLT 348 369 365 390 354   Basic Metabolic Panel: Recent Labs  Lab 10/11/23 0447 10/12/23 0417 10/13/23 0421 10/14/23 0439 10/15/23 0442  NA 133* 134* 135 135 138  K 3.4* 4.0 3.6 3.6 3.7  CL 103 107 106 104 106  CO2 20* 19* 22 23 23   GLUCOSE 87 104* 90 99 99  BUN 16 21 19 15 16   CREATININE 1.16* 1.16* 0.94 0.86 0.83  CALCIUM 8.1* 8.9 8.6* 8.7* 8.9  MG 1.7 1.8  --   --   --   PHOS  --  3.6  --   --   --    Liver Function Tests: Recent Labs  Lab 10/11/23 0447  AST 10*  ALT 9  ALKPHOS 49  BILITOT 0.7  PROT 6.2*  ALBUMIN 3.1*   BNP (last 3 results) No results for input(s): "BNP" in the last 8760 hours. Cardiac Enzymes: No results for input(s): "CKTOTAL", "CKMB", "CKMBINDEX", "TROPONINI" in the last 168 hours. CBG: Recent Labs  Lab 10/09/23 0758 10/12/23 0738 10/13/23 0737  10/14/23 0748 10/15/23 0755  GLUCAP 97 100* 84 109* 97    Urinalysis    Component Value Date/Time   COLORURINE YELLOW 10/11/2023 0830   APPEARANCEUR HAZY (A) 10/11/2023 0830   LABSPEC >1.046 (H) 10/11/2023 0830   PHURINE 5.0 10/11/2023 0830   GLUCOSEU NEGATIVE 10/11/2023 0830   HGBUR SMALL (A) 10/11/2023 0830   BILIRUBINUR NEGATIVE 10/11/2023 0830   KETONESUR NEGATIVE 10/11/2023 0830   PROTEINUR 30 (A) 10/11/2023 0830   NITRITE NEGATIVE 10/11/2023 0830   LEUKOCYTESUR TRACE (A) 10/11/2023 0830         Time coordinating discharge: 45 minutes  SIGNED: Kendell Bane, MD, FACP, FHM. [facility],  Please use [email or web address] to Page If 7PM-7AM, please contact night-coverage [email or web address],  10/16/2023, 7:37 AM

## 2023-10-16 NOTE — TOC Progression Note (Signed)
 Transition of Care [facility]) - Progression Note    Patient Details  Name: Stacey Fitzgerald MRN: [id number] Date of Birth: 15-Jul-1941  [facility]) CM/SW Contact  Isabella Bowens, [place] Phone Number: 10/16/2023, 2:00 PM  Clinical Narrative:    Patient [name] is still pending . CSW sent a secure message to MD about requested peer to peer from patient insurance medical doctor. Per patient insurance, "[employer] ge[employer] is offering a peer to peer before making a final determination. The number to call is [phone] opt 5. The deadline is 3/27 at 9am. Member ID is [id number] ". TOC will continue to follow   Expected Discharge Plan: Skilled Nursing Facility Barriers to Discharge: Insurance Authorization  Expected Discharge Plan and Services In-house Referral: Clinical Social Work Discharge Planning Services: CM Consult Post Acute Care Choice: Skilled Nursing Facility Living arrangements for the past 2 months: Single Family Home Expected Discharge Date: 10/16/23                                     Social Determinants of Health (SDOH) Interventions SDOH Screenings   Food Insecurity: No Food Insecurity (10/08/2023)  Housing: Low Risk  (10/08/2023)  Transportation Needs: Unmet Transportation Needs (10/08/2023)  Utilities: Not At Risk (10/08/2023)  Alcohol Screen: Low Risk  (11/20/2022)  Depression (PHQ2-9): Low Risk  (10/03/2023)  Financial Resource Strain: Low Risk  (10/02/2023)  Physical Activity: Inactive (10/02/2023)  Social Connections: Moderately Integrated (10/08/2023)  Stress: No Stress Concern Present (10/02/2023)  Tobacco Use: Low Risk  (10/11/2023)    Readmission Risk Interventions    10/16/2023    1:59 PM  Readmission Risk Prevention Plan  Medication Screening Complete  Transportation Screening Complete

## 2023-10-16 NOTE — Plan of Care (Signed)

## 2023-10-16 NOTE — Care Management Important Message (Signed)
 Important Message  Patient Details  Name: Stacey Fitzgerald MRN: [id number] Date of Birth: [birth year]   Important Message Given:  Yes - Medicare IM     Corey Harold 10/16/2023, 11:42 AM

## 2023-10-16 NOTE — Progress Notes (Signed)
 [facility]  Patient has follow up next week.  Future Appointments  Date Time Provider Department Center  10/21/2023  8:15 AM Doreatha Massed, MD [facility] None  10/21/2023  9:00 AM [facility] LAB [facility] None  10/24/2023  2:45 PM Lucretia Roers, MD [facility] None  11/25/2023  3:10 PM [facility]-ANNUAL WELLNESS VISIT [facility] P[facility]   Algis Greenhouse, MD [facility] [address] [place], [place] [address] [phone] (office)

## 2023-10-16 NOTE — Progress Notes (Signed)
 Physical Therapy Treatment Patient Details Name: Stacey Fitzgerald MRN: [id number] DOB: Jul 22, 1941 Today's Date: 10/16/2023   History of Present Illness Stacey Fitzgerald is a 27 yof with PMH/o; hypertension, hyperlipidemia, memory deficit,?  Hypothyroidism... presenting for anemia.  Denies any chest pain or shortness of breath, complaining of generalized weakness.,  "feeling drowsy, sleepy", generalized weakness.  Denied having any dark, bloody stool, denies any bloody urine or vomiting blood.,  Not chronically on blood thinners.  Sent in from PCP for low hemoglobin level. S/p abdominal surgery 3/21. Stacey Fitzgerald is a 83 y.o. female with a history of colonic polyps and microcytic anemia who was admitted on 3/18 for anemia. That day she received 2 units of blood, and the following day 3/19 was seen by GI who conducted upper endoscopy which was normal and colonoscopy which showed a mass in the ascending colon. Her son, who was present, noted she was unaware of the findings and waiting on the GI physician.  This morning she denies any abdominal pain or discomfort, dark, bloody stools or BRBPR. She denies fever, chills, lightheadedness, shortness of breath, but noted she needed help getting up to use the restroom this morning, which is not usual. She said otherwise, she was feeling good and hoping to be discharged soon. Per MD note.    PT Comments  Patient presents in chair and agreeable for therapy.  Patient ambulates with slow labored movement, occasional near loss of balance requiring Min/mod assist to avoid falling, requires verbal/tactile cueing for proper placement on RW with fair carryover and limited mostly due to fatigue and fall risk.  Patient requested to go back to bed after therapy due to fatigue requiring Mod assist to lift legs onto bed. Patient will benefit from continued skilled physical therapy in hospital and recommended venue below to increase strength, balance, endurance for safe  ADLs and gait.     If plan is discharge home, recommend the following: A little help with bathing/dressing/bathroom;Assistance with cooking/housework;Help with stairs or ramp for entrance;A lot of help with walking and/or transfers   Can travel by private vehicle     Yes  Equipment Recommendations  None recommended by PT    Recommendations for Other Services       Precautions / Restrictions Precautions Precautions: Fall Recall of Precautions/Restrictions: Intact Restrictions Weight Bearing Restrictions Per Provider Order: No     Mobility  Bed Mobility Overal bed mobility: Needs Assistance Bed Mobility: Sit to Supine       Sit to supine: Mod assist   General bed mobility comments: had difficulty moving legs onto bed due to weakness    Transfers Overall transfer level: Needs assistance Equipment used: Rolling walker (2 wheels) Transfers: Sit to/from Stand, Bed to chair/wheelchair/BSC Sit to Stand: Min assist   Step pivot transfers: Min assist       General transfer comment: unsteady labored movement with near loss of balance when stepping backwards/.    Ambulation/Gait Ambulation/Gait assistance: Min assist, Mod assist Gait Distance (Feet): 35 Feet Assistive device: Rolling walker (2 wheels) Gait Pattern/deviations: Decreased step length - right, Decreased step length - left, Decreased stride length, Trunk flexed Gait velocity: decreased     General Gait Details: slow labored cadence requiring verbal/tactile cueing for proper hand placement on RW with fair carryover, limited mostly due to fatigue and fall risk   Stairs             Wheelchair Mobility     Tilt Bed  Modified Rankin (Stroke Patients Only)       Balance Overall balance assessment: Needs assistance Sitting-balance support: Feet supported, No upper extremity supported Sitting balance-Leahy Scale: Fair Sitting balance - Comments: fair/good seated at EOB   Standing balance  support: Reliant on assistive device for balance, During functional activity, Bilateral upper extremity supported Standing balance-Leahy Scale: Poor Standing balance comment: fair/poor using RW                            Communication Communication Communication: No apparent difficulties  Cognition Arousal: Alert Behavior During Therapy: WFL for tasks assessed/performed   PT - Cognitive impairments: No apparent impairments                         Following commands: Intact      Cueing Cueing Techniques: Verbal cues, Tactile cues  Exercises General Exercises - Lower Extremity Long Arc Quad: Seated, AROM, Strengthening, Both, 10 reps Hip Flexion/Marching: Seated, AROM, Strengthening, Both, 10 reps Toe Raises: Seated, AROM, Strengthening, Both, 10 reps Heel Raises: Seated, AROM, Strengthening, Both, 10 reps    General Comments        Pertinent Vitals/Pain Pain Assessment Pain Assessment: Faces Faces Pain Scale: Hurts a little bit Pain Location: surgical site Pain Descriptors / Indicators: Discomfort, Sore Pain Intervention(s): Limited activity within patient's tolerance, Monitored during session, Repositioned    Home Living                          Prior Function            PT Goals (current goals can now be found in the care plan section) Acute Rehab PT Goals Patient Stated Goal: return home after rehab PT Goal Formulation: With patient Time For Goal Achievement: 10/28/23 Potential to Achieve Goals: Good Progress towards PT goals: Progressing toward goals    Frequency    Min 3X/week      PT Plan      Co-evaluation              AM-PAC PT "6 Clicks" Mobility   Outcome Measure  Help needed turning from your back to your side while in a flat bed without using bedrails?: A Little Help needed moving from lying on your back to sitting on the side of a flat bed without using bedrails?: A Little Help needed moving to and  from a bed to a chair (including a wheelchair)?: A Little Help needed standing up from a chair using your arms (e.g., wheelchair or bedside chair)?: A Little Help needed to walk in hospital room?: A Lot Help needed climbing 3-5 steps with a railing? : A Lot 6 Click Score: 16    End of Session   Activity Tolerance: Patient tolerated treatment well;Patient limited by fatigue Patient left: in bed;with call bell/phone within reach Nurse Communication: Mobility status PT Visit Diagnosis: Difficulty in walking, not elsewhere classified (R26.2);Other abnormalities of gait and mobility (R26.89);Muscle weakness (generalized) (M62.81)     Time: 1610-9604 PT Time Calculation (min) (ACUTE ONLY): 23 min  Charges:    $Gait Training: 8-22 mins $Therapeutic Exercise: 8-22 mins PT General Charges $$ ACUTE PT VISIT: 1 Visit                     3:45 PM, 10/16/23 Ocie Bob, MPT Physical Therapist with [facility] [phone] office [phone] mobile  phone

## 2023-10-16 NOTE — Plan of Care (Signed)

## 2023-10-17 ENCOUNTER — Ambulatory Visit: Admitting: Physician Assistant

## 2023-10-17 DIAGNOSIS — D649 Anemia, unspecified: Secondary | ICD-10-CM | POA: Diagnosis not present

## 2023-10-17 LAB — GLUCOSE, CAPILLARY: Glucose-Capillary: 108 mg/dL — ABNORMAL HIGH (ref 70–99)

## 2023-10-17 NOTE — Progress Notes (Signed)
 Pt discharged via [facility] to [facility] by unit staff. All personal belongings were in pt's possession at time of discharge. Pt's son Kathlene November called and notified of discharge.

## 2023-10-17 NOTE — Plan of Care (Signed)
   Problem: Activity: Goal: Risk for activity intolerance will decrease Outcome: Progressing   Problem: Coping: Goal: Level of anxiety will decrease Outcome: Progressing

## 2023-10-17 NOTE — Discharge Summary (Signed)
 Physician Discharge Summary [facility]    Patient: Stacey Fitzgerald                   Admit date: 10/08/2023   DOB: 04/09/1941             Discharge date:10/17/2023/9:56 AM ZOX:[id number]                          PCP: Debera Lat, PA-C  Disposition: Patient was seen and examined, stable to be discharged to SNF today   Recommendations for Outpatient Follow-up:   Follow up: With PCP in 1 week Follow-up with general surgery 1-2 weeks Follow-up with oncologist Dr. Ellin Saba in 1 week Advance diet as tolerated  Discharge Condition: Stable   Code Status:   Code Status: Full Code  Diet recommendation:  Advance as tolerated per general surgery   Discharge Diagnoses:    Principal Problem:   Symptomatic anemia Active Problems:   Pure hypercholesterolemia   Hypothyroidism   Mild dementia without behavioral disturbance, psychotic disturbance, mood disturbance, or anxiety (HCC)   Essential hypertension   Colon cancer, ascending (HCC)   History of Present Illness/ Hospital Course [name] Summary:    [name] is a 83 yof with PMH/o; hypertension, hyperlipidemia, memory deficit,?  Hypothyroidism... presenting for anemia. Denies any chest pain or shortness of breath, complaining of generalized weakness.,  "feeling drowsy, sleepy", generalized weakness. Denied having any dark, bloody stool, denies any bloody urine or vomiting blood.,  Not chronically on blood thinners. Sent in from PCP for low hemoglobin level.  ED evaluation/course: Blood pressure 129/63, pulse 61, temperature 98.3 F (36.8 C), temperature source Oral, resp. rate 18, height 5\' 8"  (1.727 m), weight 72.6 kg, SpO2 97%.  LABs; CBC; Hgb 6.4, platelets 405, BUN 21, creatinine 1.06, glucose 101 Iron 13, iron saturation 4, Hemoccult positive  EDP consulted gastroenterologist, transfusing 2U PRBC, initiating IV PPI  Requested to be admitted to medical anemia, GI bleed--subsequent finding of  colonic mass status postresection by general surgery  -------------------------------------------------------------------------------------------------------------------------------  New diagnosis of ascending colon mass - GI performed colonoscopy with biopsies 3/19.  Pathology pending.  -   S/P robotic assisted laparoscopic right hemicolectomy 3/21.   - bowel movement or gas as of yet.  Ambulating well.  Pain appears somewhat controlled.   - Advanced to full liquid diet.    -Opioids as needed.  Leukocytosis improving.   - Patient tolerating p.o. and having bowel movements.   Acute on chronic iron deficiency anemia - Secondary to chronic blood loss.  Status post 2 units packed RBCs.  Initial iron studies showing iron 9, sat 2, ferritin 4, normal TIBC suggestive severe iron deficiency.  S/P IV iron.  Will recheck CBC in AM.   Acute on chronic lower GI bleed - Status post EGD and colonoscopy 3/19.  EGD showing no acute bleed.  Colonoscopy noting newly diagnosed malignancy.   Mild dementia - Monitoring closely.  Patient very pleasant with some possible memory issues.    Hypertension - Resuming home medication regimen.   Physical debilitation and muscle weakness - PT eval recommending STR.   Goals of care - Working closely with case management on disposition planning to D/C to SNF    Discharge Instructions:   Discharge Instructions     Activity as tolerated - No restrictions   Complete by: As directed    Call MD for:  difficulty breathing, headache or visual disturbances  Complete by: As directed    Call MD for:  persistant dizziness or light-headedness   Complete by: As directed    Call MD for:  persistant nausea and vomiting   Complete by: As directed    Call MD for:  redness, tenderness, or signs of infection (pain, swelling, redness, odor or green/yellow discharge around incision site)   Complete by: As directed    Call MD for:  severe uncontrolled pain   Complete by: As  directed    Call MD for:  temperature >100.4   Complete by: As directed    Diet - low sodium heart healthy   Complete by: As directed    Discharge instructions   Complete by: As directed    Follow-up with PCP in 1 week Follow-up with general surgery 1-2 weeks   Increase activity slowly   Complete by: As directed       Allergies as of 10/17/2023       Reactions   Cephalexin Hives   Patient reports that she is not allergic to this medication but she has not taken it since it was listed as an allergy        Medication List     TAKE these medications    acetaminophen 500 MG tablet Commonly known as: TYLENOL Take 500 mg by mouth every 6 (six) hours as needed.   docusate sodium 100 MG capsule Commonly known as: COLACE Take 1 capsule (100 mg total) by mouth 2 (two) times daily.   ferrous sulfate 325 (65 FE) MG tablet Take 1 tablet (325 mg total) by mouth 2 (two) times daily with a meal.   lactobacillus acidophilus & bulgar chewable tablet Chew 1 tablet by mouth 3 (three) times daily with meals for 10 days.   lisinopril 20 MG tablet Commonly known as: ZESTRIL Take 1 tablet (20 mg total) by mouth 2 (two) times daily.   ondansetron 4 MG tablet Commonly known as: ZOFRAN Take 1 tablet (4 mg total) by mouth every 6 (six) hours as needed for nausea.   oxyCODONE 5 MG immediate release tablet Commonly known as: Oxy IR/ROXICODONE Take 1 tablet (5 mg total) by mouth every 4 (four) hours as needed for up to 5 days for moderate pain (pain score 4-6).   pantoprazole 40 MG tablet Commonly known as: Protonix Take 1 tablet (40 mg total) by mouth 2 (two) times daily.   simvastatin 20 MG tablet Commonly known as: ZOCOR Take 1 tablet (20 mg total) by mouth at bedtime.   traZODone 50 MG tablet Commonly known as: DESYREL Take 0.5 tablets (25 mg total) by mouth at bedtime as needed for up to 5 days for sleep.         Contact information for after-discharge care      Destination     [facility] .   Service: Skilled Nursing Contact information: [address] [place] [address] [phone]                    Allergies  Allergen Reactions   Cephalexin Hives    Patient reports that she is not allergic to this medication but she has not taken it since it was listed as an allergy     Quit smoking:  It is highly recommended that all people especially deals with diabetes to quit smoking or stay away from smoking, avoid secondhand smoking.   Vaccines:  Also highly recommended update your vaccines requirement, including  SARS-CoV-2 , yearly  flu vaccine and pneumonia vaccine at least every 5 years.      Exercise: If you are able: 30 -60 minutes a day, 4 days a week, or 150 minutes a week.  The longer the better.  Combine stretch, strength, and aerobic activities.  If you were told in the past that you have high risk for cardiovascular diseases, you may seek evaluation by your heart doctor prior to initiating moderate to intense exercise programs.    One other important lifestyle recommendation is to ensure adequate sleep - at least 6-7 hours of uninterrupted sleep at night.  Procedures /Studies:   DG CHEST PORT 1 VIEW Result Date: 10/10/2023 CLINICAL DATA:  Fever EXAM: PORTABLE CHEST 1 VIEW COMPARISON:  10/10/2023 FINDINGS: Single frontal view of the chest demonstrates an unremarkable cardiac silhouette. No airspace disease, effusion, or pneumothorax. Stable hiatal hernia. IMPRESSION: 1. No acute intrathoracic process. 2. Hiatal hernia. Electronically Signed   By: Sharlet Salina M.D.   On: 10/10/2023 19:58   CT ABDOMEN PELVIS W CONTRAST Result Date: 10/10/2023 CLINICAL DATA:  Colon cancer staging.  * Tracking Code: BO * EXAM: CT ABDOMEN AND PELVIS WITH CONTRAST TECHNIQUE: Multidetector CT imaging of the abdomen and pelvis was performed using the standard protocol following bolus administration of  intravenous contrast. RADIATION DOSE REDUCTION: This exam was performed according to the departmental dose-optimization program which includes automated exposure control, adjustment of the mA and/or kV according to patient size and/or use of iterative reconstruction technique. CONTRAST:  OMNIPAQUE IOHEXOL 300 MG/ML  SOLN COMPARISON:  None Available. FINDINGS: Lower chest: Motion seen along the IMPRESSION: Focal lobular mass along the right side of the colon near and just above the ileocecal bowel. Please correlate for location of neoplasm. A few borderline enlarged lymph nodes are seen in the medially adjacent mesentery in the right lower quadrant. These are worrisome lesions for spread of disease. Tiny segment 8 dome liver lesion identified, too small to characterize. Also similar tiny cystic foci in the spleen. Please correlate with any prior examination to assess stability or additional workup versus short follow-up. Note the dome of the liver is clipped off the edge of the film limiting evaluation for subtle lesion in those locations. There is also significant motion throughout the examination. Complex cystic lesion right adnexa could be ovarian with a cystic component and dystrophic calcification. Please correlate with any prior to assess stability or additional workup with ultrasound versus follow-up in 3-6 months. Large hiatal hernia. Left-sided colonic diverticula. Pessary. Electronically Signed   By: Karen Kays M.D.   On: 10/10/2023 16:02    Subjective:   Patient was seen and examined 10/17/2023, 9:56 AM Patient stable today. No acute distress.  No issues overnight Stable for discharge.  Discharge Exam:    Vitals:   10/16/23 0545 10/16/23 2055 10/17/23 0553 10/17/23 0800  BP: (!) 156/77 (!) 126/52 124/60 (!) 118/49  Pulse: 94 83 73 86  Resp: 18 16 16 18   Temp: 98.1 F (36.7 C) 99.2 F (37.3 C) 98.4 F (36.9 C) 97.6 F (36.4 C)  TempSrc:  Oral Oral Oral  SpO2: 97% 95% 96% 99%   Weight: 64.5 kg  64.4 kg   Height:             General:  AAO x 3,  cooperative, no distress;   HEENT:  Normocephalic, PERRL, otherwise with in Normal limits   Neuro:  CNII-XII intact. , normal motor and sensation, reflexes intact  Lungs:   Clear to auscultation BL, Respirations unlabored,  No wheezes / crackles  Cardio:    S1/S2, RRR, No murmure, No Rubs or Gallops   Abdomen:  Soft, non-tender, bowel sounds active all four quadrants, no guarding or peritoneal signs.  Muscular  skeletal:  Limited exam -global generalized weaknesses - in bed, able to move all 4 extremities,   2+ pulses,  symmetric, No pitting edema  Skin:  Dry, warm to touch, negative for any Rashes,  Wounds: Please see nursing documentation          The results of significant diagnostics from this hospitalization (including imaging, microbiology, ancillary and laboratory) are listed below for reference.      Microbiology:   Recent Results (from the past 240 hours)  Culture, blood (Routine X 2) w Reflex to ID Panel     Status: None   Collection Time: 10/10/23  4:47 PM   Specimen: BLOOD  Result Value Ref Range Status   Specimen Description BLOOD BLOOD RIGHT HAND  Final   Special Requests   Final    BOTTLES DRAWN AEROBIC AND ANAEROBIC Blood Culture adequate volume   Culture   Final    NO GROWTH 5 DAYS Performed at [facility], [address], [place], [place] [address]    Report Status 10/15/2023 FINAL  Final  Culture, blood (Routine X 2) w Reflex to ID Panel     Status: None   Collection Time: 10/10/23  4:47 PM   Specimen: Left Antecubital; Blood  Result Value Ref Range Status   Specimen Description   Final    LEFT ANTECUBITAL BOTTLES DRAWN AEROBIC AND ANAEROBIC   Special Requests Blood Culture adequate volume  Final   Culture   Final    NO GROWTH 5 DAYS Performed at [facility], [address] [place], [place] [address]    Report Status 10/15/2023 FINAL  Final  MRSA Next Gen by PCR, Nasal      Status: None   Collection Time: 10/11/23  4:00 AM   Specimen: Nasal Mucosa; Nasal Swab  Result Value Ref Range Status   MRSA by PCR Next Gen NOT DETECTED NOT DETECTED Final    Comment: (NOTE) The GeneXpert MRSA Assay (FDA approved for NASAL specimens only), is one component of a comprehensive MRSA colonization surveillance program. It is not intended to diagnose MRSA infection nor to guide or monitor treatment for MRSA infections. Test performance is not FDA approved in patients less than 40 years old. Performed at [facility], [address], [place], [place] [address]   Urine Culture     Status: None   Collection Time: 10/11/23  8:37 AM   Specimen: PATH Cytology Urine  Result Value Ref Range Status   Specimen Description   Final    URINE, CLEAN CATCH Performed at [facility], [address] [place], [place] [address]    Special Requests   Final    NONE Performed at [facility], [address], [place], [place] [address]    Culture   Final    NO GROWTH Performed at [facility], [address] [place], [place] [address]    Report Status 10/12/2023 FINAL  Final     Labs:   CBC: Recent Labs  Lab 10/11/23 0447 10/12/23 0417 10/13/23 0421 10/14/23 0439 10/15/23 0442  WBC 12.5* 15.6* 11.3* 11.9* 11.7*  NEUTROABS  --  13.2* 8.9*  --   --   HGB 8.5* 8.4* 8.4* 8.9* 8.9*  HCT 29.7* 29.3* 29.6* 30.0* 30.3*  MCV 74.6* 74.4* 75.9* 74.4* 74.6*  PLT 348 369 365 390 354   Basic Metabolic Panel: Recent Labs  Lab 10/11/23 0447 10/12/23 0417 10/13/23 0421 10/14/23 0439 10/15/23 0442  NA 133* 134* 135 135 138  K 3.4* 4.0 3.6 3.6 3.7  CL 103 107 106 104 106  CO2 20* 19* 22 23 23   GLUCOSE 87 104* 90 99 99  BUN 16 21 19 15 16   CREATININE 1.16* 1.16* 0.94 0.86 0.83  CALCIUM 8.1* 8.9 8.6* 8.7* 8.9  MG 1.7 1.8  --   --   --   PHOS  --  3.6  --   --   --    Liver Function Tests: Recent Labs  Lab 10/11/23 0447  AST 10*  ALT 9  ALKPHOS 49  BILITOT 0.7  PROT  6.2*  ALBUMIN 3.1*   BNP (last 3 results) No results for input(s): "BNP" in the last 8760 hours. Cardiac Enzymes: No results for input(s): "CKTOTAL", "CKMB", "CKMBINDEX", "TROPONINI" in the last 168 hours. CBG: Recent Labs  Lab 10/13/23 0737 10/14/23 0748 10/15/23 0755 10/16/23 0745 10/17/23 0735  GLUCAP 84 109* 97 107* 108*    Urinalysis    Component Value Date/Time   COLORURINE YELLOW 10/11/2023 0830   APPEARANCEUR HAZY (A) 10/11/2023 0830   LABSPEC >1.046 (H) 10/11/2023 0830   PHURINE 5.0 10/11/2023 0830   GLUCOSEU NEGATIVE 10/11/2023 0830   HGBUR SMALL (A) 10/11/2023 0830   BILIRUBINUR NEGATIVE 10/11/2023 0830   KETONESUR NEGATIVE 10/11/2023 0830   PROTEINUR 30 (A) 10/11/2023 0830   NITRITE NEGATIVE 10/11/2023 0830   LEUKOCYTESUR TRACE (A) 10/11/2023 0830         Time coordinating discharge: 45 minutes  SIGNED: Kendell Bane, MD, FACP, FHM. [facility],  Please use [email or web address] to Page If 7PM-7AM, please contact night-coverage [email or web address],  10/17/2023, 9:56 AM

## 2023-10-17 NOTE — Progress Notes (Signed)
 Pt ambulated in hallway using FWW and standby assist, total 150 ft, tolerated well. Required reminders to stand up straight and hold head up while ambulating. No c/o knee pain this am. Up in recliner at this time eating breakfast.

## 2023-10-17 NOTE — TOC Transition Note (Signed)
 Transition of Care [facility]) - Discharge Note   Patient Details  Name: Stacey Fitzgerald MRN: [id number] Date of Birth: 1940-09-23  Transition of Care [facility]) CM/SW Contact:  Elliot Gault, LCSW Phone Number: 10/17/2023, 11:17 AM   Clinical Narrative:     Insurance auth received. Kerri at [facility] states they can admit pt today.  RN to call report. DC clinical sent electronically.   Pt's son aware and in agreement. No other TOC needs for dc.  Final next level of care: Skilled Nursing Facility Barriers to Discharge: Barriers Resolved   Patient Goals and CMS Choice Patient states their goals for this hospitalization and ongoing recovery are:: get stronger CMS Medicare.gov Compare Post Acute Care list provided to:: Patient Choice offered to / list presented to : Patient, Adult Children [place] ownership interest in [facility].provided to:: Patient    Discharge Placement              Patient chooses bed at: [facility] Patient to be transferred to facility by: w/c Name of family member notified: Casimiro Needle Patient and family notified of of transfer: 10/17/23  Discharge Plan and Services Additional resources added to the After Visit Summary for   In-house Referral: Clinical Social Work Discharge Planning Services: CM Consult Post Acute Care Choice: Skilled Nursing Facility                               Social Drivers of Health (SDOH) Interventions SDOH Screenings   Food Insecurity: No Food Insecurity (10/08/2023)  Housing: Low Risk  (10/08/2023)  Transportation Needs: Unmet Transportation Needs (10/08/2023)  Utilities: Not At Risk (10/08/2023)  Alcohol Screen: Low Risk  (11/20/2022)  Depression (PHQ2-9): Low Risk  (10/03/2023)  Financial Resource Strain: Low Risk  (10/02/2023)  Physical Activity: Inactive (10/02/2023)  Social Connections: Moderately Integrated (10/08/2023)  Stress: No Stress Concern Present (10/02/2023)  Tobacco Use: Low Risk   (10/11/2023)     Readmission Risk Interventions    10/16/2023    1:59 PM  Readmission Risk Prevention Plan  Medication Screening Complete  Transportation Screening Complete

## 2023-10-17 NOTE — Progress Notes (Signed)
 Multiple attempts to call pt report for discharge to [facility] unsuccessful. Case Management and Charge nurse updated.

## 2023-10-17 NOTE — Progress Notes (Signed)
 Pt discharged via [facility] to [facility] with all personal belongings in her possession.

## 2023-10-18 ENCOUNTER — Encounter: Payer: Self-pay | Admitting: Internal Medicine

## 2023-10-18 ENCOUNTER — Non-Acute Institutional Stay (SKILLED_NURSING_FACILITY): Payer: Self-pay | Admitting: Internal Medicine

## 2023-10-18 DIAGNOSIS — R29818 Other symptoms and signs involving the nervous system: Secondary | ICD-10-CM

## 2023-10-18 DIAGNOSIS — R4189 Other symptoms and signs involving cognitive functions and awareness: Secondary | ICD-10-CM

## 2023-10-18 DIAGNOSIS — C182 Malignant neoplasm of ascending colon: Secondary | ICD-10-CM | POA: Diagnosis not present

## 2023-10-18 DIAGNOSIS — E441 Mild protein-calorie malnutrition: Secondary | ICD-10-CM | POA: Diagnosis not present

## 2023-10-18 DIAGNOSIS — E039 Hypothyroidism, unspecified: Secondary | ICD-10-CM

## 2023-10-18 NOTE — Progress Notes (Signed)
 NURSING HOME LOCATION:  [facility] ROOM NUMBER:  [id number]  CODE STATUS: Full Code   PCP:  Debera Lat PA-C  This is a comprehensive admission note to this SNFperformed on this date less than 30 days from date of admission. Included are preadmission medical/surgical history; reconciled medication list; family history; social history and comprehensive review of systems.  Corrections and additions to the records were documented. Comprehensive physical exam was also performed. Additionally a clinical summary was entered for each active diagnosis pertinent to this admission in the Problem List to enhance continuity of care.  HPI: She was hospitalized 3/18 - 10/17/2023 with symptomatic anemia presenting with generalized weakness and lethargy.  She was referred to the ED because of severe anemia with H/H of 6.4/ 22.7 by her PCP. Iron level was 13 with saturation of 4%.  FOB was positive.  She did receive 2 units of packed red cells. Colonoscopy was performed on 3/19; biopsies revealed moderately differentiated adenocarcinoma with mucinous features. Robotic assisted laparoscopic right hemicolectomy was completed 3/21.  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  The tumor measured 5.8 x 3.4 x 0.8 cm the tumor had risen within a tubular adenoma with high-grade dysplasia.  38 pericolic lymph nodes were negative for carcinoma. Final H/H was 8.9/30.3.  Indices remain hypochromic, microcytic. On 3/18 TSH was therapeutic at 2.500. Albumin was 2.1  total protein 6.2. Oncology outpatient follow-up was to be 1 week postdischarge with Dr. Ellin Saba.  Past medical and surgical history: Includes essential hypertension, hypothyroidism, dyslipidemia,& neurocognitive deficit. Surgeries and procedures include breast excisional biopsy.  Family history: reviewed, non contributory due to advanced age.  Social  history: Non-smoker; currently nondrinker.   Review of systems: Clinical neurocognitive deficits made validity of responses questionable , compromising ROS completion.  She states that she was referred to the ED after blood work done at her routine annual exam was abnormal.  When I asked what was found at this hospitalization her responses were somewhat rambling.  She began to talk about polyps in 2004 "but two left in there and came back."  She does not realize she is scheduled to see the Oncologist in follow-up.  Her plan is to "have therapy here and then go home." When I specifically asked about a follow-up with Dr. Ellin Saba she stated "do not guess so." She denies any abdominal pain or GI symptoms or any bleeding dyscrasias.  Constitutional: No fever, significant weight change, fatigue  Eyes: No redness, discharge, pain, vision change ENT/mouth: No nasal congestion, purulent discharge, earache, change in hearing, sore throat  Cardiovascular: No chest pain, palpitations, paroxysmal nocturnal dyspnea, claudication, edema  Respiratory: No cough, sputum production, hemoptysis, DOE, significant snoring, apnea Gastrointestinal: No heartburn, dysphagia, abdominal pain, nausea /vomiting, rectal bleeding, melena, change in bowels Genitourinary: No dysuria, hematuria, pyuria, incontinence, nocturia Musculoskeletal: No joint stiffness, joint swelling, weakness, pain Dermatologic: No rash, pruritus, change in appearance of skin Neurologic: No dizziness, headache, syncope, seizures, numbness, tingling Psychiatric: No significant anxiety, depression, insomnia, anorexia Endocrine: No change in hair/skin/nails, excessive thirst, excessive hunger, excessive urination  Hematologic/lymphatic: No significant bruising, lymphadenopathy, abnormal bleeding Allergy/immunology: No itchy/watery eyes, significant sneezing, urticaria, angioedema  Physical exam:  Pertinent or positive findings: She appears her age and  somewhat chronically ill.  Facies are weathered.  Initially she was asleep in the right lateral decubitus position exhibiting hypopnea without frank apnea or snoring.  Eyebrows are absent.  She has an upper plate.  There is minimal hirsutism over the chin.  There are multiple staples over the abdomen without evidence of any associated cellulitis.  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  General appearance: no acute distress, increased work of breathing is present.   Lymphatic: No lymphadenopathy about the head, neck, axilla. Eyes: No conjunctival inflammation or lid edema is present. There is no scleral icterus. Ears:  External ear exam shows no significant lesions or deformities.   Nose:  External nasal examination shows no deformity or inflammation. Nasal mucosa are pink and moist without lesions, exudates Oral exam: Lips and gums are healthy appearing.There is no oropharyngeal erythema or exudate. Neck:  No thyromegaly, masses, tenderness noted.    Heart:  Normal rate and regular rhythm. S1 and S2 normal without gallop, murmur, click, rub.  Lungs: Chest clear to auscultation without wheezes, rhonchi, rales, rubs. Abdomen: Bowel sounds are normal.  Abdomen is soft and nontender with no organomegaly, hernias, masses. GU: Deferred  Extremities:  No cyanosis, clubbing, edema. Neurologic exam:  Balance, Rhomberg, finger to nose testing could not be completed due to clinical state Skin: Warm & dry w/o tenting. No significant lesions or rash.  See clinical summary under each active problem in the Problem List with associated updated therapeutic plan

## 2023-10-18 NOTE — Patient Instructions (Signed)
 See assessment and plan under each diagnosis in the problem list and acutely for this visit

## 2023-10-18 NOTE — Assessment & Plan Note (Signed)
 On exam eyebrows are absent.  TSH 10/08/2023 was therapeutic at 2.500.

## 2023-10-18 NOTE — Assessment & Plan Note (Addendum)
 Current H/H 8.9/30.3; on iron supplement. Oncology outpatient follow-up with Dr. Ellin Saba has been recommended.  The patient seems unaware of this.  She assumes she is to have therapy at the SNF and then return home.

## 2023-10-18 NOTE — Assessment & Plan Note (Addendum)
 She seems somewhat oblivious to how severe her anemia was; she stated that she went to the hospital only after her PCP found the severe anemia on routine annual blood work.  She has a vague understanding of diagnoses and recommendations made.  For instance she has no idea that she supposed to follow-up with Oncology.  She believes she is to have "therapy" here at the SNF and then return home. Epic does not include any CNS imaging to help assess any evidence of vascular dementia. BCAT MMSE will be performed at the SNF.

## 2023-10-19 DIAGNOSIS — E46 Unspecified protein-calorie malnutrition: Secondary | ICD-10-CM | POA: Insufficient documentation

## 2023-10-19 NOTE — Assessment & Plan Note (Signed)
 Albumin 2.1 & total protein 6.2. Protein supplement @ [facility].

## 2023-10-20 NOTE — Progress Notes (Signed)
[facility] [address], [place] [address]   Clinic Day:  10/21/2023  Referring physician: Debera Lat, PA-C  Patient Care Team: Cherlynn Polo as PCP - General (Physician Assistant) Ward, Elenora Fender, MD as Referring Physician (Obstetrics and Gynecology)   ASSESSMENT & PLAN:   Assessment:  1.  Stage II (T3 N0 M0) ascending colon adenocarcinoma: - Presentation: Severe anemia with hemoglobin 6.4, s/p 2 units PRBC - Colonoscopy (10/09/2023): In the proximal ascending colon, there is a spiral shaped semilunar/semiapple core neoplasm approximately 10 to 15 cm distal to the ileocecal valve.  Diverticulosis in the sigmoid colon.  Nonbleeding internal hemorrhoids.  Biopsy consistent with invasive moderately differentiated adenocarcinoma. - CTAP (10/10/2023): Focal lobular mass along the right side of the colon just above the ileocecal bowel.  Few borderline enlarged lymph nodes are seen in the medially adjacent mesentery in the right lower quadrant.  Tiny segment 8 dome liver lesion identified too small to characterize. - Right hemicolectomy on 10/11/2023 by Dr. Henreitta Leber - Pathology: Invasive moderately differentiated mucinous adenocarcinoma, invading through muscularis propria, tumor arises within a tubular adenoma with high-grade dysplasia, margins free, 0/38 lymph nodes involved, pT3 pN0.  Loss of nuclear expression of MLH1 and PMS2.  2.  Social/family history: - She is currently undergoing rehab at [facility].  She used to live by herself prior to surgery and is independent of ADLs and IADLs.  Her son Kathlene November (lives in [place]) is with her today.  Non-smoker. - Son had melanoma.  Brother had colon cancer in his 57s.   Plan:  1.  Stage II (T3 N0 M0) ascending colon adenocarcinoma: - We reviewed imaging, colonoscopy, pathology reports in detail.  She reportedly lost about 30 pounds in the last 1 year due to decreased appetite. - Recommend CT chest without contrast  to complete workup. - Recommend MRI of the liver with and without contrast to evaluate segment 8 dome lesion. - Recommend BRAF mutation testing and MLH1 hyper methylation testing. - Will also send for Signatera testing.  Will obtain baseline CEA level today. - RTC 3 weeks for follow-up.  2.  Severe microcytic anemia: - Will check ferritin, iron panel, B12, folic acid, MMA. - If iron level is low, will consider parenteral iron therapy.   No orders of the defined types were placed in this encounter.     I,Katie Daubenspeck,acting as a Neurosurgeon for Doreatha Massed, MD.,have documented all relevant documentation on the behalf of Doreatha Massed, MD,as directed by  Doreatha Massed, MD while in the presence of Doreatha Massed, MD.   I, Doreatha Massed MD, have reviewed the above documentation for accuracy and completeness, and I agree with the above.   Doreatha Massed, MD   3/31/20258:39 AM  CHIEF COMPLAINT/PURPOSE OF CONSULT:   Diagnosis: ascending colon cancer   Cancer Staging  Colon cancer, ascending [facility]) Staging form: Colon and Rectum, AJCC 8th Edition - Clinical stage from 10/21/2023: Stage IIA (cT3, cN0, cM0) - Signed by Doreatha Massed, MD on 10/21/2023    Prior Therapy: right hemicolectomy, 10/11/23  Current Therapy: Under workup   HISTORY OF PRESENT ILLNESS:   Oncology History  Colon cancer, ascending (HCC)  10/10/2023 Initial Diagnosis   Colon cancer, ascending (HCC)   10/21/2023 Cancer Staging   Staging form: Colon and Rectum, AJCC 8th Edition - Clinical stage from 10/21/2023: Stage IIA (cT3, cN0, cM0) - Signed by Doreatha Massed, MD on 10/21/2023 Histopathologic type: Adenocarcinoma, NOS Stage prefix: Initial diagnosis Total positive  nodes: 0 Total nodes examined: 38 Histologic grade (G): G2 Histologic grading system: 4 grade system       Stacey Fitzgerald is a 83 y.o. female presenting to clinic today for evaluation of ascending colon  cancer at the request of Drs. Rourk/Bridges.  She was referred to the ED on 10/08/23 for low hemoglobin of 6.7 on 10/04/23. A repeat CBC in the ED showed a slight drop in hemoglobin to 6.4. Additionally, hemoccult test was positive. She was given 2u pRBC and evaluated by GI. She proceeded to colonoscopy and EGD on 10/09/23 under Dr. Jena Gauss. Colonoscopy revealed a spiral-shaped neoplasm approximately 10-15 cm distal to ileocecal valve. There was evidence of an ink mark at the level of this tumor. Of note, a prior colonoscopy, performed on 11/23/19 by Dr. Norma Fredrickson, found a tubular adenoma that was incompletely resected, and the area had been tattooed. Biopsy obtained from the area confirmed invasive moderately differentiated adenocarcinoma with mucinous features.   She was evaluated by Dr. Henreitta Leber and underwent staging CT A/P on 10/10/23 showing: focal lobular mass along right side of colon near and just above ileocecal valve; a few borderline-enlarged lymph nodes seen in medially adjacent mesentery in RLQ.   She was taken for right colectomy on 10/11/23 and final pathology showed: 5.8 cm invasive moderately differentiated mucinous adenocarcinoma invading through muscularis propria, arising within a tubular adenoma with high-grade dysplasia; margins and all 38 lymph nodes were negative.  Today, she states that she is doing well overall. Her appetite level is at 60%. Her energy level is at 40%.  PAST MEDICAL HISTORY:   Past Medical History: Past Medical History:  Diagnosis Date   Arthritis    Female cystocele    Hyperlipidemia    Hypertension    Hypothyroidism    Menopausal state    Obesity, unspecified    Pessary maintenance    Rectocele     Surgical History: Past Surgical History:  Procedure Laterality Date   BREAST EXCISIONAL BIOPSY Left 1972   benign   BREAST SURGERY     benign cyst   COLONOSCOPY     COLONOSCOPY N/A 10/09/2023   Procedure: COLONOSCOPY;  Surgeon: Corbin Ade, MD;   [facility];  Service: Endoscopy;  Laterality: N/A;   COLONOSCOPY WITH PROPOFOL N/A 11/23/2019   Procedure: COLONOSCOPY WITH PROPOFOL;  Surgeon: Toledo, Boykin Nearing, MD;  Location: [facility];  Service: Gastroenterology;  Laterality: N/A;   ESOPHAGOGASTRODUODENOSCOPY N/A 10/09/2023   Procedure: EGD (ESOPHAGOGASTRODUODENOSCOPY);  Surgeon: Corbin Ade, MD;  Location: [facility];  Service: Endoscopy;  Laterality: N/A;   miscarrage     TUBAL LIGATION     uterine pessary      Social History: Social History   Socioeconomic History   Marital status: Married    Spouse name: Not on file   Number of children: 2   Years of education: Not on file   Highest education level: 12th grade  Occupational History   Occupation: retired  Tobacco Use   Smoking status: Never   Smokeless tobacco: Never  Vaping Use   Vaping status: Never Used  Substance and Sexual Activity   Alcohol use: Not Currently    Alcohol/week: 0.0 standard drinks of alcohol   Drug use: No   Sexual activity: Not on file  Other Topics Concern   Not on file  Social History Narrative   Not on file   Social Drivers of Health   Financial Resource Strain: Low Risk  (10/02/2023)  Overall Financial Resource Strain (CARDIA)    Difficulty of Paying Living Expenses: Not hard at all  Food Insecurity: No Food Insecurity (10/08/2023)   Hunger Vital Sign    Worried About Running Out of Food in the Last Year: Never true    Ran Out of Food in the Last Year: Never true  Transportation Needs: Unmet Transportation Needs (10/08/2023)   PRAPARE - Transportation    Lack of Transportation (Medical): No    Lack of Transportation (Non-Medical): Yes  Physical Activity: Inactive (10/02/2023)   Exercise Vital Sign    Days of Exercise per Week: 0 days    Minutes of Exercise per Session: 0 min  Stress: No Stress Concern Present (10/02/2023)   [employer] of Occupational Health - Occupational Stress Questionnaire    Feeling  of Stress : Only a little  Social Connections: Moderately Integrated (10/08/2023)   Social Connection and Isolation Panel [NHANES]    Frequency of Communication with Friends and Family: More than three times a week    Frequency of Social Gatherings with Friends and Family: Once a week    Attends Religious Services: More than 4 times per year    Active Member of [employer] or Organizations: No    Attends Engineer, structural: More than 4 times per year    Marital Status: Widowed  Intimate Partner Violence: Not At Risk (10/08/2023)   Humiliation, Afraid, Rape, and Kick questionnaire    Fear of Current or Ex-Partner: No    Emotionally Abused: No    Physically Abused: No    Sexually Abused: No    Family History: Family History  Problem Relation Age of Onset   Diabetes Mother    Seizures Mother    CVA Father    Dementia Brother    Diabetes Maternal Grandfather    Mental illness Sister    Breast cancer Neg Hx     Current Medications:  Current Outpatient Medications:    acetaminophen (TYLENOL) 500 MG tablet, Take 500 mg by mouth every 6 (six) hours as needed., Disp: , Rfl:    docusate sodium (COLACE) 100 MG capsule, Take 1 capsule (100 mg total) by mouth 2 (two) times daily., Disp: 10 capsule, Rfl: 0   ferrous sulfate 325 (65 FE) MG tablet, Take 1 tablet (325 mg total) by mouth 2 (two) times daily with a meal., Disp: 30 tablet, Rfl: 3   lactobacillus acidophilus & bulgar (LACTINEX) chewable tablet, Chew 1 tablet by mouth 3 (three) times daily with meals for 10 days., Disp: 30 tablet, Rfl: 0   lisinopril (ZESTRIL) 20 MG tablet, Take 1 tablet (20 mg total) by mouth 2 (two) times daily., Disp: 180 tablet, Rfl: 1   ondansetron (ZOFRAN) 4 MG tablet, Take 1 tablet (4 mg total) by mouth every 6 (six) hours as needed for nausea., Disp: 20 tablet, Rfl: 0   oxyCODONE (OXY IR/ROXICODONE) 5 MG immediate release tablet, Take 1 tablet (5 mg total) by mouth every 4 (four) hours as needed for up to  5 days for moderate pain (pain score 4-6)., Disp: 30 tablet, Rfl: 0   pantoprazole (PROTONIX) 40 MG tablet, Take 1 tablet (40 mg total) by mouth 2 (two) times daily., Disp: 60 tablet, Rfl: 0   simvastatin (ZOCOR) 20 MG tablet, Take 1 tablet (20 mg total) by mouth at bedtime., Disp: 90 tablet, Rfl: 3   traZODone (DESYREL) 50 MG tablet, Take 0.5 tablets (25 mg total) by mouth at bedtime as needed for up to  5 days for sleep., Disp: 5 tablet, Rfl: 0   Allergies: Allergies  Allergen Reactions   Cephalexin Hives    Patient reports that she is not allergic to this medication but she has not taken it since it was listed as an allergy    REVIEW OF SYSTEMS:   Review of Systems  Constitutional:  Negative for chills, fatigue and fever.  HENT:   Negative for lump/mass, mouth sores, nosebleeds, sore throat and trouble swallowing.   Eyes:  Negative for eye problems.  Respiratory:  Negative for cough and shortness of breath.   Cardiovascular:  Negative for chest pain, leg swelling and palpitations.  Gastrointestinal:  Positive for diarrhea. Negative for abdominal pain, constipation, nausea and vomiting.  Genitourinary:  Negative for bladder incontinence, difficulty urinating, dysuria, frequency, hematuria and nocturia.   Musculoskeletal:  Negative for arthralgias, back pain, flank pain, myalgias and neck pain.  Skin:  Negative for itching and rash.  Neurological:  Negative for dizziness, headaches and numbness.  Hematological:  Does not bruise/bleed easily.  Psychiatric/Behavioral:  Negative for depression, sleep disturbance and suicidal ideas. The patient is not nervous/anxious.   All other systems reviewed and are negative.    VITALS:   Blood pressure (!) 160/72, pulse 94, temperature 98.1 F (36.7 C), temperature source Oral, resp. rate 18, height 5\' 7"  (1.702 m), weight 141 lb 12.1 oz (64.3 kg), SpO2 100%.  Wt Readings from Last 3 Encounters:  10/21/23 141 lb 12.1 oz (64.3 kg)  10/18/23 143  lb (64.9 kg)  10/17/23 141 lb 15.6 oz (64.4 kg)    Body mass index is 22.2 kg/m.  Performance status (ECOG): 1 - Symptomatic but completely ambulatory  PHYSICAL EXAM:   Physical Exam Vitals and nursing note reviewed. Exam conducted with a chaperone present.  Constitutional:      Appearance: Normal appearance.  Cardiovascular:     Rate and Rhythm: Normal rate and regular rhythm.     Pulses: Normal pulses.     Heart sounds: Normal heart sounds.  Pulmonary:     Effort: Pulmonary effort is normal.     Breath sounds: Normal breath sounds.  Abdominal:     Palpations: Abdomen is soft. There is no hepatomegaly, splenomegaly or mass.     Tenderness: There is no abdominal tenderness.  Musculoskeletal:     Right lower leg: No edema.     Left lower leg: No edema.  Lymphadenopathy:     Cervical: No cervical adenopathy.     Right cervical: No superficial, deep or posterior cervical adenopathy.    Left cervical: No superficial, deep or posterior cervical adenopathy.     Upper Body:     Right upper body: No supraclavicular or axillary adenopathy.     Left upper body: No supraclavicular or axillary adenopathy.  Neurological:     General: No focal deficit present.     Mental Status: She is alert and oriented to person, place, and time.  Psychiatric:        Mood and Affect: Mood normal.        Behavior: Behavior normal.  Sutures at the surgical site present.  LABS:   CBC    Component Value Date/Time   WBC 11.7 (H) 10/15/2023 0442   RBC 4.06 10/15/2023 0442   HGB 8.9 (L) 10/15/2023 0442   HGB 6.7 (LL) 10/04/2023 0835   HCT 30.3 (L) 10/15/2023 0442   HCT 23.5 (L) 10/04/2023 0835   PLT 354 10/15/2023 0442   PLT 371 10/04/2023  0835   MCV 74.6 (L) 10/15/2023 0442   MCV 69 (L) 10/04/2023 0835   MCH 21.9 (L) 10/15/2023 0442   MCHC 29.4 (L) 10/15/2023 0442   RDW 22.2 (H) 10/15/2023 0442   RDW 14.7 10/04/2023 0835   LYMPHSABS 1.3 10/13/2023 0421   LYMPHSABS 1.9 10/04/2023 0835    MONOABS 0.9 10/13/2023 0421   EOSABS 0.1 10/13/2023 0421   EOSABS 0.2 10/04/2023 0835   BASOSABS 0.1 10/13/2023 0421   BASOSABS 0.1 10/04/2023 0835    CMP    Component Value Date/Time   NA 138 10/15/2023 0442   NA 140 10/04/2023 0835   K 3.7 10/15/2023 0442   CL 106 10/15/2023 0442   CO2 23 10/15/2023 0442   GLUCOSE 99 10/15/2023 0442   BUN 16 10/15/2023 0442   BUN 23 10/04/2023 0835   CREATININE 0.83 10/15/2023 0442   CREATININE 0.87 05/31/2017 0814   CALCIUM 8.9 10/15/2023 0442   PROT 6.2 (L) 10/11/2023 0447   PROT 6.4 10/04/2023 0835   ALBUMIN 3.1 (L) 10/11/2023 0447   ALBUMIN 4.2 10/04/2023 0835   AST 10 (L) 10/11/2023 0447   ALT 9 10/11/2023 0447   ALKPHOS 49 10/11/2023 0447   BILITOT 0.7 10/11/2023 0447   BILITOT 0.2 10/04/2023 0835   GFRNONAA >60 10/15/2023 0442   GFRNONAA 65 05/31/2017 0814   GFRAA 53 (L) 05/31/2020 0937   GFRAA 76 05/31/2017 0814     No results found for: "CEA1", "CEA" / No results found for: "CEA1", "CEA" No results found for: "PSA1" No results found for: "XNA355" No results found for: "CAN125"  No results found for: "TOTALPROTELP", "ALBUMINELP", "A1GS", "A2GS", "BETS", "BETA2SER", "GAMS", "MSPIKE", "SPEI" Lab Results  Component Value Date   TIBC 368 10/08/2023   TIBC 349 10/04/2023   FERRITIN 4 (L) 10/08/2023   IRONPCTSAT 2 (L) 10/08/2023   IRONPCTSAT 4 (LL) 10/04/2023   No results found for: "LDH"   STUDIES:   DG CHEST PORT 1 VIEW Result Date: 10/10/2023 CLINICAL DATA:  Fever EXAM: PORTABLE CHEST 1 VIEW COMPARISON:  10/10/2023 FINDINGS: Single frontal view of the chest demonstrates an unremarkable cardiac silhouette. No airspace disease, effusion, or pneumothorax. Stable hiatal hernia. IMPRESSION: 1. No acute intrathoracic process. 2. Hiatal hernia. Electronically Signed   By: Sharlet Salina M.D.   On: 10/10/2023 19:58   CT ABDOMEN PELVIS W CONTRAST Result Date: 10/10/2023 CLINICAL DATA:  Colon cancer staging.  * Tracking Code:  BO * EXAM: CT ABDOMEN AND PELVIS WITH CONTRAST TECHNIQUE: Multidetector CT imaging of the abdomen and pelvis was performed using the standard protocol following bolus administration of intravenous contrast. RADIATION DOSE REDUCTION: This exam was performed according to the departmental dose-optimization program which includes automated exposure control, adjustment of the mA and/or kV according to patient size and/or use of iterative reconstruction technique. CONTRAST:  OMNIPAQUE IOHEXOL 300 MG/ML  SOLN COMPARISON:  None Available. FINDINGS: Lower chest: Motion seen along the lung bases with some basilar atelectasis. There is a large hiatal hernia identified. Tiny left pleural effusion. Of note the extreme superior aspect of the diaphragm on the right is not included in the imaging field. This includes portions of the liver limiting evaluation. Hepatobiliary: Small low-density area along the margin of the falciform ligament in segment 4, favoring focal fat deposition. There is a a very small low-density focus in segment 8 of the liver on series 2, image 6 measuring 5 mm, too small to completely characterize. Of note the dome of the liver  is not include the imaging field limiting evaluation for metastasis in this location. Gallbladder is nondilated. Patent portal vein. Pancreas: Unremarkable. No pancreatic ductal dilatation or surrounding inflammatory changes. Spleen: Spleen is nonenlarged. There is a small cystic area in the spleen anteriorly measuring 6 mm series 2, image 12. Too small to completely characterize. Similar focus more caudal in the spleen on image 19 measuring 6 mm as well. Dome focus as well on image 5. Adrenals/Urinary Tract: Slight thickening of the adrenal glands. Numerous bilateral parapelvic renal cysts identified, left greater than right. Some parenchymal cyst is also identified. No separate enhancing renal mass. Mild bilateral renal atrophy. At the upper pole of the right kidney is a  small fat containing lesion consistent with an 8 mm angiomyolipoma, best seen on coronal series 5, image 75. The ureters have normal course and caliber extending down to the urinary bladder. Preserved contour to the urinary bladder. Stomach/Bowel: Nodular appearing mass identified along ascending colon just above the ileocecal bowel. Please correlate with location of primary colonic neoplasm as per history. Otherwise the large bowel has a normal course and caliber. Sigmoid colon diverticulosis. Oral contrast was administered. Stomach is underdistended. Large second portion duodenal diverticulum. The small bowel is nondilated. Vascular/Lymphatic: Moderate calcified plaque along the aorta and iliac vessels. Normal caliber aorta and IVC. No abnormal nodal enlargement identified in the retroperitoneum, pelvis. There is a prominent lymph node in the right lower quadrant mesentery seen on series 2, image 41 adjacent to the right side of the colon measuring 13 x 9 mm. Larger than usually seen and with the proximity to the potential colonic neoplasms could be Um metastatic node. Is also some larger nodes than usually seen more caudal in the mesentery as well such as series 2, image 48 measuring 11 by 9 mm. Additional nodal disease is possible. These areas are suspicious. Reproductive: Pessary device in place. There is a complex cystic lesion in the right adnexa which could be ovarian with a marginal dystrophic calcification. Overall the lesion in total measures 3.2 by 2.7 cm. Please correlate with any prior study or additional workup with ultrasound to further delineate when clinically appropriate. Other: No free air or free fluid identified. Musculoskeletal: Curvature of spine with advanced degenerative changes throughout the lumbar spine. More mild changes along the pelvis. Multilevel listhesis along spine lumbar spine with disc bulging and osteophytes. Multilevel significant stenosis seen. There is a sclerotic focus  along the right seventh rib at the edge of the imaging field. Favor bone island. IMPRESSION: Focal lobular mass along the right side of the colon near and just above the ileocecal bowel. Please correlate for location of neoplasm. A few borderline enlarged lymph nodes are seen in the medially adjacent mesentery in the right lower quadrant. These are worrisome lesions for spread of disease. Tiny segment 8 dome liver lesion identified, too small to characterize. Also similar tiny cystic foci in the spleen. Please correlate with any prior examination to assess stability or additional workup versus short follow-up. Note the dome of the liver is clipped off the edge of the film limiting evaluation for subtle lesion in those locations. There is also significant motion throughout the examination. Complex cystic lesion right adnexa could be ovarian with a cystic component and dystrophic calcification. Please correlate with any prior to assess stability or additional workup with ultrasound versus follow-up in 3-6 months. Large hiatal hernia. Left-sided colonic diverticula. Pessary. Electronically Signed   By: Karen Kays M.D.   On: 10/10/2023  16:02      

## 2023-10-21 ENCOUNTER — Inpatient Hospital Stay: Attending: Hematology | Admitting: Hematology

## 2023-10-21 ENCOUNTER — Inpatient Hospital Stay

## 2023-10-21 VITALS — BP 154/68 | HR 94 | Temp 98.1°F | Resp 18 | Ht 67.0 in | Wt 141.8 lb

## 2023-10-21 DIAGNOSIS — C182 Malignant neoplasm of ascending colon: Secondary | ICD-10-CM | POA: Insufficient documentation

## 2023-10-21 DIAGNOSIS — Z803 Family history of malignant neoplasm of breast: Secondary | ICD-10-CM | POA: Insufficient documentation

## 2023-10-21 DIAGNOSIS — Z79899 Other long term (current) drug therapy: Secondary | ICD-10-CM | POA: Insufficient documentation

## 2023-10-21 DIAGNOSIS — K769 Liver disease, unspecified: Secondary | ICD-10-CM

## 2023-10-21 DIAGNOSIS — D509 Iron deficiency anemia, unspecified: Secondary | ICD-10-CM | POA: Diagnosis not present

## 2023-10-21 DIAGNOSIS — Z800 Family history of malignant neoplasm of digestive organs: Secondary | ICD-10-CM | POA: Insufficient documentation

## 2023-10-21 LAB — CBC WITH DIFFERENTIAL/PLATELET
Abs Immature Granulocytes: 0.09 10*3/uL — ABNORMAL HIGH (ref 0.00–0.07)
Basophils Absolute: 0.1 10*3/uL (ref 0.0–0.1)
Basophils Relative: 1 %
Eosinophils Absolute: 0.2 10*3/uL (ref 0.0–0.5)
Eosinophils Relative: 1 %
HCT: 30.6 % — ABNORMAL LOW (ref 36.0–46.0)
Hemoglobin: 9 g/dL — ABNORMAL LOW (ref 12.0–15.0)
Immature Granulocytes: 1 %
Lymphocytes Relative: 9 %
Lymphs Abs: 1.3 10*3/uL (ref 0.7–4.0)
MCH: 22 pg — ABNORMAL LOW (ref 26.0–34.0)
MCHC: 29.4 g/dL — ABNORMAL LOW (ref 30.0–36.0)
MCV: 74.8 fL — ABNORMAL LOW (ref 80.0–100.0)
Monocytes Absolute: 1 10*3/uL (ref 0.1–1.0)
Monocytes Relative: 7 %
Neutro Abs: 11.8 10*3/uL — ABNORMAL HIGH (ref 1.7–7.7)
Neutrophils Relative %: 81 %
Platelets: 536 10*3/uL — ABNORMAL HIGH (ref 150–400)
RBC: 4.09 MIL/uL (ref 3.87–5.11)
RDW: 22.7 % — ABNORMAL HIGH (ref 11.5–15.5)
WBC: 14.5 10*3/uL — ABNORMAL HIGH (ref 4.0–10.5)
nRBC: 0 % (ref 0.0–0.2)

## 2023-10-21 LAB — IRON AND TIBC
Iron: 20 ug/dL — ABNORMAL LOW (ref 28–170)
Saturation Ratios: 8 % — ABNORMAL LOW (ref 10.4–31.8)
TIBC: 260 ug/dL (ref 250–450)
UIBC: 240 ug/dL

## 2023-10-21 LAB — HEPATIC FUNCTION PANEL
ALT: 8 U/L (ref 0–44)
AST: 7 U/L — ABNORMAL LOW (ref 15–41)
Albumin: 3.2 g/dL — ABNORMAL LOW (ref 3.5–5.0)
Alkaline Phosphatase: 52 U/L (ref 38–126)
Bilirubin, Direct: 0.1 mg/dL (ref 0.0–0.2)
Indirect Bilirubin: 0.7 mg/dL (ref 0.3–0.9)
Total Bilirubin: 0.8 mg/dL (ref 0.0–1.2)
Total Protein: 6.8 g/dL (ref 6.5–8.1)

## 2023-10-21 LAB — VITAMIN B12: Vitamin B-12: 340 pg/mL (ref 180–914)

## 2023-10-21 LAB — FERRITIN: Ferritin: 121 ng/mL (ref 11–307)

## 2023-10-21 LAB — FOLATE: Folate: 8 ng/mL (ref >5.9)

## 2023-10-21 NOTE — Patient Instructions (Signed)
 [facility]  Discharge Instructions  You were seen and examined today by Dr. Ellin Saba. Dr. Ellin Saba is a medical oncologist, meaning that he specializes in the treatment of cancer diagnoses. Dr. Ellin Saba discussed your past medical history, family history of cancers, and the events that led to you being here today.  You were referred to Dr. Ellin Saba due to a new diagnosis of colon cancer. It appears to be a Stage II Colon Cancer as all of the lymph nodes that were removed with surgery were negative.  Dr. Ellin Saba has recommended additional labs today for further evaluation, this will include iron labs and a test to see if there are any tumor cells floating in your blood stream.  Dr. Ellin Saba also recommends a CT Chest and MRI of the Liver to complete the work-up. There was a tiny spot noted on your liver that may very well be benign (not cancerous).  Follow-up with Dr. Ellin Saba as scheduled.  Thank you for choosing [facility] to provide your oncology and hematology care.   To afford each patient quality time with our provider, please arrive at least 15 minutes before your scheduled appointment time. You may need to reschedule your appointment if you arrive late (10 or more minutes). Arriving late affects you and other patients whose appointments are after yours.  Also, if you miss three or more appointments without notifying the office, you may be dismissed from the clinic at the provider's discretion.    Again, thank you for choosing [facility].  Our hope is that these requests will decrease the amount of time that you wait before being seen by our physicians.   If you have a lab appointment with the Cancer Center - please note that after April 8th, all labs will be drawn in the cancer center.  You do not have to check in or register with the main entrance as you have in the past but will complete your check-in at  the cancer center.            _____________________________________________________________  Should you have questions after your visit to [facility], please contact our office at [phone] and follow the prompts.  Our office hours are 8:00 a.m. to 4:30 p.m. Monday - Thursday and 8:00 a.m. to 2:30 p.m. Friday.  Please note that voicemails left after 4:00 p.m. may not be returned until the following business day.  We are closed weekends and all major holidays.  You do have access to a nurse 24-7, just call the main number to the clinic [phone] and do not press any options, hold on the line and a nurse will answer the phone.    For prescription refill requests, have your pharmacy contact our office and allow 72 hours.    Masks are no longer required in the cancer centers. If you would like for your care team to wear a mask while they are taking care of you, please let them know. You may have one support person who is at least 82 years old accompany you for your appointments.

## 2023-10-22 LAB — CEA: CEA: 1 ng/mL (ref 0.0–4.7)

## 2023-10-24 ENCOUNTER — Ambulatory Visit (INDEPENDENT_AMBULATORY_CARE_PROVIDER_SITE_OTHER): Admitting: General Surgery

## 2023-10-24 ENCOUNTER — Encounter: Payer: Self-pay | Admitting: General Surgery

## 2023-10-24 VITALS — BP 144/76 | HR 88 | Temp 97.5°F | Resp 14 | Ht 67.0 in | Wt 144.0 lb

## 2023-10-24 DIAGNOSIS — C182 Malignant neoplasm of ascending colon: Secondary | ICD-10-CM

## 2023-10-24 NOTE — Progress Notes (Signed)
 [facility]  Patient doing well. Having Bms and eating. She saw Dr. Ellin Saba and is getting more workup to determine next best steps.  Granddaughter with her today.  BP (!) 144/76   Pulse 88   Temp (!) 97.5 F (36.4 C) (Oral)   Resp 14   Ht 5\' 7"  (1.702 m)   Wt 144 lb (65.3 kg)   SpO2 96%   BMI 22.55 kg/m  Soft, nondistended, nontender Staples c/d/I with no erythema or drainage Staples removed, steri strips placed  Patient s/p robotic right hemicolectomy for colon cancer. Doing well. Stage II cancer.  Steristrips will peel off in the next 5-7 days. You can remove them once they are peeling off. It is ok to shower. Pat the area dry.  No heavy lifting > 10 lbs, excessive bending, pushing, pulling, or squatting for 6-8 weeks after surgery.  Diet as tolerated. Keep stools regular and soft.   Future Appointments  Date Time Provider Department Center  10/31/2023  9:30 AM AP-MR 1 AP-MRI [place] [facility]  11/01/2023  9:30 AM AP-CT 1 AP-CT [place] [facility]  11/18/2023  8:00 AM Doreatha Massed, MD [facility] None  11/25/2023  3:10 PM [facility]-ANNUAL WELLNESS VISIT [facility] PEC  12/05/2023  1:15 PM Lucretia Roers, MD RS-RS None    Algis Greenhouse, MD [facility] [address] Vella Raring [place], [place] [address] [phone] (office)

## 2023-10-24 NOTE — Patient Instructions (Signed)
Steristrips will peel off in the next 5-7 days. You can remove them once they are peeling off. It is ok to shower. Pat the area dry.  No heavy lifting > 10 lbs, excessive bending, pushing, pulling, or squatting for 6-8 weeks after surgery.  Diet as tolerated. Keep stools regular and soft.

## 2023-10-25 LAB — METHYLMALONIC ACID, SERUM: Methylmalonic Acid, Quantitative: 146 nmol/L (ref 0–378)

## 2023-10-28 ENCOUNTER — Other Ambulatory Visit: Payer: Self-pay | Admitting: Adult Health

## 2023-10-28 ENCOUNTER — Non-Acute Institutional Stay (SKILLED_NURSING_FACILITY): Admitting: Adult Health

## 2023-10-28 ENCOUNTER — Encounter: Payer: Self-pay | Admitting: Adult Health

## 2023-10-28 DIAGNOSIS — C182 Malignant neoplasm of ascending colon: Secondary | ICD-10-CM | POA: Diagnosis not present

## 2023-10-28 DIAGNOSIS — D649 Anemia, unspecified: Secondary | ICD-10-CM

## 2023-10-28 DIAGNOSIS — E441 Mild protein-calorie malnutrition: Secondary | ICD-10-CM | POA: Diagnosis not present

## 2023-10-28 DIAGNOSIS — I10 Essential (primary) hypertension: Secondary | ICD-10-CM

## 2023-10-28 DIAGNOSIS — R29818 Other symptoms and signs involving the nervous system: Secondary | ICD-10-CM

## 2023-10-28 DIAGNOSIS — R4189 Other symptoms and signs involving cognitive functions and awareness: Secondary | ICD-10-CM

## 2023-10-28 MED ORDER — LISINOPRIL 20 MG PO TABS: 20.0000 mg | ORAL_TABLET | Freq: Two times a day (BID) | ORAL | 0 refills | Status: DC

## 2023-10-28 MED ORDER — ONDANSETRON HCL 4 MG PO TABS: 4.0000 mg | ORAL_TABLET | Freq: Four times a day (QID) | ORAL | 0 refills | Status: DC | PRN

## 2023-10-28 MED ORDER — PANTOPRAZOLE SODIUM 40 MG PO TBEC: 40.0000 mg | DELAYED_RELEASE_TABLET | Freq: Two times a day (BID) | ORAL | 0 refills | Status: DC

## 2023-10-28 MED ORDER — SIMVASTATIN 20 MG PO TABS: 20.0000 mg | ORAL_TABLET | Freq: Every day | ORAL | 0 refills | Status: DC

## 2023-10-28 MED ORDER — FERROUS SULFATE 325 (65 FE) MG PO TABS: 325.0000 mg | ORAL_TABLET | Freq: Two times a day (BID) | ORAL | 0 refills | Status: DC

## 2023-10-28 NOTE — Progress Notes (Unsigned)
 Location:  [facility] Room Number: 136 Place of Service:  SNF (31)   CODE STATUS: full code   Allergies  Allergen Reactions   Cephalexin Hives    Patient reports that she is not allergic to this medication but she has not taken it since it was listed as an allergy    Chief Complaint  Patient presents with   Discharge Note    HPI:  She is being discharged to home with home health for pt/ot. She will need a rolling walker. She will need her prescriptions written and will need to follow up with her medical provider. She had been hospitalized for severe anemia requiring transfusion. Colonoscopy was performed on 3/19; biopsies revealed moderately differentiated adenocarcinoma with mucinous features. Robotic assisted laparoscopic right hemicolectomy was completed 3/21.  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  The tumor measured 5.8 x 3.4 x 0.8 cm the tumor had risen within a tubular adenoma with high-grade dysplasia.  38 pericolic lymph nodes were negative for carcinoma. She was admitted to this facility for short term rehab. Therapy: ambulate 100 feet with a rolling walker contact guard assist; upper body supervision; lower body min assist stand/pivot min assist; brp contact guard assist.   Past Medical History:  Diagnosis Date   Arthritis    Female cystocele    Hyperlipidemia    Hypertension    Hypothyroidism    Menopausal state    Obesity, unspecified    Pessary maintenance    Rectocele     Past Surgical History:  Procedure Laterality Date   BREAST EXCISIONAL BIOPSY Left 1972   benign   BREAST SURGERY     benign cyst   COLONOSCOPY     COLONOSCOPY N/A 10/09/2023   Procedure: COLONOSCOPY;  Surgeon: Corbin Ade, MD;  [facility];  Service: Endoscopy;  Laterality: N/A;   COLONOSCOPY WITH PROPOFOL N/A 11/23/2019   Procedure: COLONOSCOPY WITH PROPOFOL;   Surgeon: Toledo, Boykin Nearing, MD;  Location: [facility] ENDOSCOPY;  Service: Gastroenterology;  Laterality: N/A;   ESOPHAGOGASTRODUODENOSCOPY N/A 10/09/2023   Procedure: EGD (ESOPHAGOGASTRODUODENOSCOPY);  Surgeon: Corbin Ade, MD;  [facility];  Service: Endoscopy;  Laterality: N/A;   miscarrage     TUBAL LIGATION     uterine pessary      Social History   Socioeconomic History   Marital status: Married    Spouse name: Not on file   Number of children: 2   Years of education: Not on file   Highest education level: 12th grade  Occupational History   Occupation: retired  Tobacco Use   Smoking status: Never   Smokeless tobacco: Never  Vaping Use   Vaping status: Never Used  Substance and Sexual Activity   Alcohol use: Not Currently    Alcohol/week: 0.0 standard drinks of alcohol   Drug use: No   Sexual activity: Not on file  Other Topics Concern   Not on file  Social History Narrative   Not on file   Social Drivers of Health   Financial Resource Strain: Low Risk  (10/02/2023)   Overall Financial Resource Strain (CARDIA)    Difficulty of Paying Living Expenses: Not hard at all  Food Insecurity: No Food Insecurity (10/08/2023)   Hunger Vital Sign    Worried About Running Out of Food in the Last Year: Never true    Ran Out of Food in the Last Year: Never true  Transportation  Needs: Unmet Transportation Needs (10/08/2023)   PRAPARE - Transportation    Lack of Transportation (Medical): No    Lack of Transportation (Non-Medical): Yes  Physical Activity: Inactive (10/02/2023)   Exercise Vital Sign    Days of Exercise per Week: 0 days    Minutes of Exercise per Session: 0 min  Stress: No Stress Concern Present (10/02/2023)   [employer] of Occupational Health - Occupational Stress Questionnaire    Feeling of Stress : Only a little  Social Connections: Moderately Integrated (10/08/2023)   Social Connection and Isolation Panel [NHANES]    Frequency of Communication  with Friends and Family: More than three times a week    Frequency of Social Gatherings with Friends and Family: Once a week    Attends Religious Services: More than 4 times per year    Active Member of [employer] or Organizations: No    Attends Engineer, structural: More than 4 times per year    Marital Status: Widowed  Intimate Partner Violence: Not At Risk (10/08/2023)   Humiliation, Afraid, Rape, and Kick questionnaire    Fear of Current or Ex-Partner: No    Emotionally Abused: No    Physically Abused: No    Sexually Abused: No   Family History  Problem Relation Age of Onset   Diabetes Mother    Seizures Mother    CVA Father    Dementia Brother    Diabetes Maternal Grandfather    Mental illness Sister    Breast cancer Neg Hx       VITAL SIGNS BP 113/66   Pulse 66   Temp (!) 97.4 F (36.3 C)   Resp 18   Ht 5\' 8"  (1.727 m)   Wt 141 lb 12.8 oz (64.3 kg)   SpO2 98%   BMI 21.56 kg/m   Outpatient Encounter Medications as of 10/28/2023  Medication Sig   acetaminophen (TYLENOL) 500 MG tablet Take 500 mg by mouth every 6 (six) hours as needed.   docusate sodium (COLACE) 100 MG capsule Take 1 capsule (100 mg total) by mouth 2 (two) times daily.   ferrous sulfate 325 (65 FE) MG tablet Take 1 tablet (325 mg total) by mouth 2 (two) times daily with a meal.   lisinopril (ZESTRIL) 20 MG tablet Take 1 tablet (20 mg total) by mouth 2 (two) times daily.   ondansetron (ZOFRAN) 4 MG tablet Take 1 tablet (4 mg total) by mouth every 6 (six) hours as needed for nausea.   pantoprazole (PROTONIX) 40 MG tablet Take 1 tablet (40 mg total) by mouth 2 (two) times daily.   simvastatin (ZOCOR) 20 MG tablet Take 1 tablet (20 mg total) by mouth at bedtime.   No facility-administered encounter medications on file as of 10/28/2023.     SIGNIFICANT DIAGNOSTIC EXAMS  Review of Systems  Constitutional:  Negative for malaise/fatigue.  Respiratory:  Negative for cough and shortness of breath.    Cardiovascular:  Negative for chest pain, palpitations and leg swelling.  Gastrointestinal:  Negative for abdominal pain, constipation and heartburn.  Musculoskeletal:  Negative for back pain, joint pain and myalgias.  Skin: Negative.   Neurological:  Negative for dizziness.  Psychiatric/Behavioral:  The patient is not nervous/anxious.    Physical Exam Constitutional:      General: She is not in acute distress.    Appearance: She is well-developed. She is not diaphoretic.  Neck:     Thyroid: No thyromegaly.  Cardiovascular:     Rate and  Rhythm: Normal rate and regular rhythm.     Heart sounds: Normal heart sounds.  Pulmonary:     Effort: Pulmonary effort is normal. No respiratory distress.     Breath sounds: Normal breath sounds.  Abdominal:     General: Bowel sounds are normal. There is no distension.     Palpations: Abdomen is soft.     Tenderness: There is no abdominal tenderness.  Musculoskeletal:        General: Normal range of motion.     Cervical back: Neck supple.     Right lower leg: No edema.     Left lower leg: No edema.  Lymphadenopathy:     Cervical: No cervical adenopathy.  Skin:    General: Skin is warm and dry.  Neurological:     Mental Status: She is alert. Mental status is at baseline.  Psychiatric:        Mood and Affect: Mood normal.      ASSESSMENT/ PLAN:  Patient is being discharged with the following home health services: pt/ot to evaluate and treat as indicated for gait balance strength adl training.    Patient is being discharged with the following durable medical equipment: rolling walker    Patient has been advised to f/u with their PCP in 1-2 weeks to for a transitions of care visit.  Social services at their facility was responsible for arranging this appointment.  Pt was provided with adequate prescriptions of noncontrolled medications to reach the scheduled appointment .  For controlled substances, a limited supply was provided as  appropriate for the individual patient.  If the pt normally receives these medications from a pain clinic or has a contract with another physician, these medications should be received from that clinic or physician only).    A 30 day supply of her prescription medications have been sent to [facility].   Time spent with patient: 40 minutes: medications; dme; home health    Synthia Innocent NP [facility]  call [phone]

## 2023-10-29 LAB — IRON AND TIBC
Iron Saturation: 4 % — CL (ref 15–55)
Iron: 13 ug/dL — ABNORMAL LOW (ref 27–139)
Total Iron Binding Capacity: 349 ug/dL (ref 250–450)
UIBC: 336 ug/dL (ref 118–369)

## 2023-10-29 LAB — SPECIMEN STATUS REPORT

## 2023-10-31 ENCOUNTER — Ambulatory Visit (HOSPITAL_COMMUNITY)
Admission: RE | Admit: 2023-10-31 | Discharge: 2023-10-31 | Disposition: A | Discharge status: Home / Self Care | Source: Ambulatory Visit | Attending: Hematology | Admitting: Hematology

## 2023-10-31 DIAGNOSIS — K769 Liver disease, unspecified: Secondary | ICD-10-CM | POA: Insufficient documentation

## 2023-10-31 MED ORDER — GADOBUTROL 1 MMOL/ML IV SOLN
7.0000 mL | Freq: Once | INTRAVENOUS | Status: AC | PRN
  Administered 2023-10-31: 7 mL via INTRAVENOUS

## 2023-11-01 ENCOUNTER — Ambulatory Visit (HOSPITAL_COMMUNITY)
Admission: RE | Admit: 2023-11-01 | Discharge: 2023-11-01 | Disposition: A | Discharge status: Home / Self Care | Source: Ambulatory Visit | Attending: Hematology | Admitting: Hematology

## 2023-11-01 DIAGNOSIS — K769 Liver disease, unspecified: Secondary | ICD-10-CM | POA: Insufficient documentation

## 2023-11-01 DIAGNOSIS — C182 Malignant neoplasm of ascending colon: Secondary | ICD-10-CM | POA: Insufficient documentation

## 2023-11-04 ENCOUNTER — Inpatient Hospital Stay: Admitting: Physician Assistant

## 2023-11-08 ENCOUNTER — Encounter: Payer: Self-pay | Admitting: Physician Assistant

## 2023-11-08 ENCOUNTER — Ambulatory Visit (INDEPENDENT_AMBULATORY_CARE_PROVIDER_SITE_OTHER): Admitting: Physician Assistant

## 2023-11-08 VITALS — BP 128/57 | HR 60 | Resp 16 | Ht 67.0 in | Wt 139.4 lb

## 2023-11-08 DIAGNOSIS — R531 Weakness: Secondary | ICD-10-CM

## 2023-11-08 DIAGNOSIS — Z9049 Acquired absence of other specified parts of digestive tract: Secondary | ICD-10-CM

## 2023-11-08 DIAGNOSIS — F03A0 Unspecified dementia, mild, without behavioral disturbance, psychotic disturbance, mood disturbance, and anxiety: Secondary | ICD-10-CM

## 2023-11-08 DIAGNOSIS — R5381 Other malaise: Secondary | ICD-10-CM

## 2023-11-08 DIAGNOSIS — Z09 Encounter for follow-up examination after completed treatment for conditions other than malignant neoplasm: Secondary | ICD-10-CM | POA: Diagnosis not present

## 2023-11-08 DIAGNOSIS — I10 Essential (primary) hypertension: Secondary | ICD-10-CM

## 2023-11-08 NOTE — Progress Notes (Signed)
 Established patient visit  Patient: Stacey Fitzgerald   DOB: March 18, 1941   83 y.o. Female  MRN: [id number] Visit Date: 11/08/2023  Today's healthcare provider: Jolynn Spencer, PA-C   Chief Complaint  Patient presents with   Hospitalization Follow-up    F/u Hospital stay  sore  incision on lt side   Subjective     Discussed the use of AI scribe software for clinical note transcription with the patient, who gave verbal consent to proceed.  History of Present Illness The patient, with a recent history of colon tumor surgery, presents with soreness at the surgical site. The soreness has been present for a couple of days but is not constant and seems to be more pronounced when lying on the affected side. The patient denies any redness or irritation at the site. The patient's recovery has been steady, with improvement in strength and mobility, transitioning from a walker to a cane. The patient's appetite and taste are gradually returning, and she is consuming a variety of foods, albeit in small quantities. The patient has regular bowel movements and urination without any reported issues. The patient is also on iron  pills twice daily for anemia, which has resulted in dark stools. However, the patient has not required any stool softeners as the stools have been soft. The patient denies any nausea or acid reflux.      10/03/2023    2:01 PM 11/29/2022    9:41 AM 11/20/2022    3:35 PM  Depression screen PHQ 2/9  Decreased Interest 0 0 0  Down, Depressed, Hopeless 0 0 1  PHQ - 2 Score 0 0 1  Altered sleeping 0 0 0  Tired, decreased energy 0 0 0  Change in appetite 0 0 0  Feeling bad or failure about yourself  0 0 0  Trouble concentrating 0 0 0  Moving slowly or fidgety/restless 0 0 0  Suicidal thoughts 0 0 0  PHQ-9 Score 0 0 1  Difficult doing work/chores  Not difficult at all Not difficult at all      10/03/2023    2:02 PM  GAD 7 : Generalized Anxiety Score  Nervous, Anxious, on Edge 0   Control/stop worrying 0  Worry too much - different things 0  Trouble relaxing 0  Restless 0  Easily annoyed or irritable 0  Afraid - awful might happen 0  Total GAD 7 Score 0    Medications: Outpatient Medications Prior to Visit  Medication Sig   acetaminophen  (TYLENOL ) 500 MG tablet Take 500 mg by mouth every 6 (six) hours as needed.   docusate sodium  (COLACE) 100 MG capsule Take 1 capsule (100 mg total) by mouth 2 (two) times daily.   ferrous sulfate  325 (65 FE) MG tablet Take 1 tablet (325 mg total) by mouth 2 (two) times daily with a meal.   lisinopril  (ZESTRIL ) 20 MG tablet Take 1 tablet (20 mg total) by mouth 2 (two) times daily.   ondansetron  (ZOFRAN ) 4 MG tablet Take 1 tablet (4 mg total) by mouth every 6 (six) hours as needed for nausea. (Patient not taking: Reported on 11/08/2023)   pantoprazole  (PROTONIX ) 40 MG tablet Take 1 tablet (40 mg total) by mouth 2 (two) times daily. (Patient not taking: Reported on 11/08/2023)   simvastatin  (ZOCOR ) 20 MG tablet Take 1 tablet (20 mg total) by mouth at bedtime.   No facility-administered medications prior to visit.    Review of Systems All negative Except see HPI  Objective    There were no vitals taken for this visit.    Physical Exam Vitals reviewed.  Constitutional:      General: She is not in acute distress.    Appearance: Normal appearance. She is well-developed. She is not diaphoretic.  HENT:     Head: Normocephalic and atraumatic.  Eyes:     General: No scleral icterus.    Conjunctiva/sclera: Conjunctivae normal.  Neck:     Thyroid : No thyromegaly.  Cardiovascular:     Rate and Rhythm: Normal rate and regular rhythm.     Pulses: Normal pulses.     Heart sounds: Normal heart sounds. No murmur heard. Pulmonary:     Effort: Pulmonary effort is normal. No respiratory distress.     Breath sounds: Normal breath sounds. No wheezing, rhonchi or rales.  Musculoskeletal:     Cervical back: Neck supple.      Right lower leg: No edema.     Left lower leg: No edema.  Lymphadenopathy:     Cervical: No cervical adenopathy.  Skin:    General: Skin is warm and dry.     Findings: No rash.  Neurological:     Mental Status: She is alert and oriented to person, place, and time. Mental status is at baseline.  Psychiatric:        Mood and Affect: Mood normal.        Behavior: Behavior normal.      No results found for any visits on 11/08/23.      Assessment & Plan Hospital discharge follow-up/SNF Colorectal Cancer, ascending S/P robotic assisted laparoscopic right hemicolectomy 3/21 Recent surgery for bleeding colon tumor. No lymph node involvement or metastasis. - Follow up with oncology on May 7th for further evaluation and management. Postoperative Pain Soreness at surgical site, normal healing with intermittent pain and itching.  Iron  Deficiency Anemia Improved Secondary to chronic blood loss Taking iron  supplements. Dark stool due to iron . Hemoglobin low from bleeding tumor, now addressed. - Continue iron  supplementation twice daily until further evaluation by hematology. - FOLLOW-UP with oncology appointment to assess iron  levels and determine the need for continued supplementation.  Hypertension Chronic Blood pressure well-controlled with medication. - Continue monitoring blood pressure twice daily. - Ensure adherence to antihypertensive medication regimen/lisinopril  40mg . Will follow-up  Nutritional Concerns Difficulty maintaining a healthy diet. Appetite and taste improving. - Continue taking Centrum for Women 50+ multivitamin. - Encourage a balanced diet with adequate intake of fruits, vegetables, and proteins.  Mild dementia without behavioral disturbance, psychotic disturbance, mood disturbance, or anxiety, unspecified dementia type (HCC) Continue to monitor  Physical debility Weakness generalized improved Continue PT HH for weakness, balance problems. Ambulates with  walker/cane  No orders of the defined types were placed in this encounter.   No follow-ups on file.   The patient was advised to call back or seek an in-person evaluation if the symptoms worsen or if the condition fails to improve as anticipated.  I discussed the assessment and treatment plan with the patient. The patient was provided an opportunity to ask questions and all were answered. The patient agreed with the plan and demonstrated an understanding of the instructions.  I, Paizley Ramella, PA-C have reviewed all documentation for this visit. The documentation on 11/08/2023  for the exam, diagnosis, procedures, and orders are all accurate and complete.  Jolynn Spencer, [facility], MMS [facility] [phone] (phone) [phone] (fax)  [facility]

## 2023-11-11 ENCOUNTER — Ambulatory Visit: Admitting: Hematology

## 2023-11-12 LAB — MOLECULAR PATHOLOGY

## 2023-11-15 LAB — SIGNATERA
SIGNATERA MTM READOUT: 0 MTM/ml
SIGNATERA TEST RESULT: NEGATIVE

## 2023-11-17 NOTE — Addendum Note (Signed)
 Addended by: Wojciech Willetts on: 11/17/2023 09:18 PM   Modules accepted: Level of Service

## 2023-11-18 ENCOUNTER — Inpatient Hospital Stay: Admitting: Hematology

## 2023-11-21 LAB — MOLECULAR PATHOLOGY

## 2023-11-23 ENCOUNTER — Other Ambulatory Visit: Payer: Self-pay | Admitting: Adult Health

## 2023-11-25 ENCOUNTER — Ambulatory Visit (INDEPENDENT_AMBULATORY_CARE_PROVIDER_SITE_OTHER): Payer: Medicare Other

## 2023-11-25 VITALS — BP 120/70 | Ht 68.0 in | Wt 140.0 lb

## 2023-11-25 DIAGNOSIS — Z0000 Encounter for general adult medical examination without abnormal findings: Secondary | ICD-10-CM

## 2023-11-25 DIAGNOSIS — Z5320 Procedure and treatment not carried out because of patient's decision for unspecified reasons: Secondary | ICD-10-CM

## 2023-11-25 NOTE — Progress Notes (Signed)
 Subjective:   Stacey Fitzgerald is a 83 y.o. who presents for a Medicare Wellness preventive visit.  Visit Complete: In person  VideoDeclined- This patient declined Interactive audio and Acupuncturist. Therefore the visit was completed with audio only.  Persons Participating in Visit: Patient.  AWV Questionnaire: Yes: Patient Medicare AWV questionnaire was completed by the patient on 11/23/2023; I have confirmed that all information answered by patient is correct and no changes since this date.  Cardiac Risk Factors include: dyslipidemia;advanced age (>84men, >55 women);hypertension;sedentary lifestyle     Objective:    Today's Vitals   11/25/23 0928  BP: 120/70  Weight: 140 lb (63.5 kg)  Height: 5\' 8"  (1.727 m)   Body mass index is 21.29 kg/m.     11/25/2023    9:34 AM 10/21/2023    8:34 AM 10/11/2023    7:16 AM 10/09/2023    2:18 PM 10/08/2023    8:48 AM 11/20/2022    3:39 PM 11/08/2021   10:59 AM  Advanced Directives  Does Patient Have a Medical Advance Directive? No Yes Yes Yes Yes Yes No  Type of Special educational needs teacher of [place];Living will Healthcare Power of [place];Living will Healthcare Power of [place];Living will Healthcare Power of [place];Living will    Does patient want to make changes to medical advance directive?  No - Patient declined   No - Patient declined    Copy of Healthcare Power of Attorney in Chart?  No - copy requested   No - copy requested    Would patient like information on creating a medical advance directive? No - Patient declined      No - Patient declined    Current Medications (verified) Outpatient Encounter Medications as of 11/25/2023  Medication Sig   acetaminophen  (TYLENOL ) 500 MG tablet Take 500 mg by mouth every 6 (six) hours as needed.   docusate sodium  (COLACE) 100 MG capsule Take 1 capsule (100 mg total) by mouth 2 (two) times daily.   ferrous sulfate  325 (65 FE) MG tablet Take 1 tablet (325 mg total) by  mouth 2 (two) times daily with a meal.   lisinopril  (ZESTRIL ) 20 MG tablet Take 1 tablet (20 mg total) by mouth 2 (two) times daily.   simvastatin  (ZOCOR ) 20 MG tablet Take 1 tablet (20 mg total) by mouth at bedtime.   ondansetron  (ZOFRAN ) 4 MG tablet Take 1 tablet (4 mg total) by mouth every 6 (six) hours as needed for nausea. (Patient not taking: Reported on 11/25/2023)   pantoprazole  (PROTONIX ) 40 MG tablet Take 1 tablet (40 mg total) by mouth 2 (two) times daily. (Patient not taking: Reported on 11/25/2023)   No facility-administered encounter medications on file as of 11/25/2023.    Allergies (verified) Cephalexin   History: Past Medical History:  Diagnosis Date   Arthritis    Blood transfusion without reported diagnosis    10/09/23   Cancer (HCC)    10/10/23   Female cystocele    Hyperlipidemia    Hypertension    Hypothyroidism    Menopausal state    Obesity, unspecified    Pessary maintenance    Rectocele    Past Surgical History:  Procedure Laterality Date   ABDOMINAL HYSTERECTOMY     BREAST EXCISIONAL BIOPSY Left 1972   benign   BREAST SURGERY     benign cyst   COLON SURGERY  10/11/2023   COLONOSCOPY     COLONOSCOPY N/A 10/09/2023   Procedure: COLONOSCOPY;  Surgeon: Riley Cheadle,  Windsor Hatcher, MD;  [facility];  Service: Endoscopy;  Laterality: N/A;   COLONOSCOPY WITH PROPOFOL  N/A 11/23/2019   Procedure: COLONOSCOPY WITH PROPOFOL ;  Surgeon: Toledo, Alphonsus Jeans, MD;  Location: [facility];  Service: Gastroenterology;  Laterality: N/A;   ESOPHAGOGASTRODUODENOSCOPY N/A 10/09/2023   Procedure: EGD (ESOPHAGOGASTRODUODENOSCOPY);  Surgeon: Suzette Espy, MD;  Location: [facility];  Service: Endoscopy;  Laterality: N/A;   miscarrage     TUBAL LIGATION     uterine pessary     Family History  Problem Relation Age of Onset   Diabetes Mother    Seizures Mother    CVA Father    Dementia Brother    Cancer Brother    Diabetes Maternal Grandfather    Mental illness  Sister    Breast cancer Neg Hx    Social History   Socioeconomic History   Marital status: Married    Spouse name: Not on file   Number of children: 2   Years of education: Not on file   Highest education level: 12th grade  Occupational History   Occupation: retired  Tobacco Use   Smoking status: Never   Smokeless tobacco: Never  Vaping Use   Vaping status: Never Used  Substance and Sexual Activity   Alcohol use: Not Currently   Drug use: No   Sexual activity: Not Currently  Other Topics Concern   Not on file  Social History Narrative   Not on file   Social Drivers of Health   Financial Resource Strain: Low Risk  (11/25/2023)   Overall Financial Resource Strain (CARDIA)    Difficulty of Paying Living Expenses: Not hard at all  Food Insecurity: No Food Insecurity (11/25/2023)   Hunger Vital Sign    Worried About Running Out of Food in the Last Year: Never true    Ran Out of Food in the Last Year: Never true  Transportation Needs: No Transportation Needs (11/25/2023)   PRAPARE - Administrator, Civil Service (Medical): No    Lack of Transportation (Non-Medical): No  Recent Concern: Transportation Needs - Unmet Transportation Needs (10/08/2023)   PRAPARE - Transportation    Lack of Transportation (Medical): No    Lack of Transportation (Non-Medical): Yes  Physical Activity: Patient Declined (11/25/2023)   Exercise Vital Sign    Days of Exercise per Week: Patient declined    Minutes of Exercise per Session: Patient declined  Recent Concern: Physical Activity - Inactive (10/02/2023)   Exercise Vital Sign    Days of Exercise per Week: 0 days    Minutes of Exercise per Session: 0 min  Stress: No Stress Concern Present (11/25/2023)   [employer] of Occupational Health - Occupational Stress Questionnaire    Feeling of Stress : Not at all  Social Connections: Moderately Integrated (11/25/2023)   Social Connection and Isolation Panel [NHANES]    Frequency of  Communication with Friends and Family: More than three times a week    Frequency of Social Gatherings with Friends and Family: More than three times a week    Attends Religious Services: More than 4 times per year    Active Member of [employer] or Organizations: Yes    Attends Banker Meetings: More than 4 times per year    Marital Status: Widowed    Tobacco Counseling Counseling given: Not Answered    Clinical Intake:  Pre-visit preparation completed: Yes  Pain : No/denies pain     BMI - recorded: 21.29  Nutritional Risks: None Diabetes: No  Lab Results  Component Value Date   HGBA1C 5.2 10/10/2023   HGBA1C 5.4 05/15/2016   HGBA1C 5.6 11/15/2015     How often do you need to have someone help you when you read instructions, pamphlets, or other written materials from your doctor or pharmacy?: 1 - Never  Interpreter Needed?: No  Information entered by :: Sally Crazier CMA   Activities of Daily Living     11/23/2023    9:35 AM 10/08/2023    4:11 PM  In your present state of health, do you have any difficulty performing the following activities:  Hearing? 1 1  Vision? 0 0  Difficulty concentrating or making decisions? 1 0  Walking or climbing stairs? 0   Dressing or bathing? 0   Doing errands, shopping? 0 0  Preparing Food and eating ? N   Using the Toilet? N   In the past six months, have you accidently leaked urine? N   Do you have problems with loss of bowel control? Y   Managing your Medications? N   Managing your Finances? N   Housekeeping or managing your Housekeeping? N     Patient Care Team: Ostwalt, [name], PA-C as PCP - General (Physician Assistant) Paulett Boros, MD as Medical Oncologist (Medical Oncology) Gerhard Knuckles, RN as Oncology Nurse Navigator (Medical Oncology)  Indicate any recent Medical Services you may have received from other than [facility] providers in the past year (date may be approximate).     Assessment:   This is a  routine wellness examination for [place].  Hearing/Vision screen Hearing Screening - Comments:: Patient denies any hearing difficulties.   Vision Screening - Comments:: Patient is not up to date on yearly eye exams.  Patient declines referral    Goals Addressed             This Visit's Progress    Patient Stated       "To get better"        Depression Screen     11/25/2023    9:34 AM 11/08/2023   10:19 AM 10/03/2023    2:01 PM 11/29/2022    9:41 AM 11/20/2022    3:35 PM 10/24/2022    9:43 AM 05/25/2022    8:56 AM  PHQ 2/9 Scores  PHQ - 2 Score 0 0 0 0 1 0 0  PHQ- 9 Score 0 3 0 0 1 0 0    Fall Risk     11/23/2023    9:35 AM 10/03/2023    2:02 PM 11/29/2022    9:41 AM 11/20/2022    3:33 PM 10/24/2022    9:42 AM  Fall Risk   Falls in the past year? 1 0 1 0 0  Number falls in past yr: 0 0 0 0 0  Injury with Fall? 0 0 0 0 0  Risk for fall due to : Impaired balance/gait;History of fall(s);Impaired mobility;Orthopedic patient   No Fall Risks No Fall Risks  Follow up Education provided;Falls evaluation completed;Falls prevention discussed   Falls prevention discussed;Education provided Falls evaluation completed    MEDICARE RISK AT HOME:  Medicare Risk at Home Any stairs in or around the home?: (Patient-Rptd) Yes If so, are there any without handrails?: (Patient-Rptd) No Home free of loose throw rugs in walkways, pet beds, electrical cords, etc?: (Patient-Rptd) Yes Adequate lighting in your home to reduce risk of falls?: (Patient-Rptd) Yes Life alert?: (Patient-Rptd) No Use of a cane, walker  or w/c?: (Patient-Rptd) Yes Grab bars in the bathroom?: (Patient-Rptd) No Shower chair or bench in shower?: (Patient-Rptd) Yes Elevated toilet seat or a handicapped toilet?: (Patient-Rptd) No  TIMED UP AND GO:  Was the test performed?  No  Cognitive Function: 6CIT completed    10/24/2022   10:01 AM  MMSE - Mini Mental State Exam  Orientation to time 2  Orientation to Place 3   Registration 2  Attention/ Calculation 1  Recall 1  Language- name 2 objects 2  Language- repeat 0  Language- follow 3 step command 2  Language- read & follow direction 1  Write a sentence 1  Copy design 1  Total score 16        11/25/2023    9:31 AM 11/20/2022    3:41 PM  6CIT Screen  What Year? 0 points 0 points  What month? 0 points 0 points  What time? 0 points 0 points  Count back from 20 0 points 4 points  Months in reverse 0 points 4 points  Repeat phrase 4 points 8 points  Total Score 4 points 16 points    Immunizations Immunization History  Administered Date(s) Administered   Moderna Sars-Covid-2 Vaccination 08/04/2019, 09/01/2019    Screening Tests Health Maintenance  Topic Date Due   DTaP/Tdap/Td (1 - Tdap) Never done   Zoster Vaccines- Shingrix (1 of 2) Never done   Pneumonia Vaccine 53+ Years old (1 of 1 - PCV) Never done   DEXA SCAN  02/14/2018   COVID-19 Vaccine (3 - Moderna risk series) 09/29/2019   INFLUENZA VACCINE  02/21/2024   Medicare Annual Wellness (AWV)  11/24/2024   Colonoscopy  10/08/2028   HPV VACCINES  Aged Out   Meningococcal B Vaccine  Aged Out    Health Maintenance  Health Maintenance Due  Topic Date Due   DTaP/Tdap/Td (1 - Tdap) Never done   Zoster Vaccines- Shingrix (1 of 2) Never done   Pneumonia Vaccine 8+ Years old (1 of 1 - PCV) Never done   DEXA SCAN  02/14/2018   COVID-19 Vaccine (3 - Moderna risk series) 09/29/2019   Health Maintenance Items Addressed: Patient declined mammogram and bone density scan  Additional Screening:  Vision Screening: Recommended annual ophthalmology exams for early detection of glaucoma and other disorders of the eye. Patient declined referral for eye exam Dental Screening: Recommended annual dental exams for proper oral hygiene  Community Resource Referral / Chronic Care Management: CRR required this visit?  No   CCM required this visit?  No     Plan:     I have personally  reviewed and noted the following in the patient's chart:   Medical and social history Use of alcohol, tobacco or illicit drugs  Current medications and supplements including opioid prescriptions. Patient is not currently taking opioid prescriptions. Functional ability and status Nutritional status Physical activity Advanced directives List of other physicians Hospitalizations, surgeries, and ER visits in previous 12 months Vitals Screenings to include cognitive, depression, and falls Referrals and appointments  In addition, I have reviewed and discussed with patient certain preventive protocols, quality metrics, and best practice recommendations. A written personalized care plan for preventive services as well as general preventive health recommendations were provided to patient.      Shaarav Ripple, CMA   11/25/2023   After Visit Summary: (MyChart) Due to this being a telephonic visit, the after visit summary with patients personalized plan was offered to patient via MyChart   Notes: Please refer to  Routing Comments.

## 2023-11-25 NOTE — Patient Instructions (Signed)
 Stacey Fitzgerald , Thank you for taking time to come for your Medicare Wellness Visit. I appreciate your ongoing commitment to your health goals. Please review the following plan we discussed and let me know if I can assist you in the future.   Referrals/Orders/Follow-Ups/Clinician Recommendations:    Next Medicare AWV: Dec 01, 2024 at 10:10 am video visit.      Aim for 30 minutes of exercise or brisk walking, 6-8 glasses of water , and 5 servings of fruits and vegetables each day.   This is a list of the screening recommended for you and due dates:  Health Maintenance  Topic Date Due   DTaP/Tdap/Td vaccine (1 - Tdap) Never done   Zoster (Shingles) Vaccine (1 of 2) Never done   Pneumonia Vaccine (1 of 1 - PCV) Never done   DEXA scan (bone density measurement)  02/14/2018   COVID-19 Vaccine (3 - Moderna risk series) 09/29/2019   Flu Shot  02/21/2024   Medicare Annual Wellness Visit  11/24/2024   Colon Cancer Screening  10/08/2028   HPV Vaccine  Aged Out   Meningitis B Vaccine  Aged Out    Advanced directives: (Declined) Advance directive discussed with you today. Even though you declined this today, please call our office should you change your mind, and we can give you the proper paperwork for you to fill out. Advance Care Planning is important because it:  [x]  Makes sure you receive the medical care that is consistent with your values, goals, and preferences  [x]  It provides guidance to your family and loved ones and it also reduces their decisional burden about whether or not they are making the right decisions based on what you want done  Follow the link provided in your after visit summary or read over the paperwork we have mailed to you to help you started getting your Advance Directives in place. If you need assistance in completing these, please reach out to us  so that we can help you!  Next Medicare Annual Wellness Visit scheduled for next year: yes  Understanding Your Risk for  Falls Millions of people have serious injuries from falls each year. It is important to understand your risk of falling. Talk with your health care provider about your risk and what you can do to lower it. If you do have a serious fall, make sure to tell your provider. Falling once raises your risk of falling again. How can falls affect me? Serious injuries from falls are common. These include: Broken bones, such as hip fractures. Head injuries, such as traumatic brain injuries (TBI) or concussions. A fear of falling can cause you to avoid activities and stay at home. This can make your muscles weaker and raise your risk for a fall. What can increase my risk? There are a number of risk factors that increase your risk for falling. The more risk factors you have, the higher your risk of falling. Serious injuries from a fall happen most often to people who are older than 82 years old. Teenagers and young adults ages 14-29 are also at higher risk. Common risk factors include: Weakness in the lower body. Being generally weak or confused due to long-term (chronic) illness. Dizziness or balance problems. Poor vision. Medicines that cause dizziness or drowsiness. These may include: Medicines for your blood pressure, heart, anxiety, insomnia, or swelling (edema). Pain medicines. Muscle relaxants. Other risk factors include: Drinking alcohol. Having had a fall in the past. Having foot pain or wearing improper footwear.  Working at a dangerous job. Having any of the following in your home: Tripping hazards, such as floor clutter or loose rugs. Poor lighting. Pets. Having dementia or memory loss. What actions can I take to lower my risk of falling?  Physical activity Stay physically fit. Do strength and balance exercises. Consider taking a regular class to build strength and balance. Yoga and tai chi are good options. Vision Have your eyes checked every year and your prescription for glasses or  contacts updated as needed. Shoes and walking aids Wear non-skid shoes. Wear shoes that have rubber soles and low heels. Do not wear high heels. Do not walk around the house in socks or slippers. Use a cane or walker as told by your provider. Home safety Attach secure railings on both sides of your stairs. Install grab bars for your bathtub, shower, and toilet. Use a non-skid mat in your bathtub or shower. Attach bath mats securely with double-sided, non-slip rug tape. Use good lighting in all rooms. Keep a flashlight near your bed. Make sure there is a clear path from your bed to the bathroom. Use night-lights. Do not use throw rugs. Make sure all carpeting is taped or tacked down securely. Remove all clutter from walkways and stairways, including extension cords. Repair uneven or broken steps and floors. Avoid walking on icy or slippery surfaces. Walk on the grass instead of on icy or slick sidewalks. Use ice melter to get rid of ice on walkways in the winter. Use a cordless phone. Questions to ask your health care provider Can you help me check my risk for a fall? Do any of my medicines make me more likely to fall? Should I take a vitamin D supplement? What exercises can I do to improve my strength and balance? Should I make an appointment to have my vision checked? Do I need a bone density test to check for weak bones (osteoporosis)? Would it help to use a cane or a walker? Where to find more information Centers for Disease Control and Prevention, STEADI: TonerPromos.no Community-Based Fall Prevention Programs: TonerPromos.no [employer] on Aging: BaseRingTones.pl Contact a health care provider if: You fall at home. You are afraid of falling at home. You feel weak, drowsy, or dizzy. This information is not intended to replace advice given to you by your health care provider. Make sure you discuss any questions you have with your health care provider. Document Revised: 03/12/2022 Document  Reviewed: 03/12/2022 Elsevier Patient Education  2024 [employer].

## 2023-11-26 NOTE — Progress Notes (Signed)
 [facility] [address], [place] [address]   Clinic Day:  11/27/2023  Referring physician: Ostwalt, [name], PA-C  Patient Care Team: Ostwalt, [name], PA-C as PCP - General (Physician Assistant) Stacey Boros, MD as Medical Oncologist (Medical Oncology) Gerhard Knuckles, RN as Oncology Nurse Navigator (Medical Oncology)   ASSESSMENT & PLAN:   Assessment:  1.  Stage II (T3 N0 M0) ascending colon adenocarcinoma: - Presentation: Severe anemia with hemoglobin 6.4, s/p 2 units PRBC - Colonoscopy (10/09/2023): In the proximal ascending colon, there is a spiral shaped semilunar/semiapple core neoplasm approximately 10 to 15 cm distal to the ileocecal valve.  Diverticulosis in the sigmoid colon.  Nonbleeding internal hemorrhoids.  Biopsy consistent with invasive moderately differentiated adenocarcinoma. - CTAP (10/10/2023): Focal lobular mass along the right side of the colon just above the ileocecal bowel.  Few borderline enlarged lymph nodes are seen in the medially adjacent mesentery in the right lower quadrant.  Tiny segment 8 dome liver lesion identified too small to characterize. - Right hemicolectomy on 10/11/2023 by Dr. Collene Dawson - Pathology: Invasive moderately differentiated mucinous adenocarcinoma, invading through muscularis propria, tumor arises within a tubular adenoma with high-grade dysplasia, margins free, 0/38 lymph nodes involved, pT3 pN0.  Loss of nuclear expression of MLH1 and PMS2. - MSI-high.  BRAF V600 mutation negative.  MLH1 hyper methylation positive.  This indicates sporadic nature. - CT chest (11/01/2023): Part solid nodule within the anterolateral right upper lobe measuring 2 cm with 5 mm solid component. - MRI liver (10/31/2023): Tiny benign nonenhancing fluid signal cyst in the central posterior right lobe of the liver, hepatic segment 7/8.  2.  Social/family history: - She is currently undergoing rehab at [facility].  She used to live by  herself prior to surgery and is independent of ADLs and IADLs.  Her son [name] (lives in [place]) is with her today.  Non-smoker. - Son had melanoma.  Brother had colon cancer in his 51s.   Plan:  1.  Stage II (T3 N0 M0) ascending colon adenocarcinoma, MSI high: - We have reviewed imaging studies with the patient and her son. - Signatera testing was negative.  CEA was normal. - We reviewed MSI testing results which showed MSI-high.  BRAF V600 mutation was negative.  MLH1 hyper methylation positive indicating sporadic nature of cancer. - No indication for adjuvant therapy at this time. - We discussed surveillance with blood work including CEA, LFTs every 3 months, imaging with CT AP with contrast every 6 months for the first 2 years.  Will also repeat Signatera test every 6 months.  RTC 3 months for follow-up with CEA and LFTs.  2.  Severe microcytic anemia: - We reviewed labs from 10/21/2023.  Ferritin is 121, percent saturation 8.  Folic acid and B12 are normal.  MMA was normal.  Hemoglobin was 9 with MCV 74.8. - She started taking iron tablet twice daily a month ago.  I have recommended that she cut back to once daily.  Will check ferritin and iron panel in 3 months.  3.  Right upper lobe part solid nodule: - Staging CT chest showed part solid nodule in the right upper lobe. - We will plan to repeat CT chest in 6 months.   Orders Placed This Encounter  Procedures   CBC with Differential    Standing Status:   Future    Expected Date:   02/24/2024    Expiration Date:   11/26/2024   Comprehensive metabolic panel  Standing Status:   Future    Expected Date:   02/24/2024    Expiration Date:   11/26/2024   CEA    Standing Status:   Future    Expected Date:   02/24/2024    Expiration Date:   11/26/2024   Iron and TIBC ([facility] ONLY)    Standing Status:   Future    Expected Date:   02/24/2024    Expiration Date:   11/26/2024   Ferritin    Standing Status:   Future    Expected  Date:   02/24/2024    Expiration Date:   11/26/2024      Stacey Fitzgerald,acting as a scribe for Stacey Boros, MD.,have documented all relevant documentation on the behalf of Stacey Boros, MD,as directed by  Stacey Boros, MD while in the presence of Stacey Boros, MD.  I, Stacey Boros MD, have reviewed the above documentation for accuracy and completeness, and I agree with the above.    Stacey Boros, MD   5/7/20252:36 PM  CHIEF COMPLAINT/PURPOSE OF CONSULT:   Diagnosis: ascending colon cancer   Cancer Staging  Colon cancer, ascending [facility]) Staging form: Colon and Rectum, AJCC 8th Edition - Clinical stage from 10/21/2023: Stage IIA (cT3, cN0, cM0) - Signed by Stacey Boros, MD on 10/21/2023    Prior Therapy: right hemicolectomy, 10/11/23  Current Therapy: Under workup   HISTORY OF PRESENT ILLNESS:   Oncology History  Colon cancer, ascending (HCC)  10/10/2023 Initial Diagnosis   Colon cancer, ascending (HCC)   10/21/2023 Cancer Staging   Staging form: Colon and Rectum, AJCC 8th Edition - Clinical stage from 10/21/2023: Stage IIA (cT3, cN0, cM0) - Signed by Stacey Boros, MD on 10/21/2023 Histopathologic type: Adenocarcinoma, NOS Stage prefix: Initial diagnosis Total positive nodes: 0 Total nodes examined: 38 Histologic grade (G): G2 Histologic grading system: 4 grade system       Stacey Fitzgerald is a 83 y.o. female presenting to clinic today for evaluation of ascending colon cancer at the request of Drs. Rourk/Bridges.  She was referred to the ED on 10/08/23 for low hemoglobin of 6.7 on 10/04/23. A repeat CBC in the ED showed a slight drop in hemoglobin to 6.4. Additionally, hemoccult test was positive. She was given 2u pRBC and evaluated by GI. She proceeded to colonoscopy and EGD on 10/09/23 under Dr. Riley Cheadle. Colonoscopy revealed a spiral-shaped neoplasm approximately 10-15 cm distal to ileocecal valve. There was evidence of an ink  mark at the level of this tumor. Of note, a prior colonoscopy, performed on 11/23/19 by Dr. Corky Diener, found a tubular adenoma that was incompletely resected, and the area had been tattooed. Biopsy obtained from the area confirmed invasive moderately differentiated adenocarcinoma with mucinous features.   She was evaluated by Dr. Collene Dawson and underwent staging CT A/P on 10/10/23 showing: focal lobular mass along right side of colon near and just above ileocecal valve; a few borderline-enlarged lymph nodes seen in medially adjacent mesentery in RLQ.   She was taken for right colectomy on 10/11/23 and final pathology showed: 5.8 cm invasive moderately differentiated mucinous adenocarcinoma invading through muscularis propria, arising within a tubular adenoma with high-grade dysplasia; margins and all 38 lymph nodes were negative.  Today, she states that she is doing well overall. Her appetite level is at 60%. Her energy level is at 40%.  INTERVAL HISTORY:   Stacey Fitzgerald is a 83 y.o. female presenting to the clinic today for follow-up of ascending colon cancer. She was last  seen by me on 10/21/23.  Since her last visit, Signatera results have come back. MRI liver done on 10/31/23 found: Tiny benign nonenhancing fluid signal cyst in the central posterior right lobe of the liver, hepatic segment VII/VIII corresponding to CT finding. No suspicious liver lesion. Interval partial right hemicolectomy. Moderate hiatal hernia with intrathoracic position of the gastric fundus.  He had CT chest on 11/01/23 which showed: No acute findings within the chest. No signs to suggest metastatic disease to the chest. There is a part solid nodule within the anterolateral right upper lobe which measures 2.0 cm with 5 mm solid component. Low-grade pulmonary neoplastic process such as adenocarcinoma cannot be excluded. Moderate size hiatal hernia. Aortic Atherosclerosis   Today, she states that she is doing well overall. Her  appetite level is at 75%. Her energy level is at 50%.   PAST MEDICAL HISTORY:   Past Medical History: Past Medical History:  Diagnosis Date   Arthritis    Blood transfusion without reported diagnosis    10/09/23   Cancer (HCC)    10/10/23   Female cystocele    Hyperlipidemia    Hypertension    Hypothyroidism    Menopausal state    Obesity, unspecified    Pessary maintenance    Rectocele     Surgical History: Past Surgical History:  Procedure Laterality Date   ABDOMINAL HYSTERECTOMY     BREAST EXCISIONAL BIOPSY Left 1972   benign   BREAST SURGERY     benign cyst   COLON SURGERY  10/11/2023   COLONOSCOPY     COLONOSCOPY N/A 10/09/2023   Procedure: COLONOSCOPY;  Surgeon: Suzette Espy, MD;  [facility];  Service: Endoscopy;  Laterality: N/A;   COLONOSCOPY WITH PROPOFOL  N/A 11/23/2019   Procedure: COLONOSCOPY WITH PROPOFOL ;  Surgeon: Toledo, Alphonsus Jeans, MD;  Location: [facility];  Service: Gastroenterology;  Laterality: N/A;   ESOPHAGOGASTRODUODENOSCOPY N/A 10/09/2023   Procedure: EGD (ESOPHAGOGASTRODUODENOSCOPY);  Surgeon: Suzette Espy, MD;  Location: [facility];  Service: Endoscopy;  Laterality: N/A;   miscarrage     TUBAL LIGATION     uterine pessary      Social History: Social History   Socioeconomic History   Marital status: Married    Spouse name: Not on file   Number of children: 2   Years of education: Not on file   Highest education level: 12th grade  Occupational History   Occupation: retired  Tobacco Use   Smoking status: Never   Smokeless tobacco: Never  Vaping Use   Vaping status: Never Used  Substance and Sexual Activity   Alcohol use: Not Currently   Drug use: No   Sexual activity: Not Currently  Other Topics Concern   Not on file  Social History Narrative   Not on file   Social Drivers of Health   Financial Resource Strain: Low Risk  (11/25/2023)   Overall Financial Resource Strain (CARDIA)    Difficulty of Paying  Living Expenses: Not hard at all  Food Insecurity: No Food Insecurity (11/25/2023)   Hunger Vital Sign    Worried About Running Out of Food in the Last Year: Never true    Ran Out of Food in the Last Year: Never true  Transportation Needs: No Transportation Needs (11/25/2023)   PRAPARE - Administrator, Civil Service (Medical): No    Lack of Transportation (Non-Medical): No  Recent Concern: Transportation Needs - Unmet Transportation Needs (10/08/2023)   PRAPARE -  Administrator, Civil Service (Medical): No    Lack of Transportation (Non-Medical): Yes  Physical Activity: Patient Declined (11/25/2023)   Exercise Vital Sign    Days of Exercise per Week: Patient declined    Minutes of Exercise per Session: Patient declined  Recent Concern: Physical Activity - Inactive (10/02/2023)   Exercise Vital Sign    Days of Exercise per Week: 0 days    Minutes of Exercise per Session: 0 min  Stress: No Stress Concern Present (11/25/2023)   [employer] of Occupational Health - Occupational Stress Questionnaire    Feeling of Stress : Not at all  Social Connections: Moderately Integrated (11/25/2023)   Social Connection and Isolation Panel [NHANES]    Frequency of Communication with Friends and Family: More than three times a week    Frequency of Social Gatherings with Friends and Family: More than three times a week    Attends Religious Services: More than 4 times per year    Active Member of [employer] or Organizations: Yes    Attends Banker Meetings: More than 4 times per year    Marital Status: Widowed  Intimate Partner Violence: Not At Risk (11/25/2023)   Humiliation, Afraid, Rape, and Kick questionnaire    Fear of Current or Ex-Partner: No    Emotionally Abused: No    Physically Abused: No    Sexually Abused: No    Family History: Family History  Problem Relation Age of Onset   Diabetes Mother    Seizures Mother    CVA Father    Dementia Brother    Cancer  Brother    Diabetes Maternal Grandfather    Mental illness Sister    Breast cancer Neg Hx     Current Medications:  Current Outpatient Medications:    acetaminophen  (TYLENOL ) 500 MG tablet, Take 500 mg by mouth every 6 (six) hours as needed., Disp: , Rfl:    docusate sodium  (COLACE) 100 MG capsule, Take 1 capsule (100 mg total) by mouth 2 (two) times daily., Disp: 10 capsule, Rfl: 0   ferrous sulfate  325 (65 FE) MG tablet, Take 1 tablet (325 mg total) by mouth 2 (two) times daily with a meal., Disp: 60 tablet, Rfl: 0   lisinopril  (ZESTRIL ) 20 MG tablet, Take 1 tablet (20 mg total) by mouth 2 (two) times daily., Disp: 60 tablet, Rfl: 0   ondansetron  (ZOFRAN ) 4 MG tablet, Take 1 tablet (4 mg total) by mouth every 6 (six) hours as needed for nausea., Disp: 20 tablet, Rfl: 0   pantoprazole  (PROTONIX ) 40 MG tablet, Take 1 tablet (40 mg total) by mouth 2 (two) times daily., Disp: 60 tablet, Rfl: 0   simvastatin  (ZOCOR ) 20 MG tablet, Take 1 tablet (20 mg total) by mouth at bedtime., Disp: 30 tablet, Rfl: 0   Allergies: Allergies  Allergen Reactions   Cephalexin Hives    Patient reports that she is not allergic to this medication but she has not taken it since it was listed as an allergy    REVIEW OF SYSTEMS:   Review of Systems  Constitutional:  Negative for chills, fatigue and fever.  HENT:   Negative for lump/mass, mouth sores, nosebleeds, sore throat and trouble swallowing.   Eyes:  Negative for eye problems.  Respiratory:  Negative for cough and shortness of breath.   Cardiovascular:  Negative for chest pain, leg swelling and palpitations.  Gastrointestinal:  Negative for abdominal pain, constipation, diarrhea, nausea and vomiting.  Genitourinary:  Negative for bladder incontinence, difficulty urinating, dysuria, frequency, hematuria and nocturia.   Musculoskeletal:  Negative for arthralgias, back pain, flank pain, myalgias and neck pain.  Skin:  Negative for itching and rash.   Neurological:  Negative for dizziness, headaches and numbness.  Hematological:  Does not bruise/bleed easily.  Psychiatric/Behavioral:  Negative for depression, sleep disturbance and suicidal ideas. The patient is not nervous/anxious.   All other systems reviewed and are negative.    VITALS:   Blood pressure (!) 152/78, pulse (!) 116, temperature (!) 97.4 F (36.3 C), temperature source Tympanic, resp. rate 19, height 5\' 7"  (1.702 m), weight 138 lb 9.6 oz (62.9 kg), SpO2 97%.  Wt Readings from Last 3 Encounters:  11/27/23 138 lb 9.6 oz (62.9 kg)  11/25/23 140 lb (63.5 kg)  11/08/23 139 lb 6.4 oz (63.2 kg)    Body mass index is 21.71 kg/m.  Performance status (ECOG): 1 - Symptomatic but completely ambulatory  PHYSICAL EXAM:   Physical Exam Vitals and nursing note reviewed. Exam conducted with a chaperone present.  Constitutional:      Appearance: Normal appearance.  Cardiovascular:     Rate and Rhythm: Normal rate and regular rhythm.     Pulses: Normal pulses.     Heart sounds: Normal heart sounds.  Pulmonary:     Effort: Pulmonary effort is normal.     Breath sounds: Normal breath sounds.  Abdominal:     Palpations: Abdomen is soft. There is no hepatomegaly, splenomegaly or mass.     Tenderness: There is no abdominal tenderness.  Musculoskeletal:     Right lower leg: No edema.     Left lower leg: No edema.  Lymphadenopathy:     Cervical: No cervical adenopathy.     Right cervical: No superficial, deep or posterior cervical adenopathy.    Left cervical: No superficial, deep or posterior cervical adenopathy.     Upper Body:     Right upper body: No supraclavicular or axillary adenopathy.     Left upper body: No supraclavicular or axillary adenopathy.  Neurological:     General: No focal deficit present.     Mental Status: She is alert and oriented to person, place, and time.  Psychiatric:        Mood and Affect: Mood normal.        Behavior: Behavior normal.    Sutures at the surgical site present.  LABS:   CBC    Component Value Date/Time   WBC 14.5 (H) 10/21/2023 0856   RBC 4.09 10/21/2023 0856   HGB 9.0 (L) 10/21/2023 0856   HGB 6.7 (LL) 10/04/2023 0835   HCT 30.6 (L) 10/21/2023 0856   HCT 23.5 (L) 10/04/2023 0835   PLT 536 (H) 10/21/2023 0856   PLT 371 10/04/2023 0835   MCV 74.8 (L) 10/21/2023 0856   MCV 69 (L) 10/04/2023 0835   MCH 22.0 (L) 10/21/2023 0856   MCHC 29.4 (L) 10/21/2023 0856   RDW 22.7 (H) 10/21/2023 0856   RDW 14.7 10/04/2023 0835   LYMPHSABS 1.3 10/21/2023 0856   LYMPHSABS 1.9 10/04/2023 0835   MONOABS 1.0 10/21/2023 0856   EOSABS 0.2 10/21/2023 0856   EOSABS 0.2 10/04/2023 0835   BASOSABS 0.1 10/21/2023 0856   BASOSABS 0.1 10/04/2023 0835    CMP    Component Value Date/Time   NA 138 10/15/2023 0442   NA 140 10/04/2023 0835   K 3.7 10/15/2023 0442   CL 106 10/15/2023 0442   CO2 23 10/15/2023  0442   GLUCOSE 99 10/15/2023 0442   BUN 16 10/15/2023 0442   BUN 23 10/04/2023 0835   CREATININE 0.83 10/15/2023 0442   CREATININE 0.87 05/31/2017 0814   CALCIUM  8.9 10/15/2023 0442   PROT 6.8 10/21/2023 0856   PROT 6.4 10/04/2023 0835   ALBUMIN 3.2 (L) 10/21/2023 0856   ALBUMIN 4.2 10/04/2023 0835   AST 7 (L) 10/21/2023 0856   ALT 8 10/21/2023 0856   ALKPHOS 52 10/21/2023 0856   BILITOT 0.8 10/21/2023 0856   BILITOT 0.2 10/04/2023 0835   GFRNONAA >60 10/15/2023 0442   GFRNONAA 65 05/31/2017 0814   GFRAA 53 (L) 05/31/2020 0937   GFRAA 76 05/31/2017 0814     Lab Results  Component Value Date   CEA1 1.0 10/21/2023   /  CEA  Date Value Ref Range Status  10/21/2023 1.0 0.0 - 4.7 ng/mL Final    Comment:    (NOTE)                             Nonsmokers          <3.9                             Smokers             <5.6 Roche [employer] Electrochemiluminescence Immunoassay (ECLIA) Values obtained with different assay methods or kits cannot be used interchangeably.  Results cannot be interpreted  as absolute evidence of the presence or absence of malignant disease. Performed At: [facility] [address] [place], [place] [id number] Pearlean Botts MD [id number]    No results found for: "PSA1" No results found for: "CAN199" No results found for: "CAN125"  No results found for: "TOTALPROTELP", "ALBUMINELP", "A1GS", "A2GS", "BETS", "BETA2SER", "GAMS", "MSPIKE", "SPEI" Lab Results  Component Value Date   TIBC 260 10/21/2023   TIBC 368 10/08/2023   TIBC 349 10/04/2023   FERRITIN 121 10/21/2023   FERRITIN 4 (L) 10/08/2023   IRONPCTSAT 8 (L) 10/21/2023   IRONPCTSAT 2 (L) 10/08/2023   IRONPCTSAT 4 (LL) 10/04/2023   No results found for: "LDH"   STUDIES:   CT Chest Wo Contrast Result Date: 11/25/2023 CLINICAL DATA:  Colon cancer.  Staging.  * Tracking Code: BO *. EXAM: CT CHEST WITHOUT CONTRAST TECHNIQUE: Multidetector CT imaging of the chest was performed following the standard protocol without IV contrast. RADIATION DOSE REDUCTION: This exam was performed according to the departmental dose-optimization program which includes automated exposure control, adjustment of the mA and/or kV according to patient size and/or use of iterative reconstruction technique. COMPARISON:  None Available. FINDINGS: Cardiovascular: Heart size is upper limits of normal. Aortic atherosclerosis and coronary artery calcifications. No pericardial effusion. Mediastinum/Nodes: 1.6 cm nodule within the right lobe of thyroid  gland. In the setting of significant comorbidities or limited life expectancy, no follow-up recommended (ref: J Am Coll Radiol. 2015 Feb;12(2): 143-50). The trachea appears patent and is midline. Normal esophagus. Moderate size hiatal hernia. No enlarged mediastinal adenopathy. Hilar lymph nodes are suboptimally evaluated due to lack of IV contrast. Lungs/Pleura: No pleural effusion, airspace consolidation, atelectasis or pneumothorax. Within the anterolateral right upper lobe there is  a part solid nodule which measures 2.0 cm with 5 mm solid component, image 18/3. Upper Abdomen: No acute abnormality within the imaged portions of the upper abdomen. Bilateral renal sinus cysts identified. See dedicated MRI of the abdomen report  from 10/31/2023. Musculoskeletal: Several dense sclerotic foci within the right seventh and tenth ribs which are favored to represent benign bone islands. No acute or suspicious osseous findings. 2 degenerative disc disease identified within the thoracolumbar spine. IMPRESSION: 1. No acute findings within the chest. No signs to suggest metastatic disease to the chest. 2. There is a part solid nodule within the anterolateral right upper lobe which measures 2.0 cm with 5 mm solid component. Low-grade pulmonary neoplastic process such as adenocarcinoma cannot be excluded. Recommend follow-up non-contrast CT recommended at 3-6 months to confirm persistence. 3.   Moderate size hiatal hernia. 4.  Aortic Atherosclerosis (ICD10-I70.0). Electronically Signed   By: Kimberley Penman M.D.   On: 11/25/2023 04:37   MR LIVER W WO CONTRAST Result Date: 11/01/2023 CLINICAL DATA:  Colon cancer, characterize liver lesion EXAM: MRI ABDOMEN WITHOUT AND WITH CONTRAST TECHNIQUE: Multiplanar multisequence MR imaging of the abdomen was performed both before and after the administration of intravenous contrast. CONTRAST:  7mL GADAVIST  GADOBUTROL  1 MMOL/ML IV SOLN COMPARISON:  CT abdomen pelvis, 10/10/2023 FINDINGS: Lower chest: No acute abnormality. Moderate hiatal hernia with intrathoracic position of the gastric fundus. Hepatobiliary: No solid liver abnormality is seen. Tiny nonenhancing fluid signal cyst in the central posterior right lobe of the liver, hepatic segment VII/VIII corresponding to CT finding (series 21, image 24). Contracted gallbladder. No gallstones, gallbladder wall thickening, or biliary dilatation. Pancreas: Unremarkable. No pancreatic ductal dilatation or surrounding  inflammatory changes. Spleen: Normal in size without significant abnormality. Adrenals/Urinary Tract: Adrenal glands are unremarkable. Numerous bilateral renal cortical and parapelvic cysts, benign, requiring no further follow-up or characterization. Kidneys are otherwise normal, without obvious renal calculi, solid lesion, or hydronephrosis. Stomach/Bowel: Stomach is within normal limits. Large periampullary descending duodenal diverticulum. Interval partial right hemicolectomy. No evidence of bowel wall thickening, distention, or inflammatory changes. Vascular/Lymphatic: Aortic atherosclerosis. No enlarged abdominal lymph nodes. Other: No abdominal wall hernia or abnormality. No ascites. Musculoskeletal: No acute or significant osseous findings. IMPRESSION: 1. Tiny benign nonenhancing fluid signal cyst in the central posterior right lobe of the liver, hepatic segment VII/VIII corresponding to CT finding. No suspicious liver lesion. 2. Interval partial right hemicolectomy. 3. Moderate hiatal hernia with intrathoracic position of the gastric fundus. Aortic Atherosclerosis (ICD10-I70.0). Electronically Signed   By: Fredricka Jenny M.D.   On: 11/01/2023 07:10

## 2023-11-27 ENCOUNTER — Inpatient Hospital Stay: Attending: Hematology | Admitting: Hematology

## 2023-11-27 VITALS — BP 152/78 | HR 116 | Temp 97.4°F | Resp 19 | Ht 67.0 in | Wt 138.6 lb

## 2023-11-27 DIAGNOSIS — Z79899 Other long term (current) drug therapy: Secondary | ICD-10-CM | POA: Insufficient documentation

## 2023-11-27 DIAGNOSIS — Z9049 Acquired absence of other specified parts of digestive tract: Secondary | ICD-10-CM | POA: Insufficient documentation

## 2023-11-27 DIAGNOSIS — C182 Malignant neoplasm of ascending colon: Secondary | ICD-10-CM

## 2023-11-27 DIAGNOSIS — D508 Other iron deficiency anemias: Secondary | ICD-10-CM

## 2023-11-27 DIAGNOSIS — R911 Solitary pulmonary nodule: Secondary | ICD-10-CM | POA: Diagnosis present

## 2023-11-27 DIAGNOSIS — Z85038 Personal history of other malignant neoplasm of large intestine: Secondary | ICD-10-CM | POA: Insufficient documentation

## 2023-11-27 DIAGNOSIS — D509 Iron deficiency anemia, unspecified: Secondary | ICD-10-CM | POA: Insufficient documentation

## 2023-11-27 NOTE — Patient Instructions (Signed)
 [facility] Discharge Instructions   You were seen and examined today by Dr. Cheree Cords.  He reviewed the results of your lab work which are normal/stable.   He reviewed the results of your CT scan. It is normal except for a small spot on the lung. We will monitor this with a scan in 6 months.   We will see you back in 3 months with repeat lab.   Return as scheduled.    Thank you for choosing  [facility] Center at [facility] to provide your oncology and hematology care.  To afford each patient quality time with our provider, please arrive at least 15 minutes before your scheduled appointment time.   If you have a lab appointment with the Cancer Center please come in thru the Main Entrance and check in at the main information desk.  You need to re-schedule your appointment should you arrive 10 or more minutes late.  We strive to give you quality time with our providers, and arriving late affects you and other patients whose appointments are after yours.  Also, if you no show three or more times for appointments you may be dismissed from the clinic at the providers discretion.     Again, thank you for choosing [facility].  Our hope is that these requests will decrease the amount of time that you wait before being seen by our physicians.       _____________________________________________________________  Should you have questions after your visit to [facility], please contact our office at [phone] and follow the prompts.  Our office hours are 8:00 a.m. and 4:30 p.m. Monday - Friday.  Please note that voicemails left after 4:00 p.m. may not be returned until the following business day.  We are closed weekends and major holidays.  You do have access to a nurse 24-7, just call the main number to the clinic [phone] and do not press any options, hold on the line and a nurse will answer the phone.    For  prescription refill requests, have your pharmacy contact our office and allow 72 hours.    Due to Covid, you will need to wear a mask upon entering the hospital. If you do not have a mask, a mask will be given to you at the Main Entrance upon arrival. For doctor visits, patients may have 1 support person age 75 or older with them. For treatment visits, patients can not have anyone with them due to social distancing guidelines and our immunocompromised population.

## 2023-12-04 ENCOUNTER — Encounter: Payer: Self-pay | Admitting: General Surgery

## 2023-12-04 ENCOUNTER — Ambulatory Visit (INDEPENDENT_AMBULATORY_CARE_PROVIDER_SITE_OTHER): Admitting: General Surgery

## 2023-12-04 VITALS — BP 167/79 | HR 59 | Temp 97.4°F | Resp 12 | Ht 67.0 in | Wt 140.0 lb

## 2023-12-04 DIAGNOSIS — C182 Malignant neoplasm of ascending colon: Secondary | ICD-10-CM

## 2023-12-04 NOTE — Patient Instructions (Signed)
 Diet and activity as tolerated. Keep stools regular and soft. Follow up with Dr. Katragadda as planned.

## 2023-12-04 NOTE — Progress Notes (Signed)
 [facility]  Doing well. Getting around. Eating and having Bms. Here with granddaughter.  BP (!) 167/79   Pulse (!) 59   Temp (!) 97.4 F (36.3 C) (Oral)   Resp 12   Ht 5\' 7"  (1.702 m)   Wt 140 lb (63.5 kg)   SpO2 96%   BMI 21.93 kg/m  Soft, no signs of hernia, sites healed  Patient with colon cancer. Testing without need for adjuvant treatment.  Diet and activity as tolerated. Keep stools regular and soft. Follow up with Dr. Katragadda as planned.   Stacey Farrier, MD [facility] [address] [place], [place] [address] [phone] (office)

## 2023-12-05 ENCOUNTER — Encounter: Admitting: General Surgery

## 2023-12-20 ENCOUNTER — Encounter: Payer: Self-pay | Admitting: Physician Assistant

## 2023-12-20 ENCOUNTER — Ambulatory Visit: Admitting: Physician Assistant

## 2023-12-20 VITALS — BP 138/59 | HR 60 | Resp 16 | Ht 68.0 in | Wt 140.0 lb

## 2023-12-20 DIAGNOSIS — Z9049 Acquired absence of other specified parts of digestive tract: Secondary | ICD-10-CM

## 2023-12-20 DIAGNOSIS — Z9181 History of falling: Secondary | ICD-10-CM

## 2023-12-20 DIAGNOSIS — D649 Anemia, unspecified: Secondary | ICD-10-CM

## 2023-12-20 DIAGNOSIS — I10 Essential (primary) hypertension: Secondary | ICD-10-CM

## 2023-12-20 DIAGNOSIS — E7800 Pure hypercholesterolemia, unspecified: Secondary | ICD-10-CM

## 2023-12-20 DIAGNOSIS — R5381 Other malaise: Secondary | ICD-10-CM

## 2023-12-20 DIAGNOSIS — R5383 Other fatigue: Secondary | ICD-10-CM

## 2023-12-20 DIAGNOSIS — F03A0 Unspecified dementia, mild, without behavioral disturbance, psychotic disturbance, mood disturbance, and anxiety: Secondary | ICD-10-CM

## 2023-12-20 DIAGNOSIS — W19XXXA Unspecified fall, initial encounter: Secondary | ICD-10-CM

## 2023-12-20 NOTE — Progress Notes (Signed)
 Established patient visit  Patient: Stacey Fitzgerald   DOB: [birth year]   83 y.o. Female  MRN: [id number] Visit Date: 12/20/2023  Today's healthcare provider: Blane Bunting, PA-C   Chief Complaint  Patient presents with   Follow-up    F/u on Chronic Disease.. no other concerns   Subjective     HPI     Follow-up    Additional comments: F/u on Chronic Disease.. no other concerns      Last edited by Estill Hemming, CMA on 12/20/2023 10:19 AM.       Discussed the use of AI scribe software for clinical note transcription with the patient, who gave verbal consent to proceed.  History of Present Illness Stacey Fitzgerald is an 83 year old female who presents for follow-up after recent surgery and treatment for cancer.  She underwent surgery in March and was hospitalized for three weeks. Her oncologist has released her, indicating no more signs of cancer. Follow-up blood work is scheduled for August. There are no spots on her liver or lungs, and the cancer had not spread to the lymph nodes. Additionally, there are no more polyps in the colon.  She is currently taking iron supplements, which have been reduced to once a day. She also takes simvastatin  and lisinopril .  She experienced a fall in April, resulting in soreness on her left side, but it is well now. She uses a cane regularly to prevent future falls and potential hip fractures.  Her blood pressure at home is usually around 120/5-6. She has a blood pressure cuff at home and is advised to monitor her blood pressure regularly.       11/25/2023    9:34 AM 11/08/2023   10:19 AM 10/03/2023    2:01 PM  Depression screen PHQ 2/9  Decreased Interest 0 0 0  Down, Depressed, Hopeless 0 0 0  PHQ - 2 Score 0 0 0  Altered sleeping 0 0 0  Tired, decreased energy 0 1 0  Change in appetite 0 1 0  Feeling bad or failure about yourself  0 0 0  Trouble concentrating 0 1 0  Moving slowly or fidgety/restless 0 0 0  Suicidal thoughts 0  0 0  PHQ-9 Score 0 3 0  Difficult doing work/chores Not difficult at all Not difficult at all       11/08/2023   10:19 AM 10/03/2023    2:02 PM  GAD 7 : Generalized Anxiety Score  Nervous, Anxious, on Edge 0 0  Control/stop worrying 0 0  Worry too much - different things 0 0  Trouble relaxing 0 0  Restless 0 0  Easily annoyed or irritable 0 0  Afraid - awful might happen 0 0  Total GAD 7 Score 0 0  Anxiety Difficulty Not difficult at all     Medications: Outpatient Medications Prior to Visit  Medication Sig   ferrous sulfate  325 (65 FE) MG tablet Take 1 tablet (325 mg total) by mouth 2 (two) times daily with a meal.   lisinopril  (ZESTRIL ) 20 MG tablet Take 1 tablet (20 mg total) by mouth 2 (two) times daily.   simvastatin  (ZOCOR ) 20 MG tablet Take 1 tablet (20 mg total) by mouth at bedtime.   acetaminophen  (TYLENOL ) 500 MG tablet Take 500 mg by mouth every 6 (six) hours as needed. (Patient not taking: Reported on 12/20/2023)   docusate sodium  (COLACE) 100 MG capsule Take 1 capsule (100 mg total) by mouth 2 (two) times daily. (  Patient not taking: Reported on 12/20/2023)   ondansetron  (ZOFRAN ) 4 MG tablet Take 1 tablet (4 mg total) by mouth every 6 (six) hours as needed for nausea. (Patient not taking: Reported on 12/20/2023)   pantoprazole  (PROTONIX ) 40 MG tablet Take 1 tablet (40 mg total) by mouth 2 (two) times daily.   No facility-administered medications prior to visit.    Review of Systems All negative Except see HPI       Objective    BP (!) 138/59 (BP Location: Left Arm, Patient Position: Sitting, Cuff Size: Normal)   Pulse 60   Resp 16   Ht 5\' 8"  (1.727 m)   Wt 140 lb (63.5 kg)   SpO2 100%   BMI 21.29 kg/m     Physical Exam Vitals reviewed.  Constitutional:      General: She is not in acute distress.    Appearance: Normal appearance. She is well-developed. She is not diaphoretic.  HENT:     Head: Normocephalic and atraumatic.  Eyes:     General: No  scleral icterus.    Conjunctiva/sclera: Conjunctivae normal.  Neck:     Thyroid : No thyromegaly.  Cardiovascular:     Rate and Rhythm: Normal rate and regular rhythm.     Pulses: Normal pulses.     Heart sounds: Normal heart sounds. No murmur heard. Pulmonary:     Effort: Pulmonary effort is normal. No respiratory distress.     Breath sounds: Normal breath sounds. No wheezing, rhonchi or rales.  Musculoskeletal:     Cervical back: Neck supple.     Right lower leg: No edema.     Left lower leg: No edema.  Lymphadenopathy:     Cervical: No cervical adenopathy.  Skin:    General: Skin is warm and dry.     Findings: No rash.     Comments: Surgical scars healed.  Neurological:     Mental Status: She is alert and oriented to person, place, and time. Mental status is at baseline.  Psychiatric:        Mood and Affect: Mood normal.        Behavior: Behavior normal.      No results found for any visits on 12/20/23.      Assessment & Plan S/P partial resection of colon (Primary) Colon cancer No recurrence, no liver or lung lesions, no lymph node involvement, no colon polyps. - Schedule follow-up with oncologist in August. - Perform blood work prior to August follow-up.  Iron deficiency anemia Chronic Improved blood levels with reduced iron supplementation. Discussed taking iron with food or vitamin C for better absorption. - Continue iron supplementation once daily. - Take iron with food or vitamin C to improve absorption. - Repeat blood work in three months to assess iron levels.  Hypertension Chronic Blood pressure slightly elevated. Discussed home monitoring and recording. - Monitor blood pressure at home. Continue lisinopril  20mg , low salt diet, staying active - Record blood pressure readings and bring to next appointment. - Schedule follow-up in three months unless blood pressure remains elevated.  Hypercholesteremia Chronic Lipids last checked two months ago. -  Include lipid panel in upcoming blood work. Continue simvastatin  20mg  daily Will follow-up  Physical debility Fall, initial encounter Recent fall without injury. Discussed cane use and home fall prevention measures. - Use cane for stability. - Check home for fall prevention measures. - Consider bone density scan in the future.  Severe anemia Reviewed most recent labs with hemoglobin of 9 Continue iron  supplements once daily Will recheck cbc   Pure hypercholesterolemia - Lipid panel  Mild dementia without behavioral disturbance, psychotic disturbance, mood disturbance, or anxiety, unspecified dementia type (HCC) Stable, follow-up with neurology  No orders of the defined types were placed in this encounter.   No follow-ups on file.   The patient was advised to call back or seek an in-person evaluation if the symptoms worsen or if the condition fails to improve as anticipated.  I discussed the assessment and treatment plan with the patient. The patient was provided an opportunity to ask questions and all were answered. The patient agreed with the plan and demonstrated an understanding of the instructions.  I, Dene Landsberg, PA-C have reviewed all documentation for this visit. The documentation on 12/20/2023  for the exam, diagnosis, procedures, and orders are all accurate and complete.  Blane Bunting, [facility], MMS [facility] [phone] (phone) [phone] (fax)  [facility]

## 2024-02-27 ENCOUNTER — Inpatient Hospital Stay: Attending: Hematology

## 2024-02-27 DIAGNOSIS — D508 Other iron deficiency anemias: Secondary | ICD-10-CM | POA: Insufficient documentation

## 2024-02-27 DIAGNOSIS — Z79899 Other long term (current) drug therapy: Secondary | ICD-10-CM | POA: Diagnosis not present

## 2024-02-27 DIAGNOSIS — C182 Malignant neoplasm of ascending colon: Secondary | ICD-10-CM | POA: Diagnosis not present

## 2024-02-27 DIAGNOSIS — K769 Liver disease, unspecified: Secondary | ICD-10-CM

## 2024-02-27 LAB — CBC WITH DIFFERENTIAL/PLATELET
Abs Immature Granulocytes: 0.01 K/uL (ref 0.00–0.07)
Basophils Absolute: 0.1 K/uL (ref 0.0–0.1)
Basophils Relative: 1 %
Eosinophils Absolute: 0.1 K/uL (ref 0.0–0.5)
Eosinophils Relative: 2 %
HCT: 38.8 % (ref 36.0–46.0)
Hemoglobin: 12.2 g/dL (ref 12.0–15.0)
Immature Granulocytes: 0 %
Lymphocytes Relative: 20 %
Lymphs Abs: 1.9 K/uL (ref 0.7–4.0)
MCH: 27.7 pg (ref 26.0–34.0)
MCHC: 31.4 g/dL (ref 30.0–36.0)
MCV: 88.2 fL (ref 80.0–100.0)
Monocytes Absolute: 0.6 K/uL (ref 0.1–1.0)
Monocytes Relative: 7 %
Neutro Abs: 6.6 K/uL (ref 1.7–7.7)
Neutrophils Relative %: 70 %
Platelets: 308 K/uL (ref 150–400)
RBC: 4.4 MIL/uL (ref 3.87–5.11)
RDW: 13.5 % (ref 11.5–15.5)
WBC: 9.4 K/uL (ref 4.0–10.5)
nRBC: 0 % (ref 0.0–0.2)

## 2024-02-27 LAB — IRON AND TIBC
Iron: 22 ug/dL — ABNORMAL LOW (ref 28–170)
Saturation Ratios: 6 % — ABNORMAL LOW (ref 10.4–31.8)
TIBC: 368 ug/dL (ref 250–450)
UIBC: 346 ug/dL

## 2024-02-27 LAB — COMPREHENSIVE METABOLIC PANEL WITH GFR
ALT: 14 U/L (ref 0–44)
AST: 12 U/L — ABNORMAL LOW (ref 15–41)
Albumin: 4 g/dL (ref 3.5–5.0)
Alkaline Phosphatase: 74 U/L (ref 38–126)
Anion gap: 11 (ref 5–15)
BUN: 20 mg/dL (ref 8–23)
CO2: 26 mmol/L (ref 22–32)
Calcium: 9.4 mg/dL (ref 8.9–10.3)
Chloride: 104 mmol/L (ref 98–111)
Creatinine, Ser: 1.07 mg/dL — ABNORMAL HIGH (ref 0.44–1.00)
GFR, Estimated: 52 mL/min — ABNORMAL LOW (ref >60)
Glucose, Bld: 112 mg/dL — ABNORMAL HIGH (ref 70–99)
Potassium: 4.2 mmol/L (ref 3.5–5.1)
Sodium: 141 mmol/L (ref 135–145)
Total Bilirubin: 0.6 mg/dL (ref 0.0–1.2)
Total Protein: 7.4 g/dL (ref 6.5–8.1)

## 2024-02-27 LAB — FERRITIN: Ferritin: 20 ng/mL (ref 11–307)

## 2024-02-28 LAB — CEA: CEA: 1.6 ng/mL (ref 0.0–4.7)

## 2024-03-02 ENCOUNTER — Other Ambulatory Visit: Payer: Self-pay | Admitting: Oncology

## 2024-03-02 ENCOUNTER — Encounter: Payer: Self-pay | Admitting: *Deleted

## 2024-03-02 DIAGNOSIS — D509 Iron deficiency anemia, unspecified: Secondary | ICD-10-CM | POA: Insufficient documentation

## 2024-03-02 DIAGNOSIS — D500 Iron deficiency anemia secondary to blood loss (chronic): Secondary | ICD-10-CM

## 2024-03-05 ENCOUNTER — Ambulatory Visit: Admitting: Hematology

## 2024-03-08 LAB — SIGNATERA
SIGNATERA MTM READOUT: 0 MTM/ml
SIGNATERA TEST RESULT: NEGATIVE

## 2024-03-11 ENCOUNTER — Inpatient Hospital Stay: Admitting: Oncology

## 2024-03-11 ENCOUNTER — Inpatient Hospital Stay

## 2024-03-11 VITALS — BP 131/61 | HR 64 | Temp 96.9°F | Resp 18

## 2024-03-11 VITALS — BP 141/71 | HR 61 | Temp 97.0°F | Resp 18 | Ht 68.0 in | Wt 146.0 lb

## 2024-03-11 DIAGNOSIS — D500 Iron deficiency anemia secondary to blood loss (chronic): Secondary | ICD-10-CM

## 2024-03-11 DIAGNOSIS — C182 Malignant neoplasm of ascending colon: Secondary | ICD-10-CM

## 2024-03-11 DIAGNOSIS — R911 Solitary pulmonary nodule: Secondary | ICD-10-CM | POA: Diagnosis not present

## 2024-03-11 DIAGNOSIS — D508 Other iron deficiency anemias: Secondary | ICD-10-CM | POA: Diagnosis not present

## 2024-03-11 MED ORDER — ACETAMINOPHEN 325 MG PO TABS
650.0000 mg | ORAL_TABLET | Freq: Once | ORAL | Status: AC
  Administered 2024-03-11: 650 mg via ORAL
  Filled 2024-03-11: qty 2

## 2024-03-11 MED ORDER — SODIUM CHLORIDE 0.9 % IV SOLN
510.0000 mg | Freq: Once | INTRAVENOUS | Status: AC
  Administered 2024-03-11: 510 mg via INTRAVENOUS
  Filled 2024-03-11: qty 510

## 2024-03-11 MED ORDER — FERROUS SULFATE 325 (65 FE) MG PO TABS: 325.0000 mg | ORAL_TABLET | ORAL | 0 refills | Status: DC

## 2024-03-11 MED ORDER — CETIRIZINE HCL 10 MG/ML IV SOLN
5.0000 mg | Freq: Once | INTRAVENOUS | Status: AC
  Administered 2024-03-11: 5 mg via INTRAVENOUS
  Filled 2024-03-11: qty 1

## 2024-03-11 MED ORDER — SODIUM CHLORIDE 0.9 % IV SOLN: INTRAVENOUS | Status: DC

## 2024-03-11 NOTE — Progress Notes (Signed)
Feraheme given today per MD orders. Tolerated infusion without adverse affects. Vital signs stable. No complaints at this time. Discharged from clinic by wheel chair in stable condition. Alert and oriented x 3. F/U with [facility] as scheduled.   

## 2024-03-11 NOTE — Progress Notes (Signed)
 Stage II (T3 N0 M0) ascending colon adenocarcinoma  Patient Care Team: Ostwalt, [name], PA-C as PCP - General (Physician Assistant) [name], RN as Oncology Nurse Navigator (Medical Oncology)  Clinic Day:  03/11/2024  Referring physician: Dineen Channel, PA-C   CHIEF COMPLAINT:  CC: Stage II (T3 N0 M0) ascending colon adenocarcinoma  Stacey Fitzgerald 83 y.o. female was transferred to my care after her prior physician has left.   ASSESSMENT & PLAN:   Assessment & Plan: Stacey Fitzgerald  is a 83 y.o. female with Stage II (T3 N0 M0) ascending colon adenocarcinoma   Assessment & Plan Colon cancer, ascending (HCC) Stage II (T3N0M0) ascending colon adenocarcinoma s/p hemicolectomy (10/11/2023).  MSI-high. CEA was normal.  Signatera test was negative.  - Patient has no indication for adjuvant chemotherapy - Patient does not have a recent CT scan.  The one available was from 10/10/2023 that was prior to hemicolectomy.  Will obtain a CT scan now. - Surveillance per NCCN guidelines: - H&E every 3 to 6 months for 2 years, then every 6 months for a total of 5 years - CEA every 3 to 6 months for 2 years, then every 6 months for a total of 5 years - CT CAP every 6 to 12 months from date of surgery for a total of 5 years  -Patient will need a colonoscopy at 1 year mark. Due 09/2024  Return to clinic in 3 months with labs Other iron  deficiency anemia Iron  deficiency anemia likely secondary to history of colon cancer. Currently anemia resolved.  Patient continues to have severe iron  deficiency  -We discussed some of the risks, benefits, and alternatives of intravenous iron  infusions. The patient is symptomatic from anemia and the iron  level is critically low. She tolerated oral iron  supplement poorly and desires to achieve higher levels of iron  faster for adequate hematopoesis. Some of the side-effects to be expected including risks of infusion reactions, phlebitis, headaches, nausea  and fatigue.  The patient is willing to proceed. Patient education material was dispensed. Goal is to keep ferritin level greater than 50 and resolution of anemia -Start oral iron  every other day.  Use MiraLAX for constipation  Return to clinic in 8 weeks with labs to assess response to IV iron   Lung nodule Patient has a right lung nodule about 2 cm with a 5 mm solid component on CT scan from 10/2023  - Will repeat a CT scan now and then every 6 months -Continue to monitor for now    The patient understands the plans discussed today and is in agreement with them.  She knows to contact our office if she develops concerns prior to her next appointment.  50 minutes of total time was spent for this patient encounter, including preparation, face-to-face counseling with the patient and coordination of care, physical exam, and documentation of the encounter. > 50% of the time was spent on counseling as documented under my assessment and plan.    [name], MD  [facility] [place] - A DEPT OF [name] [address] [place] [address] Dept: [phone] Dept Fax: [phone]   Orders Placed This Encounter  Procedures   CT ABDOMEN PELVIS W CONTRAST    Standing Status:   Future    Expected Date:   06/11/2024    Expiration Date:   03/11/2025    If indicated for the ordered procedure, I authorize the administration of contrast  media per Radiology protocol:   Yes    Does the patient have a contrast media/X-ray dye allergy?:   No    Preferred imaging location?:   [facility]    Release to patient:   Immediate [1]    If indicated for the ordered procedure, I authorize the administration of oral contrast media per Radiology protocol:   Yes   CBC with Differential    Standing Status:   Future    Expected Date:   04/11/2024    Expiration Date:   03/11/2025   Comprehensive metabolic panel    Standing Status:   Future     Expiration Date:   04/11/2024   CEA    Standing Status:   Future    Expiration Date:   04/11/2024   Iron  and TIBC ([facility] ONLY)    Standing Status:   Future    Expiration Date:   04/11/2024   Ferritin    Standing Status:   Future    Expected Date:   04/11/2024    Expiration Date:   03/11/2025   Vitamin B12    Standing Status:   Future    Expected Date:   04/11/2024    Expiration Date:   03/11/2025   Folate    Standing Status:   Future    Expected Date:   04/11/2024    Expiration Date:   03/11/2025     ONCOLOGY HISTORY:   I have reviewed her chart and materials related to her cancer extensively and collaborated history with the patient. Summary of oncologic history is as follows:   Stage II (T3 N0 M0) ascending colon adenocarcinoma:  -Presentation: Severe anemia with hemoglobin 6.4, s/p 2 units PRBC -10/09/2023: Colonoscopy : In the proximal ascending colon, there is a spiral shaped semilunar/semiapple core neoplasm approximately 10 to 15 cm distal to the ileocecal valve.  Diverticulosis in the sigmoid colon.  Nonbleeding internal hemorrhoids.  Biopsy consistent with invasive moderately differentiated adenocarcinoma. -10/10/2023 CTAP : Focal lobular mass along the right side of the colon just above the ileocecal bowel.  Few borderline enlarged lymph nodes are seen in the medially adjacent mesentery in the right lower quadrant.  Tiny segment 8 dome liver lesion identified too small to characterize. -10/11/2023:  Right hemicolectomy by Dr. Kallie - Pathology: Invasive moderately differentiated mucinous adenocarcinoma, invading through muscularis propria, tumor arises within a tubular adenoma with high-grade dysplasia, margins free, 0/38 lymph nodes involved, pT3 pN0.   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 - MSI-high.  BRAF V600 mutation negative.  MLH1 hyper methylation positive.  This indicates sporadic nature. -11/01/2023 CT chest: Part solid nodule within the  anterolateral right upper lobe measuring 2 cm with 5 mm solid component. -10/31/2023 MRI liver: Tiny benign nonenhancing fluid signal cyst in the central posterior right lobe of the liver, hepatic segment 7/8. -11/15/2023: Signatera: Negative - 03/08/2024: Signatera: Negative  Current Treatment:  Surveillance  INTERVAL HISTORY:   Stacey Fitzgerald is here today for follow up. Patient is accompanied by a good friend today.  She reported feeling tired and attributes it to being old.  She denies hematochezia, melena, loss of appetite or loss of weight.  Overall she is doing very well.   I have reviewed the past medical history, past surgical history, social history and family history with the patient and they are unchanged from previous note.  ALLERGIES:  is allergic to cephalexin.  MEDICATIONS:  Current Outpatient Medications  Medication Sig Dispense  Refill   acetaminophen  (TYLENOL ) 500 MG tablet Take 500 mg by mouth every 6 (six) hours as needed.     lisinopril  (ZESTRIL ) 20 MG tablet Take 1 tablet (20 mg total) by mouth 2 (two) times daily. 60 tablet 0   simvastatin  (ZOCOR ) 20 MG tablet Take 1 tablet (20 mg total) by mouth at bedtime. 30 tablet 0   ferrous sulfate  325 (65 FE) MG tablet Take 1 tablet (325 mg total) by mouth every other day. 60 tablet 0   No current facility-administered medications for this visit.   Facility-Administered Medications Ordered in Other Visits  Medication Dose Route Frequency Provider Last Rate Last Admin   0.9 %  sodium chloride  infusion   Intravenous Continuous Albany Winslow, MD   Stopped at 03/11/24 1244    REVIEW OF SYSTEMS:   Constitutional: Denies fevers, chills or abnormal weight loss Eyes: Denies blurriness of vision Ears, nose, mouth, throat, and face: Denies mucositis or sore throat Respiratory: Denies cough, dyspnea or wheezes Cardiovascular: Denies palpitation, chest discomfort or lower extremity swelling Gastrointestinal:  Denies  nausea, heartburn or change in bowel habits Skin: Denies abnormal skin rashes Lymphatics: Denies new lymphadenopathy or easy bruising Neurological:Denies numbness, tingling or new weaknesses Behavioral/Psych: Mood is stable, no new changes  All other systems were reviewed with the patient and are negative.   VITALS:  Blood pressure (!) 141/71, pulse 61, temperature (!) 97 F (36.1 C), temperature source Tympanic, resp. rate 18, height 5' 8 (1.727 m), weight 146 lb (66.2 kg), SpO2 98%.  Wt Readings from Last 3 Encounters:  03/11/24 146 lb (66.2 kg)  12/20/23 140 lb (63.5 kg)  12/04/23 140 lb (63.5 kg)    Body mass index is 22.2 kg/m.  Performance status (ECOG): 1 - Symptomatic but completely ambulatory  PHYSICAL EXAM:   GENERAL:alert, no distress and comfortable SKIN: skin color, texture, turgor are normal, no rashes or significant lesions LYMPH:  no palpable lymphadenopathy in the cervical, axillary or inguinal LUNGS: clear to auscultation and percussion with normal breathing effort HEART: regular rate & rhythm and no murmurs and no lower extremity edema ABDOMEN:abdomen soft, non-tender and normal bowel sounds Musculoskeletal:no cyanosis of digits and no clubbing  NEURO: alert & oriented x 3 with fluent speech, no focal motor/sensory deficits  LABORATORY DATA:  I have reviewed the data as listed    Component Value Date/Time   NA 141 02/27/2024 1433   NA 140 10/04/2023 0835   K 4.2 02/27/2024 1433   CL 104 02/27/2024 1433   CO2 26 02/27/2024 1433   GLUCOSE 112 (H) 02/27/2024 1433   BUN 20 02/27/2024 1433   BUN 23 10/04/2023 0835   CREATININE 1.07 (H) 02/27/2024 1433   CREATININE 0.87 05/31/2017 0814   CALCIUM  9.4 02/27/2024 1433   PROT 7.4 02/27/2024 1433   PROT 6.4 10/04/2023 0835   ALBUMIN 4.0 02/27/2024 1433   ALBUMIN 4.2 10/04/2023 0835   AST 12 (L) 02/27/2024 1433   ALT 14 02/27/2024 1433   ALKPHOS 74 02/27/2024 1433   BILITOT 0.6 02/27/2024 1433   BILITOT  0.2 10/04/2023 0835   GFRNONAA 52 (L) 02/27/2024 1433   GFRNONAA 65 05/31/2017 0814   GFRAA 53 (L) 05/31/2020 0937   GFRAA 76 05/31/2017 0814    Lab Results  Component Value Date   WBC 9.4 02/27/2024   NEUTROABS 6.6 02/27/2024   HGB 12.2 02/27/2024   HCT 38.8 02/27/2024   MCV 88.2 02/27/2024   PLT 308 02/27/2024  Chemistry      Component Value Date/Time   NA 141 02/27/2024 1433   NA 140 10/04/2023 0835   K 4.2 02/27/2024 1433   CL 104 02/27/2024 1433   CO2 26 02/27/2024 1433   BUN 20 02/27/2024 1433   BUN 23 10/04/2023 0835   CREATININE 1.07 (H) 02/27/2024 1433   CREATININE 0.87 05/31/2017 0814      Component Value Date/Time   CALCIUM  9.4 02/27/2024 1433   ALKPHOS 74 02/27/2024 1433   AST 12 (L) 02/27/2024 1433   ALT 14 02/27/2024 1433   BILITOT 0.6 02/27/2024 1433   BILITOT 0.2 10/04/2023 0835      Latest Reference Range & Units 02/27/24 14:33  Iron  28 - 170 ug/dL 22 (L)  UIBC ug/dL 653  TIBC 749 - 549 ug/dL 631  Saturation Ratios 10.4 - 31.8 % 6 (L)  Ferritin 11 - 307 ng/mL 20  (L): Data is abnormally low  RADIOGRAPHIC STUDIES: I have personally reviewed the radiological images as listed and agreed with the findings in the report.  CT Chest Wo Contrast CLINICAL DATA:  Colon cancer.  Staging.  * Tracking Code: BO *.  EXAM: CT CHEST WITHOUT CONTRAST  TECHNIQUE: Multidetector CT imaging of the chest was performed following the standard protocol without IV contrast.  RADIATION DOSE REDUCTION: This exam was performed according to the departmental dose-optimization program which includes automated exposure control, adjustment of the mA and/or kV according to patient size and/or use of iterative reconstruction technique.  COMPARISON:  None Available.  FINDINGS: Cardiovascular: Heart size is upper limits of normal. Aortic atherosclerosis and coronary artery calcifications. No pericardial effusion.  Mediastinum/Nodes: 1.6 cm nodule within the right  lobe of thyroid  gland. In the setting of significant comorbidities or limited life expectancy, no follow-up recommended (ref: J Am Coll Radiol. 2015 Feb;12(2): 143-50). The trachea appears patent and is midline. Normal esophagus. Moderate size hiatal hernia. No enlarged mediastinal adenopathy. Hilar lymph nodes are suboptimally evaluated due to lack of IV contrast.  Lungs/Pleura: No pleural effusion, airspace consolidation, atelectasis or pneumothorax. Within the anterolateral right upper lobe there is a part solid nodule which measures 2.0 cm with 5 mm solid component, image 18/3.  Upper Abdomen: No acute abnormality within the imaged portions of the upper abdomen. Bilateral renal sinus cysts identified. See dedicated MRI of the abdomen report from 10/31/2023.  Musculoskeletal: Several dense sclerotic foci within the right seventh and tenth ribs which are favored to represent benign bone islands. No acute or suspicious osseous findings. 2 degenerative disc disease identified within the thoracolumbar spine.  IMPRESSION: 1. No acute findings within the chest. No signs to suggest metastatic disease to the chest. 2. There is a part solid nodule within the anterolateral right upper lobe which measures 2.0 cm with 5 mm solid component. Low-grade pulmonary neoplastic process such as adenocarcinoma cannot be excluded. Recommend follow-up non-contrast CT recommended at 3-6 months to confirm persistence. 3.   Moderate size hiatal hernia. 4.  Aortic Atherosclerosis (ICD10-I70.0).  Electronically Signed   By: Waddell Calk M.D.   On: 11/25/2023 04:37

## 2024-03-11 NOTE — Assessment & Plan Note (Addendum)
 Stage II (T3N0M0) ascending colon adenocarcinoma s/p hemicolectomy (10/11/2023).  MSI-high. CEA was normal.  Signatera test was negative.  - Patient has no indication for adjuvant chemotherapy - Patient does not have a recent CT scan.  The one available was from 10/10/2023 that was prior to hemicolectomy.  Will obtain a CT scan now. - Surveillance per NCCN guidelines: - H&E every 3 to 6 months for 2 years, then every 6 months for a total of 5 years - CEA every 3 to 6 months for 2 years, then every 6 months for a total of 5 years - CT CAP every 6 to 12 months from date of surgery for a total of 5 years  -Patient will need a colonoscopy at 1 year mark. Due 09/2024  Return to clinic in 3 months with labs

## 2024-03-11 NOTE — Assessment & Plan Note (Addendum)
 Patient has a right lung nodule about 2 cm with a 5 mm solid component on CT scan from 10/2023  - Will repeat a CT scan now and then every 6 months -Continue to monitor for now

## 2024-03-11 NOTE — Patient Instructions (Signed)
 [facility] [place] - A DEPT OF [facility]  Discharge Instructions: Thank you for choosing [facility] to provide your oncology and hematology care.  If you have a lab appointment with the Cancer Center - please note that after April 8th, 2024, all labs will be drawn in the cancer center.  You do not have to check in or register with the main entrance as you have in the past but will complete your check-in in the cancer center.  Wear comfortable clothing and clothing appropriate for easy access to any Portacath or PICC line.   We strive to give you quality time with your provider. You may need to reschedule your appointment if you arrive late (15 or more minutes).  Arriving late affects you and other patients whose appointments are after yours.  Also, if you miss three or more appointments without notifying the office, you may be dismissed from the clinic at the provider's discretion.      For prescription refill requests, have your pharmacy contact our office and allow 72 hours for refills to be completed.    Today you received the following chemotherapy and/or immunotherapy agents Feraheme. Ferumoxytol Injection What is this medication? FERUMOXYTOL (FER ue MOX i tol) treats low levels of iron in your body (iron deficiency anemia). Iron is a mineral that plays an important role in making red blood cells, which carry oxygen from your lungs to the rest of your body. This medicine may be used for other purposes; ask your health care provider or pharmacist if you have questions. COMMON BRAND NAME(S): Feraheme What should I tell my care team before I take this medication? They need to know if you have any of these conditions: Anemia not caused by low iron levels High levels of iron in the blood Magnetic resonance imaging (MRI) test scheduled An unusual or allergic reaction to iron, other medications, foods, dyes, or preservatives Pregnant or trying to get  pregnant Breastfeeding How should I use this medication? This medication is injected into a vein. It is given by your care team in a hospital or clinic setting. Talk to your care team the use of this medication in children. Special care may be needed. Overdosage: If you think you have taken too much of this medicine contact a poison control center or emergency room at once. NOTE: This medicine is only for you. Do not share this medicine with others. What if I miss a dose? It is important not to miss your dose. Call your care team if you are unable to keep an appointment. What may interact with this medication? Other iron products This list may not describe all possible interactions. Give your health care provider a list of all the medicines, herbs, non-prescription drugs, or dietary supplements you use. Also tell them if you smoke, drink alcohol, or use illegal drugs. Some items may interact with your medicine. What should I watch for while using this medication? Visit your care team for regular checks on your progress. Tell your care team if your symptoms do not start to get better or if they get worse. You may need blood work done while you are taking this medication. You may need to eat more foods that contain iron. Talk to your care team. Foods that contain iron include whole grains or cereals, dried fruits, beans, peas, leafy green vegetables, and organ meats (liver, kidney). What side effects may I notice from receiving this medication? Side effects that  you should report to your care team as soon as possible: Allergic reactions--skin rash, itching, hives, swelling of the face, lips, tongue, or throat Low blood pressure--dizziness, feeling faint or lightheaded, blurry vision Shortness of breath Side effects that usually do not require medical attention (report to your care team if they continue or are bothersome): Flushing Headache Joint pain Muscle pain Nausea Pain, redness, or  irritation at injection site This list may not describe all possible side effects. Call your doctor for medical advice about side effects. You may report side effects to FDA at 1-800-FDA-1088. Where should I keep my medication? This medication is given in a hospital or clinic. It will not be stored at home. NOTE: This sheet is a summary. It may not cover all possible information. If you have questions about this medicine, talk to your doctor, pharmacist, or health care provider.  2024 Elsevier/Gold Standard (2023-02-27 00:00:00)      To help prevent nausea and vomiting after your treatment, we encourage you to take your nausea medication as directed.  BELOW ARE SYMPTOMS THAT SHOULD BE REPORTED IMMEDIATELY: *FEVER GREATER THAN 100.4 F (38 C) OR HIGHER *CHILLS OR SWEATING *NAUSEA AND VOMITING THAT IS NOT CONTROLLED WITH YOUR NAUSEA MEDICATION *UNUSUAL SHORTNESS OF BREATH *UNUSUAL BRUISING OR BLEEDING *URINARY PROBLEMS (pain or burning when urinating, or frequent urination) *BOWEL PROBLEMS (unusual diarrhea, constipation, pain near the anus) TENDERNESS IN MOUTH AND THROAT WITH OR WITHOUT PRESENCE OF ULCERS (sore throat, sores in mouth, or a toothache) UNUSUAL RASH, SWELLING OR PAIN  UNUSUAL VAGINAL DISCHARGE OR ITCHING   Items with * indicate a potential emergency and should be followed up as soon as possible or go to the Emergency Department if any problems should occur.  Please show the CHEMOTHERAPY ALERT CARD or IMMUNOTHERAPY ALERT CARD at check-in to the Emergency Department and triage nurse.  Should you have questions after your visit or need to cancel or reschedule your appointment, please contact [facility] [place] - A DEPT OF Eligha Bridegroom [facility] [phone]  and follow the prompts.  Office hours are 8:00 a.m. to 4:30 p.m. Monday - Friday. Please note that voicemails left after 4:00 p.m. may not be returned until the following business day.  We are closed weekends  and major holidays. You have access to a nurse at all times for urgent questions. Please call the main number to the clinic [phone] and follow the prompts.  For any non-urgent questions, you may also contact your provider using MyChart. We now offer e-Visits for anyone 67 and older to request care online for non-urgent symptoms. For details visit [email or web address].   Also download the MyChart app! Go to the app store, search "MyChart", open the app, select [place], and log in with your MyChart username and password.

## 2024-03-11 NOTE — Patient Instructions (Addendum)
 [facility] at [facility] Discharge Instructions   You were seen and examined today by Dr. Davonna.  She reviewed the results of your lab work which are normal/stable.   We will see you back in 6 weeks. We will repeat lab work and a CT scan prior to this visit.   Return as scheduled.    Thank you for choosing [facility] at [facility] to provide your oncology and hematology care.  To afford each patient quality time with our provider, please arrive at least 15 minutes before your scheduled appointment time.   If you have a lab appointment with the Cancer Center please come in thru the Main Entrance and check in at the main information desk.  You need to re-schedule your appointment should you arrive 10 or more minutes late.  We strive to give you quality time with our providers, and arriving late affects you and other patients whose appointments are after yours.  Also, if you no show three or more times for appointments you may be dismissed from the clinic at the providers discretion.     Again, thank you for choosing [facility].  Our hope is that these requests will decrease the amount of time that you wait before being seen by our physicians.       _____________________________________________________________  Should you have questions after your visit to [facility], please contact our office at [phone] and follow the prompts.  Our office hours are 8:00 a.m. and 4:30 p.m. Monday - Friday.  Please note that voicemails left after 4:00 p.m. may not be returned until the following business day.  We are closed weekends and major holidays.  You do have access to a nurse 24-7, just call the main number to the clinic [phone] and do not press any options, hold on the line and a nurse will answer the phone.    For prescription refill requests, have your pharmacy contact our office and allow 72 hours.    Due to  Covid, you will need to wear a mask upon entering the hospital. If you do not have a mask, a mask will be given to you at the Main Entrance upon arrival. For doctor visits, patients may have 1 support person age 103 or older with them. For treatment visits, patients can not have anyone with them due to social distancing guidelines and our immunocompromised population.

## 2024-03-11 NOTE — Assessment & Plan Note (Signed)
 Iron  deficiency anemia likely secondary to history of colon cancer. Currently anemia resolved.  Patient continues to have severe iron  deficiency  -We discussed some of the risks, benefits, and alternatives of intravenous iron  infusions. The patient is symptomatic from anemia and the iron  level is critically low. She tolerated oral iron  supplement poorly and desires to achieve higher levels of iron  faster for adequate hematopoesis. Some of the side-effects to be expected including risks of infusion reactions, phlebitis, headaches, nausea and fatigue.  The patient is willing to proceed. Patient education material was dispensed. Goal is to keep ferritin level greater than 50 and resolution of anemia -Start oral iron  every other day.  Use MiraLAX for constipation  Return to clinic in 8 weeks with labs to assess response to IV iron 

## 2024-03-18 ENCOUNTER — Inpatient Hospital Stay

## 2024-03-18 VITALS — BP 125/70 | HR 59 | Temp 97.4°F | Resp 18

## 2024-03-18 DIAGNOSIS — D500 Iron deficiency anemia secondary to blood loss (chronic): Secondary | ICD-10-CM

## 2024-03-18 DIAGNOSIS — D508 Other iron deficiency anemias: Secondary | ICD-10-CM | POA: Diagnosis not present

## 2024-03-18 MED ORDER — CETIRIZINE HCL 10 MG/ML IV SOLN
5.0000 mg | Freq: Once | INTRAVENOUS | Status: AC
  Administered 2024-03-18: 5 mg via INTRAVENOUS
  Filled 2024-03-18: qty 1

## 2024-03-18 MED ORDER — SODIUM CHLORIDE 0.9 % IV SOLN
510.0000 mg | Freq: Once | INTRAVENOUS | Status: AC
  Administered 2024-03-18: 510 mg via INTRAVENOUS
  Filled 2024-03-18: qty 510

## 2024-03-18 MED ORDER — ACETAMINOPHEN 325 MG PO TABS
650.0000 mg | ORAL_TABLET | Freq: Once | ORAL | Status: AC
  Administered 2024-03-18: 650 mg via ORAL
  Filled 2024-03-18: qty 2

## 2024-03-18 MED ORDER — SODIUM CHLORIDE 0.9 % IV SOLN: INTRAVENOUS | Status: DC

## 2024-03-18 NOTE — Patient Instructions (Signed)

## 2024-03-18 NOTE — Progress Notes (Signed)
 Patient tolerated iron infusion with no complaints voiced.  Peripheral IV site clean and dry with good blood return noted before and after infusion.  Band aid applied.  VSS with discharge and left in satisfactory condition with no s/s of distress noted.

## 2024-03-20 ENCOUNTER — Encounter: Payer: Self-pay | Admitting: Physician Assistant

## 2024-03-20 ENCOUNTER — Ambulatory Visit (INDEPENDENT_AMBULATORY_CARE_PROVIDER_SITE_OTHER): Admitting: Physician Assistant

## 2024-03-20 VITALS — BP 130/62 | HR 58 | Ht 68.0 in | Wt 144.8 lb

## 2024-03-20 DIAGNOSIS — D649 Anemia, unspecified: Secondary | ICD-10-CM | POA: Diagnosis not present

## 2024-03-20 DIAGNOSIS — E7800 Pure hypercholesterolemia, unspecified: Secondary | ICD-10-CM

## 2024-03-20 DIAGNOSIS — F03A0 Unspecified dementia, mild, without behavioral disturbance, psychotic disturbance, mood disturbance, and anxiety: Secondary | ICD-10-CM | POA: Diagnosis not present

## 2024-03-20 DIAGNOSIS — Z9049 Acquired absence of other specified parts of digestive tract: Secondary | ICD-10-CM

## 2024-03-20 DIAGNOSIS — Z85038 Personal history of other malignant neoplasm of large intestine: Secondary | ICD-10-CM

## 2024-03-20 DIAGNOSIS — R5381 Other malaise: Secondary | ICD-10-CM

## 2024-03-20 DIAGNOSIS — I10 Essential (primary) hypertension: Secondary | ICD-10-CM | POA: Diagnosis not present

## 2024-03-20 NOTE — Progress Notes (Signed)
 Established patient visit  Patient: Stacey Fitzgerald   DOB: 12/15/1940   83 y.o. Female  MRN: [id number] Visit Date: 03/20/2024  Today's healthcare provider: Jolynn Spencer, PA-C   Chief Complaint  Patient presents with   Hypertension   Subjective       Discussed the use of AI scribe software for clinical note transcription with the patient, who gave verbal consent to proceed.  History of Present Illness Stacey Fitzgerald is an 83 year old female who presents for follow-up after iron  infusions.  She feels improved after two recent iron  infusions, with the last one on Wednesday. Initial tiredness and grogginess post-infusion have resolved. She is scheduled for follow-up blood work at the cancer center on September 24th to assess iron  levels.  She has hypertension but does not regularly monitor her blood pressure at home. A recent reading was approximately 105/68 mmHg. She did not bring her home blood pressure cuff to the appointment.  She denies chest pain, shortness of breath, and vision issues. Bowel movements are regular every morning without pain.  Her memory is good, and she engages in activities like crossword puzzles and word searches. She has a family history of Alzheimer's disease. Socially, she is active, attending church and a women's group weekly, and enjoys dining out.       03/20/2024   10:13 AM 03/11/2024    9:27 AM 12/20/2023   10:34 AM  Depression screen PHQ 2/9  Decreased Interest 0 0 0  Down, Depressed, Hopeless 0 0 0  PHQ - 2 Score 0 0 0  Altered sleeping 0  0  Tired, decreased energy 1  0  Change in appetite 0  0  Feeling bad or failure about yourself  0  0  Trouble concentrating 0  0  Moving slowly or fidgety/restless 0  0  Suicidal thoughts 0  0  PHQ-9 Score 1  0  Difficult doing work/chores   Not difficult at all      03/20/2024   10:13 AM 12/20/2023   10:35 AM 11/08/2023   10:19 AM 10/03/2023    2:02 PM  GAD 7 : Generalized Anxiety Score   Nervous, Anxious, on Edge 0 0 0 0  Control/stop worrying 0 0 0 0  Worry too much - different things 0 0 0 0  Trouble relaxing 0 0 0 0  Restless 0 0 0 0  Easily annoyed or irritable 0 0 0 0  Afraid - awful might happen 0 0 0 0  Total GAD 7 Score 0 0 0 0  Anxiety Difficulty  Not difficult at all Not difficult at all     Medications: Outpatient Medications Prior to Visit  Medication Sig   acetaminophen  (TYLENOL ) 500 MG tablet Take 500 mg by mouth every 6 (six) hours as needed.   ferrous sulfate  325 (65 FE) MG tablet Take 1 tablet (325 mg total) by mouth every other day.   lisinopril  (ZESTRIL ) 20 MG tablet Take 1 tablet (20 mg total) by mouth 2 (two) times daily.   simvastatin  (ZOCOR ) 20 MG tablet Take 1 tablet (20 mg total) by mouth at bedtime.   No facility-administered medications prior to visit.    Review of Systems  All other systems reviewed and are negative.  All negative Except see HPI       Objective    BP 130/62 (BP Location: Left Arm, Patient Position: Sitting, Cuff Size: Normal)   Pulse (!) 58   Ht 5' 8 (1.727 m)  Wt 144 lb 12.8 oz (65.7 kg)   SpO2 100%   BMI 22.02 kg/m     Physical Exam Vitals reviewed.  Constitutional:      General: She is not in acute distress.    Appearance: Normal appearance. She is well-developed. She is not diaphoretic.  HENT:     Head: Normocephalic and atraumatic.  Eyes:     General: No scleral icterus.    Conjunctiva/sclera: Conjunctivae normal.  Neck:     Thyroid : No thyromegaly.  Cardiovascular:     Rate and Rhythm: Normal rate and regular rhythm.     Pulses: Normal pulses.     Heart sounds: Normal heart sounds. No murmur heard. Pulmonary:     Effort: Pulmonary effort is normal. No respiratory distress.     Breath sounds: Normal breath sounds. No wheezing, rhonchi or rales.  Musculoskeletal:     Cervical back: Neck supple.     Right lower leg: No edema.     Left lower leg: No edema.  Lymphadenopathy:      Cervical: No cervical adenopathy.  Skin:    General: Skin is warm and dry.     Findings: No rash.  Neurological:     Mental Status: She is alert and oriented to person, place, and time. Mental status is at baseline.  Psychiatric:        Mood and Affect: Mood normal.        Behavior: Behavior normal.      No results found for any visits on 03/20/24.      Assessment & Plan Essential hypertension Chronic and unstable Home cuff accuracy uncertain. - Bring home blood pressure cuff to next appointment for verification. Continue lifestyle modifications Continue lisinopril  20mg  bid daily Will follow-up  Anemia status post recent iron  infusions Chronic Completed two iron  infusions. Reports improvement post-infusion. Follow-up blood work scheduled. - Attend follow-up blood work appointment at the cancer center on September 24 to assess iron  levels. Will follow-up  Mild dementia without behavioral disturbance Chronic Memory stable. Engages in cognitive activities. Will follow-up  Physical debility  Improved after PT Will monitor  Pure hypercholesterolemia Chronic and stable Continue lifestyle modifications Continue simvastatin  20mg  Will follow-up  S/P partial resection of colon History of colon cancer Managed by medical oncology  No orders of the defined types were placed in this encounter.   No follow-ups on file.   The patient was advised to call back or seek an in-person evaluation if the symptoms worsen or if the condition fails to improve as anticipated.  I discussed the assessment and treatment plan with the patient. The patient was provided an opportunity to ask questions and all were answered. The patient agreed with the plan and demonstrated an understanding of the instructions.  I, Lahna Nath, PA-C have reviewed all documentation for this visit. The documentation on 03/20/2024  for the exam, diagnosis, procedures, and orders are all accurate and  complete.  Jolynn Spencer, [facility], MMS [facility] [phone] (phone) [phone] (fax)  [facility]

## 2024-03-24 ENCOUNTER — Other Ambulatory Visit: Payer: Self-pay | Admitting: Physician Assistant

## 2024-03-24 DIAGNOSIS — I10 Essential (primary) hypertension: Secondary | ICD-10-CM

## 2024-04-07 ENCOUNTER — Other Ambulatory Visit: Payer: Self-pay | Admitting: *Deleted

## 2024-04-15 ENCOUNTER — Inpatient Hospital Stay: Attending: Hematology

## 2024-04-15 ENCOUNTER — Ambulatory Visit (HOSPITAL_COMMUNITY)
Admission: RE | Admit: 2024-04-15 | Discharge: 2024-04-15 | Disposition: A | Discharge status: Home / Self Care | Source: Ambulatory Visit | Attending: Oncology | Admitting: Oncology

## 2024-04-15 DIAGNOSIS — Z79899 Other long term (current) drug therapy: Secondary | ICD-10-CM | POA: Insufficient documentation

## 2024-04-15 DIAGNOSIS — D508 Other iron deficiency anemias: Secondary | ICD-10-CM | POA: Insufficient documentation

## 2024-04-15 DIAGNOSIS — C182 Malignant neoplasm of ascending colon: Secondary | ICD-10-CM | POA: Insufficient documentation

## 2024-04-15 DIAGNOSIS — R911 Solitary pulmonary nodule: Secondary | ICD-10-CM | POA: Insufficient documentation

## 2024-04-15 LAB — CBC WITH DIFFERENTIAL/PLATELET
Abs Immature Granulocytes: 0.04 K/uL (ref 0.00–0.07)
Basophils Absolute: 0.1 K/uL (ref 0.0–0.1)
Basophils Relative: 1 %
Eosinophils Absolute: 0.2 K/uL (ref 0.0–0.5)
Eosinophils Relative: 2 %
HCT: 39.2 % (ref 36.0–46.0)
Hemoglobin: 12.7 g/dL (ref 12.0–15.0)
Immature Granulocytes: 0 %
Lymphocytes Relative: 18 %
Lymphs Abs: 1.8 K/uL (ref 0.7–4.0)
MCH: 28.9 pg (ref 26.0–34.0)
MCHC: 32.4 g/dL (ref 30.0–36.0)
MCV: 89.3 fL (ref 80.0–100.0)
Monocytes Absolute: 0.8 K/uL (ref 0.1–1.0)
Monocytes Relative: 8 %
Neutro Abs: 7.2 K/uL (ref 1.7–7.7)
Neutrophils Relative %: 71 %
Platelets: 289 K/uL (ref 150–400)
RBC: 4.39 MIL/uL (ref 3.87–5.11)
RDW: 14.6 % (ref 11.5–15.5)
WBC: 10.1 K/uL (ref 4.0–10.5)
nRBC: 0 % (ref 0.0–0.2)

## 2024-04-15 LAB — COMPREHENSIVE METABOLIC PANEL WITH GFR
ALT: 13 U/L (ref 0–44)
AST: <10 U/L — ABNORMAL LOW (ref 15–41)
Albumin: 4 g/dL (ref 3.5–5.0)
Alkaline Phosphatase: 61 U/L (ref 38–126)
Anion gap: 9 (ref 5–15)
BUN: 21 mg/dL (ref 8–23)
CO2: 25 mmol/L (ref 22–32)
Calcium: 9.3 mg/dL (ref 8.9–10.3)
Chloride: 107 mmol/L (ref 98–111)
Creatinine, Ser: 1.05 mg/dL — ABNORMAL HIGH (ref 0.44–1.00)
GFR, Estimated: 53 mL/min — ABNORMAL LOW (ref >60)
Glucose, Bld: 79 mg/dL (ref 70–99)
Potassium: 4.1 mmol/L (ref 3.5–5.1)
Sodium: 141 mmol/L (ref 135–145)
Total Bilirubin: 0.7 mg/dL (ref 0.0–1.2)
Total Protein: 7.1 g/dL (ref 6.5–8.1)

## 2024-04-15 LAB — IRON AND TIBC
Iron: 85 ug/dL (ref 28–170)
Saturation Ratios: 31 % (ref 10.4–31.8)
TIBC: 278 ug/dL (ref 250–450)
UIBC: 193 ug/dL

## 2024-04-15 LAB — VITAMIN B12: Vitamin B-12: 186 pg/mL (ref 180–914)

## 2024-04-15 LAB — FERRITIN: Ferritin: 245 ng/mL (ref 11–307)

## 2024-04-15 LAB — FOLATE: Folate: 17 ng/mL (ref >5.9)

## 2024-04-15 MED ORDER — IOHEXOL 9 MG/ML PO SOLN
500.0000 mL | ORAL | Status: AC
  Administered 2024-04-15: 500 mL via ORAL

## 2024-04-15 MED ORDER — IOHEXOL 300 MG/ML  SOLN
100.0000 mL | Freq: Once | INTRAMUSCULAR | Status: AC | PRN
  Administered 2024-04-15: 100 mL via INTRAVENOUS

## 2024-04-16 LAB — CEA: CEA: 1.8 ng/mL (ref 0.0–4.7)

## 2024-04-22 ENCOUNTER — Inpatient Hospital Stay: Attending: Hematology | Admitting: Oncology

## 2024-04-22 VITALS — BP 129/64 | HR 65 | Temp 97.7°F | Resp 20 | Wt 147.5 lb

## 2024-04-22 DIAGNOSIS — D500 Iron deficiency anemia secondary to blood loss (chronic): Secondary | ICD-10-CM

## 2024-04-22 DIAGNOSIS — Z9049 Acquired absence of other specified parts of digestive tract: Secondary | ICD-10-CM | POA: Diagnosis not present

## 2024-04-22 DIAGNOSIS — Z85038 Personal history of other malignant neoplasm of large intestine: Secondary | ICD-10-CM | POA: Diagnosis not present

## 2024-04-22 DIAGNOSIS — C182 Malignant neoplasm of ascending colon: Secondary | ICD-10-CM | POA: Diagnosis not present

## 2024-04-22 DIAGNOSIS — R911 Solitary pulmonary nodule: Secondary | ICD-10-CM | POA: Diagnosis not present

## 2024-04-22 DIAGNOSIS — E538 Deficiency of other specified B group vitamins: Secondary | ICD-10-CM | POA: Diagnosis not present

## 2024-04-22 DIAGNOSIS — D509 Iron deficiency anemia, unspecified: Secondary | ICD-10-CM | POA: Insufficient documentation

## 2024-04-22 DIAGNOSIS — Z79899 Other long term (current) drug therapy: Secondary | ICD-10-CM | POA: Insufficient documentation

## 2024-04-22 NOTE — Progress Notes (Signed)
 Patient Care Team: Ostwalt, [name], PA-C as PCP - General (Physician Assistant) [name], RN as Oncology Nurse Navigator (Medical Oncology)  Clinic Day:  04/22/2024  Referring physician: Ostwalt, [name], PA-C   CHIEF COMPLAINT:  CC: Stage II (T3 N0 M0) ascending colon adenocarcinoma    ASSESSMENT & PLAN:   Assessment & Plan: Stacey Fitzgerald  is a 83 y.o. female with Stage II (T3 N0 M0) ascending colon adenocarcinoma   Assessment and Plan Assessment & Plan Colon cancer, ascending  Stage II (T3N0M0) ascending colon adenocarcinoma s/p hemicolectomy (10/11/2023).  MSI-high.  Loss of MLH1 and PMS2 BRAF V600 E mutation negative, MLH1-methylation positive.  Sporadic nature. CEA was normal.  Signatera test was negative.   - Patient has no indication for adjuvant chemotherapy - We reviewed the recent CT scan together with no evidence of disease.  Will repeat in 6 months - Surveillance per NCCN guidelines: - H&E every 3 to 6 months for 2 years, then every 6 months for a total of 5 years - CEA every 3 to 6 months for 2 years, then every 6 months for a total of 5 years - CT CAP every 6 to 12 months from date of surgery for a total of 5 years  -Patient will need a colonoscopy at 1 year mark. Due 09/2024 -Will repeat Signatera every 6 months for 2 years.   Return to clinic in 3 months with labs  Iron  deficiency anemia, status post IV iron , on oral iron  Iron  deficiency anemia previously treated with IV iron , resulting in significant improvement in iron  levels. Saturation ratio improved from 6 to 31. Reports diarrhea as a side effect of oral iron . - Continue oral iron  supplementation every other day.  Vitamin B12 deficiency Vitamin B12 deficiency likely secondary to increased utilization following correction of iron  deficiency.  Vitamin B12 less than 400 - Start over-the-counter vitamin B12 supplementation, 1000 mcg daily.  Right upper lobe part solid nodule Staging CT chest  showed solid nodule in the right upper lobe Repeat CT did not show this  -Will continue to monitor   The patient understands the plans discussed today and is in agreement with them.  She knows to contact our office if she develops concerns prior to her next appointment.  30 minutes of total time was spent for this patient encounter, including preparation,face-to-face counseling with the patient and coordination of care, physical exam, and documentation of the encounter.   [name], MD  [facility] [place] - A DEPT OF [name] [address]  [place] [address] Dept: [phone] Dept Fax: [phone]   No orders of the defined types were placed in this encounter.    ONCOLOGY HISTORY:   Stage II (T3 N0 M0) ascending colon adenocarcinoma:   -Presentation: Severe anemia with hemoglobin 6.4, s/p 2 units PRBC -10/09/2023: Colonoscopy : In the proximal ascending colon, there is a spiral shaped semilunar/semiapple core neoplasm approximately 10 to 15 cm distal to the ileocecal valve.  Diverticulosis in the sigmoid colon.  Nonbleeding internal hemorrhoids.  Biopsy consistent with invasive moderately differentiated adenocarcinoma. -10/10/2023 CTAP : Focal lobular mass along the right side of the colon just above the ileocecal bowel.  Few borderline enlarged lymph nodes are seen in the medially adjacent mesentery in the right lower quadrant.  Tiny segment 8 dome liver lesion identified too small to characterize. -10/11/2023:  Right hemicolectomy by Dr. Kallie - Pathology: Invasive moderately differentiated mucinous adenocarcinoma,  invading through muscularis propria, tumor arises within a tubular adenoma with high-grade dysplasia, margins free, 0/38 lymph nodes involved, pT3 pN0.   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 - MSI-high.  BRAF V600 mutation negative.  MLH1 hyper methylation positive.  This indicates sporadic  nature. -11/01/2023 CT chest: Part solid nodule within the anterolateral right upper lobe measuring 2 cm with 5 mm solid component. -10/31/2023 MRI liver: Tiny benign nonenhancing fluid signal cyst in the central posterior right lobe of the liver, hepatic segment 7/8. -11/15/2023: Signatera: Negative -03/08/2024: Signatera: Negative -04/15/2024:CT AP: Interval right hemicolectomy and ileocolonic anastomosis. No evidence of local recurrence or metastatic disease.  -04/15/2024: CEA:1.82   Current Treatment:  Surveillance  INTERVAL HISTORY:    Discussed the use of AI scribe software for clinical note transcription with the patient, who gave verbal consent to proceed.  History of Present Illness Stacey Fitzgerald is an 83 year old female with iron  deficiency anemia and colon cancer who presents for follow-up after IV iron  treatment.  She is accompanied by her son today  She feels better following the IV iron  treatment, although she is unsure if her energy levels have significantly improved. Her iron  saturation ratio was 6 before the IV iron  infusion and is 31 today. She continues to take oral iron  supplements every other day, which causes diarrhea rather than constipation. No melena, and she reports a good appetite with regular bowel movements in the morning.  Her vitamin B12 levels are low. She has a history of colon cancer, with recent CT scans showing no evidence of recurrence. Her cancer marker, CEA, was normal on recent testing. She underwent surgery in the past, and her follow-up includes regular monitoring with CT scans and blood work.  No constipation, and she reports diarrhea with iron  supplementation. She confirms a good appetite and regular bowel movements without melena.   I have reviewed the past medical history, past surgical history, social history and family history with the patient and they are unchanged from previous note.  ALLERGIES:  is allergic to  cephalexin.  MEDICATIONS:  Current Outpatient Medications  Medication Sig Dispense Refill   acetaminophen  (TYLENOL ) 500 MG tablet Take 500 mg by mouth every 6 (six) hours as needed.     ferrous sulfate  325 (65 FE) MG tablet Take 1 tablet (325 mg total) by mouth every other day. 60 tablet 0   lisinopril  (ZESTRIL ) 20 MG tablet TAKE 1 TABLET BY MOUTH TWICE A DAY 180 tablet 1   simvastatin  (ZOCOR ) 20 MG tablet Take 1 tablet (20 mg total) by mouth at bedtime. 30 tablet 0   No current facility-administered medications for this visit.    REVIEW OF SYSTEMS:   Constitutional: Denies fevers, chills or abnormal weight loss Eyes: Denies blurriness of vision Ears, nose, mouth, throat, and face: Denies mucositis or sore throat Respiratory: Denies cough, dyspnea or wheezes Cardiovascular: Denies palpitation, chest discomfort or lower extremity swelling Gastrointestinal:  Denies nausea, heartburn or change in bowel habits Skin: Denies abnormal skin rashes Lymphatics: Denies new lymphadenopathy or easy bruising Neurological:Denies numbness, tingling or new weaknesses Behavioral/Psych: Mood is stable, no new changes  All other systems were reviewed with the patient and are negative.   VITALS:  Blood pressure 129/64, pulse 65, temperature 97.7 F (36.5 C), temperature source Oral, resp. rate 20, weight 147 lb 7.8 oz (66.9 kg), SpO2 100%.  Wt Readings from Last 3 Encounters:  04/22/24 147 lb 7.8 oz (66.9 kg)  03/20/24 144 lb 12.8  oz (65.7 kg)  03/11/24 146 lb (66.2 kg)    Body mass index is 22.43 kg/m.  Performance status (ECOG): 2 - Symptomatic, <50% confined to bed  PHYSICAL EXAM:   GENERAL:alert, no distress and comfortable SKIN: skin color, texture, turgor are normal, no rashes or significant lesions LYMPH:  no palpable lymphadenopathy in the cervical, axillary or inguinal LUNGS: clear to auscultation and percussion with normal breathing effort HEART: regular rate & rhythm and no  murmurs and no lower extremity edema ABDOMEN:abdomen soft, non-tender and normal bowel sounds Musculoskeletal:no cyanosis of digits and no clubbing  NEURO: alert & oriented x 3 with fluent speech, no focal motor/sensory deficits  LABORATORY DATA:  I have reviewed the data as listed     Component Value Date/Time   NA 141 04/15/2024 0837   NA 140 10/04/2023 0835   K 4.1 04/15/2024 0837   CL 107 04/15/2024 0837   CO2 25 04/15/2024 0837   GLUCOSE 79 04/15/2024 0837   BUN 21 04/15/2024 0837   BUN 23 10/04/2023 0835   CREATININE 1.05 (H) 04/15/2024 0837   CREATININE 0.87 05/31/2017 0814   CALCIUM  9.3 04/15/2024 0837   PROT 7.1 04/15/2024 0837   PROT 6.4 10/04/2023 0835   ALBUMIN 4.0 04/15/2024 0837   ALBUMIN 4.2 10/04/2023 0835   AST <10 (L) 04/15/2024 0837   ALT 13 04/15/2024 0837   ALKPHOS 61 04/15/2024 0837   BILITOT 0.7 04/15/2024 0837   BILITOT 0.2 10/04/2023 0835   GFRNONAA 53 (L) 04/15/2024 0837   GFRNONAA 65 05/31/2017 0814   GFRAA 53 (L) 05/31/2020 0937   GFRAA 76 05/31/2017 0814     Lab Results  Component Value Date   WBC 10.1 04/15/2024   NEUTROABS 7.2 04/15/2024   HGB 12.7 04/15/2024   HCT 39.2 04/15/2024   MCV 89.3 04/15/2024   PLT 289 04/15/2024      Chemistry      Component Value Date/Time   NA 141 04/15/2024 0837   NA 140 10/04/2023 0835   K 4.1 04/15/2024 0837   CL 107 04/15/2024 0837   CO2 25 04/15/2024 0837   BUN 21 04/15/2024 0837   BUN 23 10/04/2023 0835   CREATININE 1.05 (H) 04/15/2024 0837   CREATININE 0.87 05/31/2017 0814      Component Value Date/Time   CALCIUM  9.3 04/15/2024 0837   ALKPHOS 61 04/15/2024 0837   AST <10 (L) 04/15/2024 0837   ALT 13 04/15/2024 0837   BILITOT 0.7 04/15/2024 0837   BILITOT 0.2 10/04/2023 0835        Latest Reference Range & Units 04/15/24 08:37  Iron  28 - 170 ug/dL 85  UIBC ug/dL 806  TIBC 749 - 549 ug/dL 721  Saturation Ratios 10.4 - 31.8 % 31  Ferritin 11 - 307 ng/mL 245  Folate >5.9 ng/mL  17.0  Vitamin B12 180 - 914 pg/mL 186    Latest Reference Range & Units 04/15/24 08:37  CEA 0.0 - 4.7 ng/mL 1.8   RADIOGRAPHIC STUDIES: I have personally reviewed the radiological images as listed and agreed with the findings in the report.  CT ABDOMEN PELVIS W CONTRAST CLINICAL DATA:  Colon cancer, stage II/III, monitor  * Tracking Code: BO *  EXAM: CT ABDOMEN AND PELVIS WITH CONTRAST  TECHNIQUE: Multidetector CT imaging of the abdomen and pelvis was performed using the standard protocol following bolus administration of intravenous contrast.  RADIATION DOSE REDUCTION: This exam was performed according to the departmental dose-optimization program which includes automated  exposure control, adjustment of the mA and/or kV according to patient size and/or use of iterative reconstruction technique.  CONTRAST:  OMNIPAQUE  IOHEXOL  300 MG/ML  SOLN  COMPARISON:  Abdominopelvic CT 10/10/2023, abdominal MRI 10/31/2023 and chest CT 11/01/2023.  FINDINGS: Lower chest: Clear lung bases. No significant pleural or pericardial effusion. Atherosclerosis of the aorta and coronary arteries with calcifications of the aortic valve. Moderate size hiatal hernia.  Hepatobiliary: The liver is normal in density without suspicious focal abnormality. Stable tiny cyst in the dome of the right hepatic lobe (image 13/2). No evidence of gallstones, gallbladder wall thickening or biliary dilatation.  Pancreas: Unremarkable. No pancreatic ductal dilatation or surrounding inflammatory changes.  Spleen: Normal in size without suspicious focal abnormality.  Adrenals/Urinary Tract: Both adrenal glands appear normal. No evidence of urinary tract calculus, suspicious renal lesion or hydronephrosis. Stable renal cortical and parapelvic cysts for which no specific follow-up imaging is recommended. The bladder appears unremarkable for its degree of distention.  Stomach/Bowel: Enteric contrast has  passed into the rectum. As above, moderate-size hiatal hernia. The stomach otherwise appears unremarkable for its degree of distention. Postsurgical changes from right hemicolectomy and ileocolonic anastomosis. No evidence of bowel distension, wall thickening or surrounding inflammation. Stable duodenal diverticulum.  Vascular/Lymphatic: There are no enlarged abdominal or pelvic lymph nodes. Ill-defined mixed soft tissue and fat density anterior to the right iliac bone on image 47/2 has an appearance most consistent with focal fat necrosis. Aortic and branch vessel atherosclerosis without evidence of aneurysm or large vessel occlusion.  Reproductive: Unchanged cystic right adnexal lesion measuring 2.6 cm on image 69/2. There is an adjacent curvilinear calcification, although this finding otherwise appears simple. No suspicious adnexal findings. Vaginal pessary in place.  Other: Postsurgical changes as described with suspected fat necrosis in the right mid abdomen. No ascites, peritoneal nodularity or pneumoperitoneum.  Musculoskeletal: No acute or significant osseous findings. Multilevel spondylosis associated with a convex left thoracolumbar scoliosis. 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 Asymmetric atrophy of the right gluteus musculature.  IMPRESSION: 1. Interval right hemicolectomy and ileocolonic anastomosis. 2. No evidence of local recurrence or metastatic disease. Probable small focus of fat necrosis in the right retroperitoneum. 3. Stable cystic right adnexal lesion, likely benign. 4. Moderate-size hiatal hernia. 5.  Aortic Atherosclerosis (ICD10-I70.0).  Electronically Signed   By: Elsie Perone M.D.   On: 04/20/2024 09:14

## 2024-04-28 ENCOUNTER — Emergency Department (HOSPITAL_COMMUNITY)

## 2024-04-28 ENCOUNTER — Other Ambulatory Visit: Payer: Self-pay

## 2024-04-28 ENCOUNTER — Inpatient Hospital Stay (HOSPITAL_COMMUNITY)
Admission: EM | Admit: 2024-04-28 | Discharge: 2024-05-01 | DRG: 522 | Disposition: A | Discharge status: Skilled Nursing Facility | Attending: Internal Medicine | Admitting: Internal Medicine

## 2024-04-28 ENCOUNTER — Encounter (HOSPITAL_COMMUNITY): Payer: Self-pay

## 2024-04-28 DIAGNOSIS — Z79899 Other long term (current) drug therapy: Secondary | ICD-10-CM

## 2024-04-28 DIAGNOSIS — E785 Hyperlipidemia, unspecified: Secondary | ICD-10-CM | POA: Diagnosis present

## 2024-04-28 DIAGNOSIS — S72011A Unspecified intracapsular fracture of right femur, initial encounter for closed fracture: Secondary | ICD-10-CM | POA: Diagnosis present

## 2024-04-28 DIAGNOSIS — E039 Hypothyroidism, unspecified: Secondary | ICD-10-CM | POA: Diagnosis present

## 2024-04-28 DIAGNOSIS — Z881 Allergy status to other antibiotic agents status: Secondary | ICD-10-CM

## 2024-04-28 DIAGNOSIS — E782 Mixed hyperlipidemia: Secondary | ICD-10-CM | POA: Diagnosis not present

## 2024-04-28 DIAGNOSIS — S72001A Fracture of unspecified part of neck of right femur, initial encounter for closed fracture: Principal | ICD-10-CM | POA: Diagnosis present

## 2024-04-28 DIAGNOSIS — D509 Iron deficiency anemia, unspecified: Secondary | ICD-10-CM | POA: Diagnosis present

## 2024-04-28 DIAGNOSIS — Z90710 Acquired absence of both cervix and uterus: Secondary | ICD-10-CM | POA: Diagnosis not present

## 2024-04-28 DIAGNOSIS — M1611 Unilateral primary osteoarthritis, right hip: Secondary | ICD-10-CM | POA: Diagnosis present

## 2024-04-28 DIAGNOSIS — Z780 Asymptomatic menopausal state: Secondary | ICD-10-CM

## 2024-04-28 DIAGNOSIS — S72009A Fracture of unspecified part of neck of unspecified femur, initial encounter for closed fracture: Secondary | ICD-10-CM | POA: Diagnosis not present

## 2024-04-28 DIAGNOSIS — I10 Essential (primary) hypertension: Secondary | ICD-10-CM | POA: Diagnosis present

## 2024-04-28 DIAGNOSIS — Z9049 Acquired absence of other specified parts of digestive tract: Secondary | ICD-10-CM | POA: Diagnosis not present

## 2024-04-28 DIAGNOSIS — Z833 Family history of diabetes mellitus: Secondary | ICD-10-CM

## 2024-04-28 DIAGNOSIS — M25551 Pain in right hip: Secondary | ICD-10-CM | POA: Diagnosis present

## 2024-04-28 DIAGNOSIS — E7800 Pure hypercholesterolemia, unspecified: Secondary | ICD-10-CM | POA: Diagnosis not present

## 2024-04-28 DIAGNOSIS — Y92009 Unspecified place in unspecified non-institutional (private) residence as the place of occurrence of the external cause: Secondary | ICD-10-CM | POA: Diagnosis not present

## 2024-04-28 DIAGNOSIS — W010XXA Fall on same level from slipping, tripping and stumbling without subsequent striking against object, initial encounter: Secondary | ICD-10-CM | POA: Diagnosis present

## 2024-04-28 DIAGNOSIS — W19XXXA Unspecified fall, initial encounter: Secondary | ICD-10-CM | POA: Diagnosis not present

## 2024-04-28 LAB — CBC WITH DIFFERENTIAL/PLATELET
Abs Immature Granulocytes: 0.06 K/uL (ref 0.00–0.07)
Basophils Absolute: 0 K/uL (ref 0.0–0.1)
Basophils Relative: 0 %
Eosinophils Absolute: 0 K/uL (ref 0.0–0.5)
Eosinophils Relative: 0 %
HCT: 37.1 % (ref 36.0–46.0)
Hemoglobin: 12.2 g/dL (ref 12.0–15.0)
Immature Granulocytes: 1 %
Lymphocytes Relative: 12 %
Lymphs Abs: 1.6 K/uL (ref 0.7–4.0)
MCH: 29.3 pg (ref 26.0–34.0)
MCHC: 32.9 g/dL (ref 30.0–36.0)
MCV: 89 fL (ref 80.0–100.0)
Monocytes Absolute: 1.2 K/uL — ABNORMAL HIGH (ref 0.1–1.0)
Monocytes Relative: 9 %
Neutro Abs: 9.9 K/uL — ABNORMAL HIGH (ref 1.7–7.7)
Neutrophils Relative %: 78 %
Platelets: 350 K/uL (ref 150–400)
RBC: 4.17 MIL/uL (ref 3.87–5.11)
RDW: 14.5 % (ref 11.5–15.5)
WBC: 12.8 K/uL — ABNORMAL HIGH (ref 4.0–10.5)
nRBC: 0 % (ref 0.0–0.2)

## 2024-04-28 LAB — BASIC METABOLIC PANEL WITH GFR
Anion gap: 15 (ref 5–15)
BUN: 20 mg/dL (ref 8–23)
CO2: 21 mmol/L — ABNORMAL LOW (ref 22–32)
Calcium: 10.1 mg/dL (ref 8.9–10.3)
Chloride: 103 mmol/L (ref 98–111)
Creatinine, Ser: 1.02 mg/dL — ABNORMAL HIGH (ref 0.44–1.00)
GFR, Estimated: 55 mL/min — ABNORMAL LOW (ref >60)
Glucose, Bld: 101 mg/dL — ABNORMAL HIGH (ref 70–99)
Potassium: 4.2 mmol/L (ref 3.5–5.1)
Sodium: 139 mmol/L (ref 135–145)

## 2024-04-28 LAB — PROTIME-INR
INR: 1 (ref 0.8–1.2)
Prothrombin Time: 13.9 s (ref 11.4–15.2)

## 2024-04-28 LAB — TSH: TSH: 3.25 u[IU]/mL (ref 0.350–4.500)

## 2024-04-28 LAB — MAGNESIUM: Magnesium: 2.1 mg/dL (ref 1.7–2.4)

## 2024-04-28 MED ORDER — LISINOPRIL 10 MG PO TABS
20.0000 mg | ORAL_TABLET | Freq: Two times a day (BID) | ORAL | Status: DC
  Administered 2024-04-28 – 2024-05-01 (×6): 20 mg via ORAL
  Filled 2024-04-28 (×6): qty 2

## 2024-04-28 MED ORDER — ONDANSETRON HCL 4 MG/2ML IJ SOLN
4.0000 mg | Freq: Four times a day (QID) | INTRAMUSCULAR | Status: DC | PRN
  Administered 2024-04-28 (×2): 4 mg via INTRAVENOUS
  Filled 2024-04-28 (×2): qty 2

## 2024-04-28 MED ORDER — SIMVASTATIN 20 MG PO TABS
20.0000 mg | ORAL_TABLET | Freq: Every day | ORAL | Status: DC
  Administered 2024-04-28 – 2024-04-30 (×3): 20 mg via ORAL
  Filled 2024-04-28 (×3): qty 1

## 2024-04-28 MED ORDER — MORPHINE SULFATE (PF) 2 MG/ML IV SOLN
2.0000 mg | INTRAVENOUS | Status: DC | PRN
  Administered 2024-04-28: 2 mg via INTRAVENOUS
  Filled 2024-04-28 (×2): qty 1

## 2024-04-28 MED ORDER — FENTANYL CITRATE (PF) 100 MCG/2ML IJ SOLN
50.0000 ug | INTRAMUSCULAR | Status: AC | PRN
  Administered 2024-04-28 (×2): 50 ug via INTRAVENOUS
  Filled 2024-04-28 (×2): qty 2

## 2024-04-28 NOTE — ED Provider Notes (Signed)
 [place] EMERGENCY DEPARTMENT AT [facility] Provider Note   CSN: [id number] Arrival date & time: 04/28/24  1245     Patient presents with: Hip Pain   Stacey Fitzgerald is a 83 y.o. female.    Hip Pain  Patient presents for right hip pain.  Medical history includes HTN, arthritis, HLD, anemia.  She is not on a blood thinner.  1 week ago, she had onset of right hip pain after she took Fitzgerald abnormal step.  Despite this, she has been ambulatory although with antalgic gait.  Today, she woke up with worsened right hip pain.  She has not been able to ambulate today.  Neighbors were called to help her out of the bed and onto a couch.  Current pain is 5/10 in severity.  She denies any other current or recent symptoms.     Prior to Admission medications   Medication Sig Start Date End Date Taking? Authorizing Provider  acetaminophen  (TYLENOL ) 500 MG tablet Take 500 mg by mouth every 6 (six) hours as needed.    [provider]  ferrous sulfate  325 (65 FE) MG tablet Take 1 tablet (325 mg total) by mouth every other day. 03/11/24   Kandala, Hyndavi, MD  lisinopril  (ZESTRIL ) 20 MG tablet TAKE 1 TABLET BY MOUTH TWICE A DAY 03/24/24   Ostwalt, [name], PA-C  simvastatin  (ZOCOR ) 20 MG tablet Take 1 tablet (20 mg total) by mouth at bedtime. 10/28/23   Landy Barnie RAMAN, NP    Allergies: Cephalexin    Review of Systems  Musculoskeletal:  Positive for arthralgias.  All other systems reviewed and are negative.   Updated Vital Signs BP (!) 148/84 (BP Location: Left Arm)   Pulse 89   Temp (!) 97.5 F (36.4 C) (Oral)   Resp 18   SpO2 99%   Physical Exam Vitals and nursing note reviewed.  Constitutional:      General: She is not in acute distress.    Appearance: Normal appearance. She is well-developed. She is not ill-appearing, toxic-appearing or diaphoretic.  HENT:     Head: Normocephalic and atraumatic.     Right Ear: External ear normal.     Left Ear: External ear normal.      Nose: Nose normal.     Mouth/Throat:     Mouth: Mucous membranes are moist.  Eyes:     Extraocular Movements: Extraocular movements intact.     Conjunctiva/sclera: Conjunctivae normal.  Cardiovascular:     Rate and Rhythm: Normal rate and regular rhythm.  Pulmonary:     Effort: Pulmonary effort is normal. No respiratory distress.  Abdominal:     General: There is no distension.     Palpations: Abdomen is soft.     Tenderness: There is no abdominal tenderness.  Musculoskeletal:        General: Deformity present. No swelling.     Cervical back: Normal range of motion and neck supple.  Skin:    General: Skin is warm and dry.     Coloration: Skin is not jaundiced or pale.  Neurological:     General: No focal deficit present.     Mental Status: She is alert and oriented to person, place, and time.     Cranial Nerves: No cranial nerve deficit.     Sensory: No sensory deficit.     Motor: No weakness.     Coordination: Coordination normal.  Psychiatric:        Mood and Affect: Mood normal.  Behavior: Behavior normal.     (all labs ordered are listed, but only abnormal results are displayed) Labs Reviewed  BASIC METABOLIC PANEL WITH GFR - Abnormal; Notable for the following components:      Result Value   CO2 21 (*)    Glucose, Bld 101 (*)    Creatinine, Ser 1.02 (*)    GFR, Estimated 55 (*)    All other components within normal limits  CBC WITH DIFFERENTIAL/PLATELET - Abnormal; Notable for the following components:   WBC 12.8 (*)    Neutro Abs 9.9 (*)    Monocytes Absolute 1.2 (*)    All other components within normal limits  PROTIME-INR  MAGNESIUM    EKG: None  Radiology: [facility] Chest Port 1 View Result Date: 04/28/2024 CLINICAL DATA:  Fall. EXAM: PORTABLE CHEST 1 VIEW COMPARISON:  10/10/2023 FINDINGS: Shallow inspiration. Heart size and pulmonary vascularity are normal for technique. Small right pleural effusion or thickening with atelectasis in the right  base. This is similar to previous study. Left lung is clear. No pneumothorax. Mediastinal contours appear intact. Probable nondisplaced fracture of the anterior right fifth rib. Degenerative changes in the shoulders. IMPRESSION: Shallow inspiration. Fluid or thickened pleura in the right costophrenic angle with atelectasis in the right base. Probable fracture of the anterior right fifth rib. Electronically Signed   By: Elsie Gravely M.D.   On: 04/28/2024 15:05   DG Hip Unilat W or Wo Pelvis 2-3 Views Right Result Date: 04/28/2024 EXAM: 2 or 3 VIEW(S) XRAY OF THE RIGHT HIP 04/28/2024 01:54:14 PM COMPARISON: None available. CLINICAL HISTORY: fall, pain. Pt brought in by [facility]. Reports right hip pain that began 04/22/24 after she stepped off of a step in a precarious manner. Pt was attempting to use the bathroom today and was unable to stand back up, as the pain became much worse. Shortening and rotation is noted of the right leg. CMS intact. Pt is AOx4. She denies any other complaints of pain. [facility] administered 4mg  of Morphine PTA. FINDINGS: BONES AND JOINTS: Fracture through the right femoral neck subcapital fracture. There is varus angulation of the femoral head. No dislocation. The hip joint is maintained. No significant degenerative changes. SOFT TISSUES: The soft tissues are unremarkable. IMPRESSION: 1. Right femoral neck subcapital fracture with varus angulation. No dislocation. Electronically signed by: Norleen Boxer MD 04/28/2024 02:36 PM EDT RP Workstation: [id number]     Procedures   Medications Ordered in the ED  fentaNYL  (SUBLIMAZE ) injection 50 mcg (50 mcg Intravenous Given 04/28/24 1445)  ondansetron  (ZOFRAN ) injection 4 mg (4 mg Intravenous Given 04/28/24 1445)                                    Medical Decision Making Amount and/or Complexity of Data Reviewed Labs: ordered. Radiology: ordered.  Risk Prescription drug management.   This patient presents to the ED for concern of  right hip pain, this involves Fitzgerald extensive number of treatment options, and is a complaint that carries with it a high risk of complications and morbidity.  The differential diagnosis includes, dislocation, strain, neoplasm   Co morbidities / Chronic conditions that complicate the patient evaluation  HTN, arthritis, HLD, anemia   Additional history obtained:  Additional history obtained from EMR External records from outside source obtained and reviewed including patient's son   Lab Tests:  I Ordered, and personally interpreted labs.  The pertinent results include: Creatinine,  normal electrolytes, mild leukocytosis, normal hemoglobin   Imaging Studies ordered:  I ordered imaging studies including EXTR of chest and right hip I independently visualized and interpreted imaging which showed right femoral neck fracture I agree with the radiologist interpretation   Cardiac Monitoring: / EKG:  The patient was maintained on a cardiac monitor.  I personally viewed and interpreted the cardiac monitored which showed Fitzgerald underlying rhythm of: Sinus rhythm   Problem List / ED Course / Critical interventions / Medication management  Patient presenting for right hip pain over the past week, worsened today.  At baseline, she lives independently and ambulates without a cane or walker, although she has both at home.  She took a misstep 6 days ago and has since had right hip pain.  This worsened today.  She has not been able to ambulate today.  On exam, she is well-appearing.  She does have shortening of her right leg.  X-ray imaging confirms right hip fracture.  I spoke with orthopedic surgeon on-call, who recommends a hospitalist admission for planned surgery tomorrow.  Patient's lab work is unremarkable.  Patient was admitted for further management. I ordered medication including general for analgesia, Zofran  for nausea Reevaluation of the patient after these medicines showed that the patient  improved I have reviewed the patients home medicines and have made adjustments as needed   Consultations Obtained:  I requested consultation with the orthopedic surgeon, Dr. Margrette,  and discussed lab and imaging findings as well as pertinent plan - they recommend: Admission to [facility], n.p.o. at midnight   Social Determinants of Health:  Lives independently     Final diagnoses:  Closed displaced fracture of right femoral neck [facility])    ED Discharge Orders     None          Melvenia Motto, MD 04/28/24 1641

## 2024-04-28 NOTE — ED Triage Notes (Signed)
 Pt brought in by [facility]. Reports right hip pain that began 04/22/24 after she stepped off of a step in a precarious manner. Pt was attempting to use the bathroom today and was unable to stand back up, as the pain became much worse. Shortening and rotation is noted of the right leg. CMS intact. Pt is AOx4. She denies any other complaints of pain. [facility] administered 4mg  of Morphine PTA.

## 2024-04-28 NOTE — H&P (Signed)
 History and Physical    Patient: Stacey Fitzgerald FMW:[id number] DOB: Dec 10, 1940 DOA: 04/28/2024 DOS: the patient was seen and examined on 04/28/2024 PCP: Ostwalt, [name], PA-C  Patient coming from: Home  Chief Complaint: Right hip pain Chief Complaint  Patient presents with   Hip Pain   HPI: Stacey Fitzgerald is a 83 y.o. female with medical history significant of hypertension, hyperlipidemia, hypothyroidism, who lives alone at and was otherwise well until about a week ago when she slipped and fell.  According to patient she only had some minimal pain in the right hip but has been able to walk with no issues until today when she started experiencing severe pain involving the left hip and subsequently have not been able to get out of laying posture.  Patient therefore brought in for further management.  At the time she was being seen she admits to pain of 8/10 however improved after giving pain medication in the emergency room.  She denies nausea vomiting abdominal pain chest pain cough or urinary complaints.  ED course: Temperature 97.5, respiratory rate 18, pulse 89, BP 148/84 saturating 99% on room air. Right hip x-ray showed a right femoral neck fracture with varus angulation.  ED physician discussed with orthopedic surgeon on-call who agreed for patient to be admitted for surgical intervention tomorrow.  [facility] was therefore contacted to admit patient for further management  Review of Systems: As mentioned in the history of present illness. All other systems reviewed and are negative. Past Medical History:  Diagnosis Date   Arthritis    Blood transfusion without reported diagnosis    10/09/23   Cancer (HCC)    10/10/23   Female cystocele    Hyperlipidemia    Hypertension    Hypothyroidism    Menopausal state    Obesity, unspecified    Pessary maintenance    Rectocele    Past Surgical History:  Procedure Laterality Date   ABDOMINAL HYSTERECTOMY     BREAST EXCISIONAL BIOPSY Left  1972   benign   BREAST SURGERY     benign cyst   COLON SURGERY  10/11/2023   COLONOSCOPY     COLONOSCOPY N/A 10/09/2023   Procedure: COLONOSCOPY;  Surgeon: Shaaron Lamar HERO, MD;  Location: [facility];  Service: Endoscopy;  Laterality: N/A;   COLONOSCOPY WITH PROPOFOL  N/A 11/23/2019   Procedure: COLONOSCOPY WITH PROPOFOL ;  Surgeon: Toledo, Ladell POUR, MD;  Location: [facility];  Service: Gastroenterology;  Laterality: N/A;   ESOPHAGOGASTRODUODENOSCOPY N/A 10/09/2023   Procedure: EGD (ESOPHAGOGASTRODUODENOSCOPY);  Surgeon: Shaaron Lamar HERO, MD;  Location: [facility];  Service: Endoscopy;  Laterality: N/A;   miscarrage     TUBAL LIGATION     uterine pessary     Social History:  reports that she has never smoked. She has never used smokeless tobacco. She reports that she does not currently use alcohol. She reports that she does not use drugs.  Allergies  Allergen Reactions   Cephalexin Hives    Patient reports that she is not allergic to this medication but she has not taken it since it was listed as an allergy    Family History  Problem Relation Age of Onset   Diabetes Mother    Seizures Mother    CVA Father    Dementia Brother    Cancer Brother    Diabetes Maternal Grandfather    Mental illness Sister    Breast cancer Neg Hx     Prior to Admission medications  Medication Sig Start Date End Date Taking? Authorizing Provider  acetaminophen  (TYLENOL ) 500 MG tablet Take 500 mg by mouth every 6 (six) hours as needed.    [provider]  ferrous sulfate  325 (65 FE) MG tablet Take 1 tablet (325 mg total) by mouth every other day. 03/11/24   Davonna Siad, MD  lisinopril  (ZESTRIL ) 20 MG tablet TAKE 1 TABLET BY MOUTH TWICE A DAY 03/24/24   Ostwalt, [name], PA-C  simvastatin  (ZOCOR ) 20 MG tablet Take 1 tablet (20 mg total) by mouth at bedtime. 10/28/23   Landy Barnie RAMAN, NP    Physical Exam: Vitals:   04/28/24 1317 04/28/24 1449  BP:  (!) 148/84  Pulse:  89  Resp:   18  Temp:  (!) 97.5 F (36.4 C)  TempSrc:  Oral  SpO2: 99% 99%    General - Elderly female, no apparent distress HEENT - PERRLA Lung - Clear, basal rales, no rhonchi, wheezes. Heart - S1, S2 heard Abdomen - Soft, non tender obese, bowel sounds good Neuro - Alert, awake and oriented, non focal exam.,  Tenderness to the right hip area. Skin - Warm and dry.  dressing noted.   Data Reviewed: Right hip x-ray showed a right femoral neck fracture with varus angulation.     Latest Ref Rng & Units 04/28/2024    2:35 PM 04/15/2024    8:37 AM 02/27/2024    2:33 PM  CBC  WBC 4.0 - 10.5 K/uL 12.8  10.1  9.4   Hemoglobin 12.0 - 15.0 g/dL 87.7  87.2  87.7   Hematocrit 36.0 - 46.0 % 37.1  39.2  38.8   Platelets 150 - 400 K/uL 350  289  308        Latest Ref Rng & Units 04/28/2024    2:35 PM 04/15/2024    8:37 AM 02/27/2024    2:33 PM  BMP  Glucose 70 - 99 mg/dL 898  79  887   BUN 8 - 23 mg/dL 20  21  20    Creatinine 0.44 - 1.00 mg/dL 8.97  8.94  8.92   Sodium 135 - 145 mmol/L 139  141  141   Potassium 3.5 - 5.1 mmol/L 4.2  4.1  4.2   Chloride 98 - 111 mmol/L 103  107  104   CO2 22 - 32 mmol/L 21  25  26    Calcium  8.9 - 10.3 mg/dL 89.8  9.3  9.4      Assessment and Plan:  Acute closed right hip fracture Right hip x-ray showed a right femoral neck fracture with varus angulation. Orthopedics consulted We will keep n.p.o. after midnight PT OT after surgical intervention As needed pain medication   Hyperlipidemia Continue statin therapy  Hypertension-continue lisinopril   Hypothyroidism Not on any home medication Obtain TSH   Advance Care Planning:   Code Status: Prior full code  Consults: Orthopedics  Family Communication: No family at bedside  Severity of Illness: The appropriate patient status for this patient is INPATIENT. Inpatient status is judged to be reasonable and necessary in order to provide the required intensity of service to ensure the patient's safety. The  patient's presenting symptoms, physical exam findings, and initial radiographic and laboratory data in the context of their chronic comorbidities is felt to place them at high risk for further clinical deterioration. Furthermore, it is not anticipated that the patient will be medically stable for discharge from the hospital within 2 midnights of admission.   * I certify that  at the point of admission it is my clinical judgment that the patient will require inpatient hospital care spanning beyond 2 midnights from the point of admission due to high intensity of service, high risk for further deterioration and high frequency of surveillance required.*  Author: Drue [name] Potter, MD 04/28/2024 4:52 PM  For on call review [email or web address].

## 2024-04-29 ENCOUNTER — Encounter (HOSPITAL_COMMUNITY)
Admission: EM | Disposition: A | Discharge status: Skilled Nursing Facility | Payer: Self-pay | Source: Home / Self Care | Attending: Internal Medicine

## 2024-04-29 ENCOUNTER — Inpatient Hospital Stay (HOSPITAL_COMMUNITY)

## 2024-04-29 ENCOUNTER — Other Ambulatory Visit: Payer: Self-pay

## 2024-04-29 ENCOUNTER — Encounter (HOSPITAL_COMMUNITY): Payer: Self-pay | Admitting: Internal Medicine

## 2024-04-29 DIAGNOSIS — S72001A Fracture of unspecified part of neck of right femur, initial encounter for closed fracture: Secondary | ICD-10-CM

## 2024-04-29 DIAGNOSIS — I10 Essential (primary) hypertension: Secondary | ICD-10-CM

## 2024-04-29 DIAGNOSIS — W19XXXA Unspecified fall, initial encounter: Secondary | ICD-10-CM

## 2024-04-29 DIAGNOSIS — E782 Mixed hyperlipidemia: Secondary | ICD-10-CM

## 2024-04-29 DIAGNOSIS — E039 Hypothyroidism, unspecified: Secondary | ICD-10-CM

## 2024-04-29 DIAGNOSIS — E7800 Pure hypercholesterolemia, unspecified: Secondary | ICD-10-CM

## 2024-04-29 HISTORY — PX: HIP ARTHROPLASTY: SHX981

## 2024-04-29 LAB — CBC
HCT: 37 % (ref 36.0–46.0)
HCT: 38 % (ref 36.0–46.0)
Hemoglobin: 11.8 g/dL — ABNORMAL LOW (ref 12.0–15.0)
Hemoglobin: 12.2 g/dL (ref 12.0–15.0)
MCH: 28.9 pg (ref 26.0–34.0)
MCH: 29.3 pg (ref 26.0–34.0)
MCHC: 31.9 g/dL (ref 30.0–36.0)
MCHC: 32.1 g/dL (ref 30.0–36.0)
MCV: 90.7 fL (ref 80.0–100.0)
MCV: 91.3 fL (ref 80.0–100.0)
Platelets: 299 K/uL (ref 150–400)
Platelets: 353 K/uL (ref 150–400)
RBC: 4.08 MIL/uL (ref 3.87–5.11)
RBC: 4.16 MIL/uL (ref 3.87–5.11)
RDW: 14.9 % (ref 11.5–15.5)
RDW: 14.3 % (ref 11.5–15.5)
WBC: 11.5 K/uL — ABNORMAL HIGH (ref 4.0–10.5)
WBC: 17 K/uL — ABNORMAL HIGH (ref 4.0–10.5)
nRBC: 0 % (ref 0.0–0.2)
nRBC: 0 % (ref 0.0–0.2)

## 2024-04-29 LAB — BASIC METABOLIC PANEL WITH GFR
Anion gap: 14 (ref 5–15)
BUN: 18 mg/dL (ref 8–23)
CO2: 21 mmol/L — ABNORMAL LOW (ref 22–32)
Calcium: 9.9 mg/dL (ref 8.9–10.3)
Chloride: 104 mmol/L (ref 98–111)
Creatinine, Ser: 0.98 mg/dL (ref 0.44–1.00)
GFR, Estimated: 57 mL/min — ABNORMAL LOW (ref >60)
Glucose, Bld: 96 mg/dL (ref 70–99)
Potassium: 4.2 mmol/L (ref 3.5–5.1)
Sodium: 139 mmol/L (ref 135–145)

## 2024-04-29 LAB — CREATININE, SERUM
Creatinine, Ser: 1.05 mg/dL — ABNORMAL HIGH (ref 0.44–1.00)
GFR, Estimated: 53 mL/min — ABNORMAL LOW (ref >60)

## 2024-04-29 LAB — SURGICAL PCR SCREEN
MRSA, PCR: NEGATIVE
Staphylococcus aureus: NEGATIVE

## 2024-04-29 SURGERY — HEMIARTHROPLASTY (BIPOLAR) HIP, POSTERIOR APPROACH FOR FRACTURE
Anesthesia: General | Site: Hip | Laterality: Right

## 2024-04-29 MED ORDER — HYDROCODONE-ACETAMINOPHEN 7.5-325 MG PO TABS
1.0000 | ORAL_TABLET | ORAL | Status: DC | PRN
  Administered 2024-05-01: 1 via ORAL
  Filled 2024-04-29: qty 1

## 2024-04-29 MED ORDER — DEXAMETHASONE SODIUM PHOSPHATE 10 MG/ML IJ SOLN
INTRAMUSCULAR | Status: DC | PRN
  Administered 2024-04-29: 4 mg via INTRAVENOUS

## 2024-04-29 MED ORDER — ENOXAPARIN SODIUM 40 MG/0.4ML IJ SOSY
40.0000 mg | PREFILLED_SYRINGE | INTRAMUSCULAR | Status: DC
  Administered 2024-04-30 – 2024-05-01 (×2): 40 mg via SUBCUTANEOUS
  Filled 2024-04-29 (×2): qty 0.4

## 2024-04-29 MED ORDER — PHENYLEPHRINE HCL (PRESSORS) 10 MG/ML IV SOLN
INTRAVENOUS | Status: DC | PRN
  Administered 2024-04-29 (×2): 80 ug via INTRAVENOUS

## 2024-04-29 MED ORDER — ONDANSETRON HCL 4 MG/2ML IJ SOLN
INTRAMUSCULAR | Status: AC
  Filled 2024-04-29: qty 2

## 2024-04-29 MED ORDER — BUPIVACAINE-EPINEPHRINE (PF) 0.5% -1:200000 IJ SOLN
INTRAMUSCULAR | Status: AC
  Filled 2024-04-29: qty 30

## 2024-04-29 MED ORDER — TRANEXAMIC ACID-NACL 1000-0.7 MG/100ML-% IV SOLN
1000.0000 mg | Freq: Once | INTRAVENOUS | Status: AC
  Administered 2024-04-29: 1000 mg via INTRAVENOUS
  Filled 2024-04-29: qty 100

## 2024-04-29 MED ORDER — CHLORHEXIDINE GLUCONATE 0.12 % MT SOLN
OROMUCOSAL | Status: AC
  Administered 2024-04-29: 15 mL
  Filled 2024-04-29: qty 15

## 2024-04-29 MED ORDER — BISACODYL 5 MG PO TBEC: 5.0000 mg | DELAYED_RELEASE_TABLET | Freq: Every day | ORAL | Status: DC | PRN

## 2024-04-29 MED ORDER — FENTANYL CITRATE (PF) 100 MCG/2ML IJ SOLN
INTRAMUSCULAR | Status: AC
  Filled 2024-04-29: qty 2

## 2024-04-29 MED ORDER — DEXAMETHASONE SODIUM PHOSPHATE 10 MG/ML IJ SOLN
INTRAMUSCULAR | Status: AC
Start: 2024-04-29 — End: 2024-04-29
  Filled 2024-04-29: qty 1

## 2024-04-29 MED ORDER — ONDANSETRON HCL 4 MG/2ML IJ SOLN
INTRAMUSCULAR | Status: DC | PRN
  Administered 2024-04-29: 4 mg via INTRAVENOUS

## 2024-04-29 MED ORDER — METOCLOPRAMIDE HCL 5 MG/ML IJ SOLN: 5.0000 mg | Freq: Three times a day (TID) | INTRAMUSCULAR | Status: DC | PRN

## 2024-04-29 MED ORDER — CHLORHEXIDINE GLUCONATE 0.12 % MT SOLN: 15.0000 mL | Freq: Once | OROMUCOSAL | Status: DC

## 2024-04-29 MED ORDER — HYDROMORPHONE HCL 1 MG/ML IJ SOLN
INTRAMUSCULAR | Status: DC | PRN
  Administered 2024-04-29 (×2): .25 mg via INTRAVENOUS

## 2024-04-29 MED ORDER — SODIUM CHLORIDE 0.9 % IV SOLN: INTRAVENOUS | Status: DC

## 2024-04-29 MED ORDER — ACETAMINOPHEN 10 MG/ML IV SOLN
1000.0000 mg | Freq: Once | INTRAVENOUS | Status: AC
  Administered 2024-04-29: 1000 mg via INTRAVENOUS
  Filled 2024-04-29: qty 100

## 2024-04-29 MED ORDER — METOCLOPRAMIDE HCL 5 MG PO TABS: 5.0000 mg | ORAL_TABLET | Freq: Three times a day (TID) | ORAL | Status: DC | PRN

## 2024-04-29 MED ORDER — TRAMADOL HCL 50 MG PO TABS
50.0000 mg | ORAL_TABLET | Freq: Once | ORAL | Status: AC
  Administered 2024-04-29: 50 mg via ORAL
  Filled 2024-04-29: qty 1

## 2024-04-29 MED ORDER — 0.9 % SODIUM CHLORIDE (POUR BTL) OPTIME
TOPICAL | Status: DC | PRN
  Administered 2024-04-29: 1000 mL

## 2024-04-29 MED ORDER — FENTANYL CITRATE (PF) 100 MCG/2ML IJ SOLN
INTRAMUSCULAR | Status: DC | PRN
  Administered 2024-04-29 (×8): 25 ug via INTRAVENOUS

## 2024-04-29 MED ORDER — BUPIVACAINE-EPINEPHRINE (PF) 0.5% -1:200000 IJ SOLN
INTRAMUSCULAR | Status: DC | PRN
  Administered 2024-04-29: 30 mL via PERINEURAL

## 2024-04-29 MED ORDER — POVIDONE-IODINE 10 % EX SWAB: 2.0000 | Freq: Once | CUTANEOUS | Status: DC

## 2024-04-29 MED ORDER — PROPOFOL 500 MG/50ML IV EMUL
INTRAVENOUS | Status: AC
  Filled 2024-04-29: qty 50

## 2024-04-29 MED ORDER — TRAMADOL HCL 50 MG PO TABS
50.0000 mg | ORAL_TABLET | Freq: Four times a day (QID) | ORAL | Status: DC
  Administered 2024-04-29 – 2024-04-30 (×6): 50 mg via ORAL
  Filled 2024-04-29 (×6): qty 1

## 2024-04-29 MED ORDER — VANCOMYCIN HCL IN DEXTROSE 1-5 GM/200ML-% IV SOLN
1000.0000 mg | Freq: Two times a day (BID) | INTRAVENOUS | Status: AC
  Administered 2024-04-30: 1000 mg via INTRAVENOUS
  Filled 2024-04-29: qty 200

## 2024-04-29 MED ORDER — CHLORHEXIDINE GLUCONATE 4 % EX SOLN
60.0000 mL | Freq: Once | CUTANEOUS | Status: DC
Start: 2024-04-29 — End: 2024-04-29

## 2024-04-29 MED ORDER — METHOCARBAMOL 500 MG PO TABS: 500.0000 mg | ORAL_TABLET | Freq: Four times a day (QID) | ORAL | Status: DC | PRN

## 2024-04-29 MED ORDER — VANCOMYCIN HCL 1000 MG IV SOLR
INTRAVENOUS | Status: DC | PRN
  Administered 2024-04-29: 1000 mg via INTRAVENOUS

## 2024-04-29 MED ORDER — ORAL CARE MOUTH RINSE: 15.0000 mL | Freq: Once | OROMUCOSAL | Status: DC

## 2024-04-29 MED ORDER — PROPOFOL 10 MG/ML IV BOLUS
INTRAVENOUS | Status: DC | PRN
  Administered 2024-04-29: 60 mg via INTRAVENOUS
  Administered 2024-04-29: 120 mg via INTRAVENOUS
  Administered 2024-04-29: 35 ug/kg/min via INTRAVENOUS

## 2024-04-29 MED ORDER — LACTATED RINGERS IV SOLN: INTRAVENOUS | Status: DC | PRN

## 2024-04-29 MED ORDER — HYDROCODONE-ACETAMINOPHEN 5-325 MG PO TABS
1.0000 | ORAL_TABLET | ORAL | Status: DC | PRN
  Administered 2024-04-30 (×2): 1 via ORAL
  Administered 2024-05-01: 2 via ORAL
  Filled 2024-04-29: qty 2
  Filled 2024-04-29 (×2): qty 1

## 2024-04-29 MED ORDER — LACTATED RINGERS IV SOLN
INTRAVENOUS | Status: DC
Start: 2024-04-29 — End: 2024-04-29

## 2024-04-29 MED ORDER — HYDROMORPHONE HCL 1 MG/ML IJ SOLN
INTRAMUSCULAR | Status: AC
  Filled 2024-04-29: qty 0.5

## 2024-04-29 MED ORDER — SODIUM CHLORIDE 0.9 % IR SOLN
Status: DC | PRN
  Administered 2024-04-29: 3000 mL

## 2024-04-29 MED ORDER — MENTHOL 3 MG MT LOZG: 1.0000 | LOZENGE | OROMUCOSAL | Status: DC | PRN

## 2024-04-29 MED ORDER — PHENOL 1.4 % MT LIQD: 1.0000 | OROMUCOSAL | Status: DC | PRN

## 2024-04-29 MED ORDER — PHENYLEPHRINE 80 MCG/ML (10ML) SYRINGE FOR IV PUSH (FOR BLOOD PRESSURE SUPPORT)
PREFILLED_SYRINGE | INTRAVENOUS | Status: AC
  Filled 2024-04-29: qty 10

## 2024-04-29 MED ORDER — VANCOMYCIN HCL IN DEXTROSE 1-5 GM/200ML-% IV SOLN
1000.0000 mg | INTRAVENOUS | Status: DC
  Filled 2024-04-29: qty 200

## 2024-04-29 MED ORDER — MORPHINE SULFATE (PF) 2 MG/ML IV SOLN: 0.5000 mg | INTRAVENOUS | Status: DC | PRN

## 2024-04-29 MED ORDER — POLYETHYLENE GLYCOL 3350 17 G PO PACK: 17.0000 g | PACK | Freq: Every day | ORAL | Status: DC | PRN

## 2024-04-29 MED ORDER — ACETAMINOPHEN 325 MG PO TABS: 325.0000 mg | ORAL_TABLET | Freq: Four times a day (QID) | ORAL | Status: DC | PRN

## 2024-04-29 MED ORDER — ACETAMINOPHEN 500 MG PO TABS
500.0000 mg | ORAL_TABLET | Freq: Four times a day (QID) | ORAL | Status: AC
  Administered 2024-04-29 – 2024-04-30 (×4): 500 mg via ORAL
  Filled 2024-04-29 (×4): qty 1

## 2024-04-29 MED ORDER — METHOCARBAMOL 1000 MG/10ML IJ SOLN: 500.0000 mg | Freq: Four times a day (QID) | INTRAMUSCULAR | Status: DC | PRN

## 2024-04-29 MED ORDER — DOCUSATE SODIUM 100 MG PO CAPS
100.0000 mg | ORAL_CAPSULE | Freq: Two times a day (BID) | ORAL | Status: DC
  Administered 2024-04-29 – 2024-05-01 (×4): 100 mg via ORAL
  Filled 2024-04-29 (×4): qty 1

## 2024-04-29 MED ORDER — TRANEXAMIC ACID-NACL 1000-0.7 MG/100ML-% IV SOLN
1000.0000 mg | INTRAVENOUS | Status: AC
  Administered 2024-04-29: 1000 mg via INTRAVENOUS
  Filled 2024-04-29: qty 100

## 2024-04-29 SURGICAL SUPPLY — 51 items
BALL HIP ARTICU 28 +5 (Hips) IMPLANT
BIT DRILL 2.8X128 (BIT) ×1 IMPLANT
BLADE SAGITTAL 25.0X1.27X90 (BLADE) ×1 IMPLANT
CHLORAPREP W/TINT 26 (MISCELLANEOUS) ×1 IMPLANT
CLOTH BEACON ORANGE TIMEOUT ST (SAFETY) ×1 IMPLANT
COUNTER NDL MAGNETIC 40 RED (SET/KITS/TRAYS/PACK) ×1 IMPLANT
COUNTER NEEDLE MAGNETIC 40 RED (SET/KITS/TRAYS/PACK) ×1 IMPLANT
COVER LIGHT HANDLE STERIS (MISCELLANEOUS) ×2 IMPLANT
DRAPE HIP W/POCKET STRL (MISCELLANEOUS) ×1 IMPLANT
DRAPE SURG ORHT 6 SPLT 77X108 (DRAPES) IMPLANT
DRAPE U-SHAPE 47X51 STRL (DRAPES) ×1 IMPLANT
DRESSING AQUACEL AG ADV 3.5X12 (MISCELLANEOUS) ×1 IMPLANT
DRSG MEPILEX SACRM 8.7X9.8 (GAUZE/BANDAGES/DRESSINGS) ×1 IMPLANT
ELECTRODE REM PT RTRN 9FT ADLT (ELECTROSURGICAL) ×1 IMPLANT
GLOVE BIOGEL PI IND STRL 7.0 (GLOVE) ×2 IMPLANT
GLOVE BIOGEL PI IND STRL 8.5 (GLOVE) ×1 IMPLANT
GLOVE ECLIPSE 7.0 STRL STRAW (GLOVE) IMPLANT
GLOVE SKINSENSE STRL SZ8.0 LF (GLOVE) ×2 IMPLANT
GOWN STRL REUS W/TWL LRG LVL3 (GOWN DISPOSABLE) ×2 IMPLANT
GOWN STRL REUS W/TWL XL LVL3 (GOWN DISPOSABLE) ×1 IMPLANT
HEAD BIPOLAR DEPUY 47 (Hips) IMPLANT
INST SET MAJOR BONE (KITS) ×1 IMPLANT
KIT TURNOVER KIT A (KITS) ×1 IMPLANT
MANIFOLD NEPTUNE II (INSTRUMENTS) ×1 IMPLANT
MARKER SKIN DUAL TIP RULER LAB (MISCELLANEOUS) ×1 IMPLANT
NDL HYPO 18GX1.5 BLUNT FILL (NEEDLE) ×1 IMPLANT
NDL HYPO 21X1.5 SAFETY (NEEDLE) ×1 IMPLANT
NDL MAYO 1/2 CRC TROCAR PT (NEEDLE) IMPLANT
NEEDLE HYPO 18GX1.5 BLUNT FILL (NEEDLE) ×1 IMPLANT
NEEDLE HYPO 21X1.5 SAFETY (NEEDLE) ×1 IMPLANT
NEEDLE MAYO 1/2 CRC TROCAR PT (NEEDLE) ×1 IMPLANT
NS IRRIG 1000ML POUR BTL (IV SOLUTION) ×1 IMPLANT
PACK TOTAL JOINT (CUSTOM PROCEDURE TRAY) ×1 IMPLANT
PAD ARMBOARD POSITIONER FOAM (MISCELLANEOUS) ×1 IMPLANT
PENCIL SMOKE EVACUATOR (MISCELLANEOUS) IMPLANT
POSITIONER HEAD 8X9X4 ADT (SOFTGOODS) ×1 IMPLANT
SET BASIN LINEN APH (SET/KITS/TRAYS/PACK) ×1 IMPLANT
SET HNDPC FAN SPRY TIP SCT (DISPOSABLE) ×1 IMPLANT
SOL .9 NS 3000ML IRR UROMATIC (IV SOLUTION) IMPLANT
STAPLER VISISTAT (STAPLE) ×1 IMPLANT
STEM FEM ACTIS STD SZ7 (Nail) IMPLANT
SUT BRALON NAB BRD #1 30IN (SUTURE) ×2 IMPLANT
SUT ETHIBOND 5 LR DA (SUTURE) ×2 IMPLANT
SUT MNCRL 0 VIOLET CTX 36 (SUTURE) ×1 IMPLANT
SUT MON AB 0 CT1 (SUTURE) ×1 IMPLANT
SUT VIC AB 1 CT1 27XBRD ANTBC (SUTURE) ×4 IMPLANT
SYR 30ML LL (SYRINGE) ×1 IMPLANT
SYR BULB IRRIG 60ML STRL (SYRINGE) ×1 IMPLANT
TRAY FOLEY MTR SLVR 16FR STAT (SET/KITS/TRAYS/PACK) ×1 IMPLANT
WATER STERILE IRR 1000ML POUR (IV SOLUTION) ×2 IMPLANT
YANKAUER SUCT 12FT TUBE ARGYLE (SUCTIONS) ×1 IMPLANT

## 2024-04-29 NOTE — Transfer of Care (Signed)
 Immediate Anesthesia Transfer of Care Note  Patient: Stacey Fitzgerald  Procedure(s) Performed: HEMIARTHROPLASTY (BIPOLAR) HIP, lateral APPROACH FOR FRACTURE (Right: Hip)  Patient Location: PACU  Anesthesia Type:General  Level of Consciousness: awake  Airway & Oxygen Therapy: Patient Spontanous Breathing and Patient connected to face mask oxygen  Post-op Assessment: Report given to RN and Post -op Vital signs reviewed and stable  Post vital signs: Reviewed and stable  Last Vitals:  Vitals Value Taken Time  BP 175/103 04/29/24 15:35  Temp 97.5   Pulse 86 04/29/24 15:37  Resp 14 04/29/24 15:37  SpO2 99 % 04/29/24 15:37  Vitals shown include unfiled device data.  Last Pain:  Vitals:   04/29/24 1159  TempSrc: Oral  PainSc: 6       Patients Stated Pain Goal: 6 (04/29/24 1159)  Complications: No notable events documented.

## 2024-04-29 NOTE — Op Note (Signed)
 Orthopaedic Surgery Operative Note (CSN: [id number])  Stacey Fitzgerald  07/14/1941 Date of Surgery: 04/29/2024   Diagnoses:  right femoral neck fracture  Procedure: Bipolar replacement right hip    TXA [used/not used]yes   Operative Finding Right femoral neck fracture with complete displacement  Arthritic changes on the head none acetabulum none  Avulsion of gluteus medius tendon from greater trochanter   Post-Op Diagnosis: Same Surgeons:Primary: Margrette Taft BRAVO, MD Assistants: Montie Eke Location: [facility] OR ROOM 4 Anesthesia: General After failed attempted spinal Antibiotics: Vancomycin secondary to Ancef allergy   Estimated Blood Loss: 100 cc Complications: None Specimens: None   Implants: Implant Name Type Inv. Item Serial No. Manufacturer Lot No. LRB No. Used Action  HEAD BIPOLAR DEPUY 47 - [id number] Hips HEAD BIPOLAR DEPUY 47  [employer]PU[employer] [id number] Right 1 Implanted  STEM FEM ACTIS STD SZ7 - [id number] Nail STEM FEM ACTIS STD SZ7  DEPUY ORTHOPA[employer]ICS [id number] Right 1 Implanted  BALL HIP ARTICU 28 +5 - [id number] Hips BALL HIP ARTICU 28 +5  DEPUY ORTH[employer] [id number] Right 1 Implanted     Indication for surgery complete fracture right hip Transexamic  acid was given as  DICTATION: .Dragon Dictation   The patient was taken to the recovery room in stable condition  PLAN OF CARE: Weight-bear as tolerated  PATIENT DISPOSITION:  PACU - hemodynamically stable.   Delay start of Pharmacological VTE agent (>24hrs) due to surgical blood loss or risk of bleeding: Yes  Details of surgery: The patient was identified by 2 approved identification mechanisms. The operative extremity was evaluated and found to be acceptable for surgical treatment today. The chart was reviewed. The surgical site was confirmed initials were placed over the right hip  The patient was taken to the operating room and vancomycin was given IV antibiotics due to allergy to  Ancef  Initial attempt was made to place a spinal anesthetic but was unsuccessful therefore general anesthesia was administered  Foley catheter insertion was completed  The patient was then placed in the lateral decubitus position with appropriate padding. The surgical site was prepped and draped sterilely.  Timeout was executed confirming the patient's name, surgical site, antibiotic administration, x-rays available, and implants were checked and were available.  Incision was made over the greater trochanter extended proximally and distally approximately 3 cm  Subcutaneous tissue was divided down to fascia which was then split in line with the skin incision and deep retractors were placed.  The greater trochanteric bursa was large and a significant mount of fluid was noted.  This was resected  The gluteus medius tendon was avulsed from the trochanter therefore  The gluteus medius minimus and vastus lateralis was dissected from bone and 1 continuous flap  The capsule was teed and preserved tagging with 2 Vicryl sutures  The femoral head was removed by placing 2 small Hohmann retractors around the head and dislocating the femur  The head size was measured to 47 The acetabular was inspected: There was no evidence of arthritis on the acetabulum or femoral head with the hip  With the hip dislocated anteriorly  The proximal femur was prepared starting with a femoral neck cutting guide, box osteotome, and further preparation per manufacture technique  The lesser trochanter was exposed  Broaching was started with a size 1 and broached up to appropriate size based on proximal fit and fill.  This was a size 7  Trial reduction was then performed using a 7 femur  Trial reduction we found the  hip to be stable using a 7 femur broach +5 neck length and 47 head  The trial implants were then removed 2 drill holes were placed in the greater trochanter and a #5 Ethibond was passed through the  drill holes   The acetabulum was irrigated and cleaned of any bony debris, the  implants were placed and the hip was reduced  The hip was stable throughout the range of motion.  We checked the following positions  Hip flexion internal rotation  Hip extension with external rotation  Sleep position stress test  Shuck test  Leg lengths  Local anesthetic Marcaine  with epinephrine was injected in the soft tissues including the hip capsule, the   The hip capsule was repaired with #1 Vicryl   The vastus lateralis gluteus minimus medius flap was closed with #1 Vicryl for the vastus lateralis portion, the #5 Surgilon to repair the gluteal tendons to the greater tuberosity, and then #1 Vicryl to repair the remaining muscle proximally  The hip was then abducted the fascia was closed with #1 Braylon  Subcutaneous tissues were closed with 0 Monocryl and 2 layers followed by staples   A sterile dressing was applied  The patient was taken recovery room in stable condition   Postop plan  Weightbearing as tolerated Direct lateral hip precautions DVT prophylaxis for 30 days Remove staples at 12 to 14 days Postop appointment scheduled for 28 days  [id number]

## 2024-04-29 NOTE — Consult Note (Signed)
 Reason for Consult: Right hip pain  Member Name Role and Specialty Contact Info Start End Comments  Darci Pore, MD Attending Phone: [phone] Fax: [phone] 04/29/2024 06:00 AM - -   Stacey Fitzgerald is an 83 y.o. female.  HPI: This is an 83 year old female who had a misstep off of a curb on October 1 had some mild hip pain but was able to ambulate with a walker.  Over the next week she continued to have increasing pain and then on October 7 she sat down and could not get up.  EMS was brought to the scene she was complaining of right hip pain and was brought to the hospital for evaluation.  Her evaluation revealed that she has a right femoral neck fracture with 100% displacement  She does complain of right hip pain it is increased when she rolls over to her right side.  She is unable to bear weight on her right lower extremity  Past Medical History:  Diagnosis Date   Arthritis    Blood transfusion without reported diagnosis    10/09/23   Cancer (HCC)    10/10/23   Female cystocele    Hyperlipidemia    Hypertension    Hypothyroidism    Menopausal state    Obesity, unspecified    Pessary maintenance    Rectocele     Past Surgical History:  Procedure Laterality Date   ABDOMINAL HYSTERECTOMY     BREAST EXCISIONAL BIOPSY Left 1972   benign   BREAST SURGERY     benign cyst   COLON SURGERY  10/11/2023   COLONOSCOPY     COLONOSCOPY N/A 10/09/2023   Procedure: COLONOSCOPY;  Surgeon: Shaaron Lamar HERO, MD;  [facility];  Service: Endoscopy;  Laterality: N/A;   COLONOSCOPY WITH PROPOFOL  N/A 11/23/2019   Procedure: COLONOSCOPY WITH PROPOFOL ;  Surgeon: Toledo, Ladell POUR, MD;  Location: [facility];  Service: Gastroenterology;  Laterality: N/A;   ESOPHAGOGASTRODUODENOSCOPY N/A 10/09/2023   Procedure: EGD (ESOPHAGOGASTRODUODENOSCOPY);  Surgeon: Shaaron Lamar HERO, MD;  Location: [facility];  Service: Endoscopy;  Laterality: N/A;   miscarrage     TUBAL  LIGATION     uterine pessary      Family History  Problem Relation Age of Onset   Diabetes Mother    Seizures Mother    CVA Father    Dementia Brother    Cancer Brother    Diabetes Maternal Grandfather    Mental illness Sister    Breast cancer Neg Hx     Social History:  reports that she has never smoked. She has never used smokeless tobacco. She reports that she does not currently use alcohol. She reports that she does not use drugs.  Allergies:  Allergies  Allergen Reactions   Cephalexin Hives    Patient reports that she is not allergic to this medication but she has not taken it since it was listed as an allergy    Medications: I have reviewed the patient's current medications.  Results for orders placed or performed during the hospital encounter of 04/28/24 (from the past 48 hours)  Basic metabolic panel     Status: Abnormal   Collection Time: 04/28/24  2:35 PM  Result Value Ref Range   Sodium 139 135 - 145 mmol/L   Potassium 4.2 3.5 - 5.1 mmol/L   Chloride 103 98 - 111 mmol/L   CO2 21 (L) 22 - 32 mmol/L   Glucose, Bld 101 (H) 70 - 99 mg/dL  Comment: Glucose reference range applies only to samples taken after fasting for at least 8 hours.   BUN 20 8 - 23 mg/dL   Creatinine, Ser 8.97 (H) 0.44 - 1.00 mg/dL   Calcium  10.1 8.9 - 10.3 mg/dL   GFR, Estimated 55 (L) >60 mL/min    Comment: (NOTE) Calculated using the CKD-EPI Creatinine Equation (2021)    Anion gap 15 5 - 15    Comment: Performed at [facility], [address] [place], [place] [address]  CBC with Differential     Status: Abnormal   Collection Time: 04/28/24  2:35 PM  Result Value Ref Range   WBC 12.8 (H) 4.0 - 10.5 K/uL   RBC 4.17 3.87 - 5.11 MIL/uL   Hemoglobin 12.2 12.0 - 15.0 g/dL   HCT 62.8 63.9 - 53.9 %   MCV 89.0 80.0 - 100.0 fL   MCH 29.3 26.0 - 34.0 pg   MCHC 32.9 30.0 - 36.0 g/dL   RDW 85.4 88.4 - 84.4 %   Platelets 350 150 - 400 K/uL   nRBC 0.0 0.0 - 0.2 %   Neutrophils Relative % 78  %   Neutro Abs 9.9 (H) 1.7 - 7.7 K/uL   Lymphocytes Relative 12 %   Lymphs Abs 1.6 0.7 - 4.0 K/uL   Monocytes Relative 9 %   Monocytes Absolute 1.2 (H) 0.1 - 1.0 K/uL   Eosinophils Relative 0 %   Eosinophils Absolute 0.0 0.0 - 0.5 K/uL   Basophils Relative 0 %   Basophils Absolute 0.0 0.0 - 0.1 K/uL   Immature Granulocytes 1 %   Abs Immature Granulocytes 0.06 0.00 - 0.07 K/uL    Comment: Performed at [facility], [address] [place], [place] [address]  Protime-INR     Status: None   Collection Time: 04/28/24  2:35 PM  Result Value Ref Range   Prothrombin Time 13.9 11.4 - 15.2 seconds   INR 1.0 0.8 - 1.2    Comment: (NOTE) INR goal varies based on device and disease states. Performed at [facility], [address] [place], [place] [address]   Magnesium     Status: None   Collection Time: 04/28/24  2:35 PM  Result Value Ref Range   Magnesium 2.1 1.7 - 2.4 mg/dL    Comment: Performed at [facility], [address], [place], [place] [address]  TSH     Status: None   Collection Time: 04/28/24  2:35 PM  Result Value Ref Range   TSH 3.250 0.350 - 4.500 uIU/mL    Comment: Performed at [facility], [address] [place], [place] [address]  Basic metabolic panel     Status: Abnormal   Collection Time: 04/29/24  4:29 AM  Result Value Ref Range   Sodium 139 135 - 145 mmol/L   Potassium 4.2 3.5 - 5.1 mmol/L   Chloride 104 98 - 111 mmol/L   CO2 21 (L) 22 - 32 mmol/L   Glucose, Bld 96 70 - 99 mg/dL    Comment: Glucose reference range applies only to samples taken after fasting for at least 8 hours.   BUN 18 8 - 23 mg/dL   Creatinine, Ser 9.01 0.44 - 1.00 mg/dL   Calcium  9.9 8.9 - 10.3 mg/dL   GFR, Estimated 57 (L) >60 mL/min    Comment: (NOTE) Calculated using the CKD-EPI Creatinine Equation (2021)    Anion gap 14 5 - 15    Comment: Performed at [facility], [address], [place], [place] [address]  CBC     Status: Abnormal   Collection Time: 04/29/24  4:29 AM  Result  Value Ref Range   WBC 11.5 (H) 4.0 - 10.5 K/uL   RBC 4.08 3.87 - 5.11 MIL/uL   Hemoglobin 11.8 (L) 12.0 - 15.0 g/dL   HCT 62.9 63.9 - 53.9 %   MCV 90.7 80.0 - 100.0 fL   MCH 28.9 26.0 - 34.0 pg   MCHC 31.9 30.0 - 36.0 g/dL   RDW 85.0 88.4 - 84.4 %   Platelets 299 150 - 400 K/uL   nRBC 0.0 0.0 - 0.2 %    Comment: Performed at [facility], [address] [place], [place] [address]  Surgical pcr screen     Status: None   Collection Time: 04/29/24  4:33 AM   Specimen: Nasal Mucosa; Nasal Swab  Result Value Ref Range   MRSA, PCR NEGATIVE NEGATIVE   Staphylococcus aureus NEGATIVE NEGATIVE    Comment: (NOTE) The Xpert SA Assay (FDA approved for NASAL specimens in patients 69 years of age and older), is one component of a comprehensive surveillance program. It is not intended to diagnose infection nor to guide or monitor treatment. Performed at [facility], [address] [place], [place] [address]     Imaging assessment  The report of the chest x-ray is as stated  My reading of the hip fracture: Shows a completely displaced femoral neck fracture  DG Chest Port 1 View Result Date: 04/28/2024 CLINICAL DATA:  Fall. EXAM: PORTABLE CHEST 1 VIEW COMPARISON:  10/10/2023 FINDINGS: Shallow inspiration. Heart size and pulmonary vascularity are normal for technique. Small right pleural effusion or thickening with atelectasis in the right base. This is similar to previous study. Left lung is clear. No pneumothorax. Mediastinal contours appear intact. Probable nondisplaced fracture of the anterior right fifth rib. Degenerative changes in the shoulders. IMPRESSION: Shallow inspiration. Fluid or thickened pleura in the right costophrenic angle with atelectasis in the right base. Probable fracture of the anterior right fifth rib. Electronically Signed   By: Elsie Gravely M.D.   On: 04/28/2024 15:05   DG Hip Unilat W or Wo Pelvis 2-3 Views Right Result Date: 04/28/2024 EXAM: 2 or 3 VIEW(S) XRAY OF  THE RIGHT HIP 04/28/2024 01:54:14 PM COMPARISON: None available. CLINICAL HISTORY: fall, pain. Pt brought in by [facility]. Reports right hip pain that began 04/22/24 after she stepped off of a step in a precarious manner. Pt was attempting to use the bathroom today and was unable to stand back up, as the pain became much worse. Shortening and rotation is noted of the right leg. CMS intact. Pt is AOx4. She denies any other complaints of pain. [facility] administered 4mg  of Morphine PTA. FINDINGS: BONES AND JOINTS: Fracture through the right femoral neck subcapital fracture. There is varus angulation of the femoral head. No dislocation. The hip joint is maintained. No significant degenerative changes. SOFT TISSUES: The soft tissues are unremarkable. IMPRESSION: 1. Right femoral neck subcapital fracture with varus angulation. No dislocation. Electronically signed by: Norleen Boxer MD 04/28/2024 02:36 PM EDT RP Workstation: [id number]    Review of Systems  Constitutional:  Negative for chills and fever.  HENT:  Positive for hearing loss.   Respiratory:  Negative for shortness of breath.   Cardiovascular:  Negative for chest pain.  Gastrointestinal: Negative.   Endocrine: Negative.   Genitourinary: Negative.   Skin: Negative.   Allergic/Immunologic: Negative.   Neurological: Negative.   Hematological: Negative.  Psychiatric/Behavioral: Negative.     Blood pressure 111/89, pulse 80, temperature 98.2 F (36.8 C), temperature source Oral, resp. rate 18, SpO2 95%. Physical Exam Vitals and nursing note reviewed.  Constitutional:      General: She is not in acute distress.    Appearance: Normal appearance. She is normal weight. She is not ill-appearing, toxic-appearing or diaphoretic.  HENT:     Head: Normocephalic and atraumatic.     Right Ear: External ear normal.     Left Ear: External ear normal.     Nose: Nose normal. No congestion or rhinorrhea.     Mouth/Throat:     Mouth: Mucous membranes are moist.      Pharynx: No oropharyngeal exudate or posterior oropharyngeal erythema.  Eyes:     General: No scleral icterus.       Right eye: No discharge.        Left eye: No discharge.     Extraocular Movements: Extraocular movements intact.     Conjunctiva/sclera: Conjunctivae normal.     Pupils: Pupils are equal, round, and reactive to light.  Cardiovascular:     Rate and Rhythm: Normal rate and regular rhythm.     Heart sounds: Normal heart sounds.  Pulmonary:     Effort: Pulmonary effort is normal. No respiratory distress.     Breath sounds: Normal breath sounds. No stridor.  Chest:     Chest wall: No tenderness.  Abdominal:     General: Abdomen is flat. Bowel sounds are normal. There is no distension.     Palpations: Abdomen is soft. There is no mass.     Tenderness: There is no abdominal tenderness.  Musculoskeletal:     Cervical back: Normal range of motion and neck supple. No rigidity or tenderness.     Comments: I do not see any abnormalities in her upper extremities or left lower extremity  She has tenderness over the right hip with leg deformity decreased range of motion normal muscle tone skin envelope is intact knee and ankle are appropriate position without subluxation or dislocation  Lymphadenopathy:     Cervical: No cervical adenopathy.  Skin:    General: Skin is warm and dry.     Capillary Refill: Capillary refill takes less than 2 seconds.  Neurological:     General: No focal deficit present.     Mental Status: She is alert and oriented to person, place, and time.     Cranial Nerves: No cranial nerve deficit.     Sensory: No sensory deficit.     Motor: No weakness.     Gait: Gait abnormal.     Deep Tendon Reflexes: Reflexes normal.  Psychiatric:        Mood and Affect: Mood normal.        Behavior: Behavior normal.        Thought Content: Thought content normal.        Judgment: Judgment normal.     Assessment/Plan: 83 year old female 6-1/2 to 60-month status  post colectomy for cancer presents with subacute right hip fracture  Based on her age and overall medical status recommend bipolar replacement right hip Patient has consented for bipolar hip replacement in the presence of her son understanding the risks of DVT PE infection leg length discrepancy limp and 6 to 28-month recovery.  Taft Minerva 04/29/2024, 8:15 AM

## 2024-04-29 NOTE — H&P (View-Only) (Signed)
 Reason for Consult: Right hip pain  Member Name Role and Specialty Contact Info Start End Comments  Darci Pore, MD Attending Phone: [phone] Fax: [phone] 04/29/2024 06:00 AM - -   Stacey Fitzgerald is an 83 y.o. female.  HPI: This is an 83 year old female who had a misstep off of a curb on October 1 had some mild hip pain but was able to ambulate with a walker.  Over the next week she continued to have increasing pain and then on October 7 she sat down and could not get up.  EMS was brought to the scene she was complaining of right hip pain and was brought to the hospital for evaluation.  Her evaluation revealed that she has a right femoral neck fracture with 100% displacement  She does complain of right hip pain it is increased when she rolls over to her right side.  She is unable to bear weight on her right lower extremity  Past Medical History:  Diagnosis Date   Arthritis    Blood transfusion without reported diagnosis    10/09/23   Cancer (HCC)    10/10/23   Female cystocele    Hyperlipidemia    Hypertension    Hypothyroidism    Menopausal state    Obesity, unspecified    Pessary maintenance    Rectocele     Past Surgical History:  Procedure Laterality Date   ABDOMINAL HYSTERECTOMY     BREAST EXCISIONAL BIOPSY Left 1972   benign   BREAST SURGERY     benign cyst   COLON SURGERY  10/11/2023   COLONOSCOPY     COLONOSCOPY N/A 10/09/2023   Procedure: COLONOSCOPY;  Surgeon: Shaaron Lamar HERO, MD;  [facility];  Service: Endoscopy;  Laterality: N/A;   COLONOSCOPY WITH PROPOFOL  N/A 11/23/2019   Procedure: COLONOSCOPY WITH PROPOFOL ;  Surgeon: Toledo, Ladell POUR, MD;  Location: [facility];  Service: Gastroenterology;  Laterality: N/A;   ESOPHAGOGASTRODUODENOSCOPY N/A 10/09/2023   Procedure: EGD (ESOPHAGOGASTRODUODENOSCOPY);  Surgeon: Shaaron Lamar HERO, MD;  [facility];  Service: Endoscopy;  Laterality: N/A;   miscarrage     TUBAL  LIGATION     uterine pessary      Family History  Problem Relation Age of Onset   Diabetes Mother    Seizures Mother    CVA Father    Dementia Brother    Cancer Brother    Diabetes Maternal Grandfather    Mental illness Sister    Breast cancer Neg Hx     Social History:  reports that she has never smoked. She has never used smokeless tobacco. She reports that she does not currently use alcohol. She reports that she does not use drugs.  Allergies:  Allergies  Allergen Reactions   Cephalexin Hives    Patient reports that she is not allergic to this medication but she has not taken it since it was listed as an allergy    Medications: I have reviewed the patient's current medications.  Results for orders placed or performed during the hospital encounter of 04/28/24 (from the past 48 hours)  Basic metabolic panel     Status: Abnormal   Collection Time: 04/28/24  2:35 PM  Result Value Ref Range   Sodium 139 135 - 145 mmol/L   Potassium 4.2 3.5 - 5.1 mmol/L   Chloride 103 98 - 111 mmol/L   CO2 21 (L) 22 - 32 mmol/L   Glucose, Bld 101 (H) 70 - 99 mg/dL  Comment: Glucose reference range applies only to samples taken after fasting for at least 8 hours.   BUN 20 8 - 23 mg/dL   Creatinine, Ser 8.97 (H) 0.44 - 1.00 mg/dL   Calcium  10.1 8.9 - 10.3 mg/dL   GFR, Estimated 55 (L) >60 mL/min    Comment: (NOTE) Calculated using the CKD-EPI Creatinine Equation (2021)    Anion gap 15 5 - 15    Comment: Performed at [facility], [address] [place], [place] [address]  CBC with Differential     Status: Abnormal   Collection Time: 04/28/24  2:35 PM  Result Value Ref Range   WBC 12.8 (H) 4.0 - 10.5 K/uL   RBC 4.17 3.87 - 5.11 MIL/uL   Hemoglobin 12.2 12.0 - 15.0 g/dL   HCT 62.8 63.9 - 53.9 %   MCV 89.0 80.0 - 100.0 fL   MCH 29.3 26.0 - 34.0 pg   MCHC 32.9 30.0 - 36.0 g/dL   RDW 85.4 88.4 - 84.4 %   Platelets 350 150 - 400 K/uL   nRBC 0.0 0.0 - 0.2 %   Neutrophils Relative % 78  %   Neutro Abs 9.9 (H) 1.7 - 7.7 K/uL   Lymphocytes Relative 12 %   Lymphs Abs 1.6 0.7 - 4.0 K/uL   Monocytes Relative 9 %   Monocytes Absolute 1.2 (H) 0.1 - 1.0 K/uL   Eosinophils Relative 0 %   Eosinophils Absolute 0.0 0.0 - 0.5 K/uL   Basophils Relative 0 %   Basophils Absolute 0.0 0.0 - 0.1 K/uL   Immature Granulocytes 1 %   Abs Immature Granulocytes 0.06 0.00 - 0.07 K/uL    Comment: Performed at [facility], [address] [place], [place] [address]  Protime-INR     Status: None   Collection Time: 04/28/24  2:35 PM  Result Value Ref Range   Prothrombin Time 13.9 11.4 - 15.2 seconds   INR 1.0 0.8 - 1.2    Comment: (NOTE) INR goal varies based on device and disease states. Performed at [facility], [address] [place], [place] [address]   Magnesium     Status: None   Collection Time: 04/28/24  2:35 PM  Result Value Ref Range   Magnesium 2.1 1.7 - 2.4 mg/dL    Comment: Performed at [facility], [address], [place], [place] [address]  TSH     Status: None   Collection Time: 04/28/24  2:35 PM  Result Value Ref Range   TSH 3.250 0.350 - 4.500 uIU/mL    Comment: Performed at [facility], [address] [place], [place] [address]  Basic metabolic panel     Status: Abnormal   Collection Time: 04/29/24  4:29 AM  Result Value Ref Range   Sodium 139 135 - 145 mmol/L   Potassium 4.2 3.5 - 5.1 mmol/L   Chloride 104 98 - 111 mmol/L   CO2 21 (L) 22 - 32 mmol/L   Glucose, Bld 96 70 - 99 mg/dL    Comment: Glucose reference range applies only to samples taken after fasting for at least 8 hours.   BUN 18 8 - 23 mg/dL   Creatinine, Ser 9.01 0.44 - 1.00 mg/dL   Calcium  9.9 8.9 - 10.3 mg/dL   GFR, Estimated 57 (L) >60 mL/min    Comment: (NOTE) Calculated using the CKD-EPI Creatinine Equation (2021)    Anion gap 14 5 - 15    Comment: Performed at [facility], [address], [place], [place] [address]  CBC     Status: Abnormal   Collection Time: 04/29/24  4:29 AM  Result  Value Ref Range   WBC 11.5 (H) 4.0 - 10.5 K/uL   RBC 4.08 3.87 - 5.11 MIL/uL   Hemoglobin 11.8 (L) 12.0 - 15.0 g/dL   HCT 62.9 63.9 - 53.9 %   MCV 90.7 80.0 - 100.0 fL   MCH 28.9 26.0 - 34.0 pg   MCHC 31.9 30.0 - 36.0 g/dL   RDW 85.0 88.4 - 84.4 %   Platelets 299 150 - 400 K/uL   nRBC 0.0 0.0 - 0.2 %    Comment: Performed at [facility], [address] [place], [place] [address]  Surgical pcr screen     Status: None   Collection Time: 04/29/24  4:33 AM   Specimen: Nasal Mucosa; Nasal Swab  Result Value Ref Range   MRSA, PCR NEGATIVE NEGATIVE   Staphylococcus aureus NEGATIVE NEGATIVE    Comment: (NOTE) The Xpert SA Assay (FDA approved for NASAL specimens in patients 12 years of age and older), is one component of a comprehensive surveillance program. It is not intended to diagnose infection nor to guide or monitor treatment. Performed at [facility], [address] [place], [place] [address]     Imaging assessment  The report of the chest x-ray is as stated  My reading of the hip fracture: Shows a completely displaced femoral neck fracture  DG Chest Port 1 View Result Date: 04/28/2024 CLINICAL DATA:  Fall. EXAM: PORTABLE CHEST 1 VIEW COMPARISON:  10/10/2023 FINDINGS: Shallow inspiration. Heart size and pulmonary vascularity are normal for technique. Small right pleural effusion or thickening with atelectasis in the right base. This is similar to previous study. Left lung is clear. No pneumothorax. Mediastinal contours appear intact. Probable nondisplaced fracture of the anterior right fifth rib. Degenerative changes in the shoulders. IMPRESSION: Shallow inspiration. Fluid or thickened pleura in the right costophrenic angle with atelectasis in the right base. Probable fracture of the anterior right fifth rib. Electronically Signed   By: Elsie Gravely M.D.   On: 04/28/2024 15:05   DG Hip Unilat W or Wo Pelvis 2-3 Views Right Result Date: 04/28/2024 EXAM: 2 or 3 VIEW(S) XRAY OF  THE RIGHT HIP 04/28/2024 01:54:14 PM COMPARISON: None available. CLINICAL HISTORY: fall, pain. Pt brought in by [facility]. Reports right hip pain that began 04/22/24 after she stepped off of a step in a precarious manner. Pt was attempting to use the bathroom today and was unable to stand back up, as the pain became much worse. Shortening and rotation is noted of the right leg. CMS intact. Pt is AOx4. She denies any other complaints of pain. [facility] administered 4mg  of Morphine PTA. FINDINGS: BONES AND JOINTS: Fracture through the right femoral neck subcapital fracture. There is varus angulation of the femoral head. No dislocation. The hip joint is maintained. No significant degenerative changes. SOFT TISSUES: The soft tissues are unremarkable. IMPRESSION: 1. Right femoral neck subcapital fracture with varus angulation. No dislocation. Electronically signed by: Norleen Boxer MD 04/28/2024 02:36 PM EDT RP Workstation: [id number]    Review of Systems  Constitutional:  Negative for chills and fever.  HENT:  Positive for hearing loss.   Respiratory:  Negative for shortness of breath.   Cardiovascular:  Negative for chest pain.  Gastrointestinal: Negative.   Endocrine: Negative.   Genitourinary: Negative.   Skin: Negative.   Allergic/Immunologic: Negative.   Neurological: Negative.   Hematological: Negative.  Psychiatric/Behavioral: Negative.     Blood pressure 111/89, pulse 80, temperature 98.2 F (36.8 C), temperature source Oral, resp. rate 18, SpO2 95%. Physical Exam Vitals and nursing note reviewed.  Constitutional:      General: She is not in acute distress.    Appearance: Normal appearance. She is normal weight. She is not ill-appearing, toxic-appearing or diaphoretic.  HENT:     Head: Normocephalic and atraumatic.     Right Ear: External ear normal.     Left Ear: External ear normal.     Nose: Nose normal. No congestion or rhinorrhea.     Mouth/Throat:     Mouth: Mucous membranes are moist.      Pharynx: No oropharyngeal exudate or posterior oropharyngeal erythema.  Eyes:     General: No scleral icterus.       Right eye: No discharge.        Left eye: No discharge.     Extraocular Movements: Extraocular movements intact.     Conjunctiva/sclera: Conjunctivae normal.     Pupils: Pupils are equal, round, and reactive to light.  Cardiovascular:     Rate and Rhythm: Normal rate and regular rhythm.     Heart sounds: Normal heart sounds.  Pulmonary:     Effort: Pulmonary effort is normal. No respiratory distress.     Breath sounds: Normal breath sounds. No stridor.  Chest:     Chest wall: No tenderness.  Abdominal:     General: Abdomen is flat. Bowel sounds are normal. There is no distension.     Palpations: Abdomen is soft. There is no mass.     Tenderness: There is no abdominal tenderness.  Musculoskeletal:     Cervical back: Normal range of motion and neck supple. No rigidity or tenderness.     Comments: I do not see any abnormalities in her upper extremities or left lower extremity  She has tenderness over the right hip with leg deformity decreased range of motion normal muscle tone skin envelope is intact knee and ankle are appropriate position without subluxation or dislocation  Lymphadenopathy:     Cervical: No cervical adenopathy.  Skin:    General: Skin is warm and dry.     Capillary Refill: Capillary refill takes less than 2 seconds.  Neurological:     General: No focal deficit present.     Mental Status: She is alert and oriented to person, place, and time.     Cranial Nerves: No cranial nerve deficit.     Sensory: No sensory deficit.     Motor: No weakness.     Gait: Gait abnormal.     Deep Tendon Reflexes: Reflexes normal.  Psychiatric:        Mood and Affect: Mood normal.        Behavior: Behavior normal.        Thought Content: Thought content normal.        Judgment: Judgment normal.     Assessment/Plan: 83 year old female 6-1/2 to 60-month status  post colectomy for cancer presents with subacute right hip fracture  Based on her age and overall medical status recommend bipolar replacement right hip Patient has consented for bipolar hip replacement in the presence of her son understanding the risks of DVT PE infection leg length discrepancy limp and 6 to 28-month recovery.  Taft Minerva 04/29/2024, 8:15 AM

## 2024-04-29 NOTE — Anesthesia Preprocedure Evaluation (Addendum)
 Anesthesia Evaluation  Patient identified by MRN, date of birth, ID band Patient awake    Reviewed: Allergy & Precautions, H&P , NPO status , Patient's Chart, lab work & pertinent test results  Airway Mallampati: II  TM Distance: >3 FB Neck ROM: Full    Dental  (+) Partial Lower, Partial Upper   Pulmonary neg pulmonary ROS   Pulmonary exam normal breath sounds clear to auscultation       Cardiovascular hypertension, Normal cardiovascular exam Rhythm:Regular Rate:Normal     Neuro/Psych negative neurological ROS  negative psych ROS   GI/Hepatic negative GI ROS, Neg liver ROS,,,  Endo/Other  Hypothyroidism    Renal/GU negative Renal ROS  negative genitourinary   Musculoskeletal  (+) Arthritis ,    Abdominal   Peds negative pediatric ROS (+)  Hematology  (+) Blood dyscrasia, anemia   Anesthesia Other Findings   Reproductive/Obstetrics negative OB ROS                              Anesthesia Physical Anesthesia Plan  ASA: 3 and emergent  Anesthesia Plan: Spinal   Post-op Pain Management:    Induction:   PONV Risk Score and Plan:   Airway Management Planned: Nasal Cannula  Additional Equipment:   Intra-op Plan:   Post-operative Plan:   Informed Consent: I have reviewed the patients History and Physical, chart, labs and discussed the procedure including the risks, benefits and alternatives for the proposed anesthesia with the patient or authorized representative who has indicated his/her understanding and acceptance.     Dental advisory given  Plan Discussed with: CRNA  Anesthesia Plan Comments:          Anesthesia Quick Evaluation

## 2024-04-29 NOTE — Interval H&P Note (Signed)
 History and Physical Interval Note:  04/29/2024 1:16 PM  Stacey Fitzgerald  has presented today for surgery, with the diagnosis of right femoral neck fracture.  The various methods of treatment have been discussed with the patient and family. After consideration of risks, benefits and other options for treatment, the patient has consented to  Procedure(s): HEMIARTHROPLASTY (BIPOLAR) HIP, lateral APPROACH FOR FRACTURE (Right) as a surgical intervention.  The patient's history has been reviewed, patient examined, no change in status, stable for surgery.  I have reviewed the patient's chart and labs.  Questions were answered to the patient's satisfaction.     Taft Minerva

## 2024-04-29 NOTE — TOC CM/SW Note (Signed)
 Transition of Care [facility]) CM/SW Note    Transition of Care [facility]) - Inpatient Brief Assessment   Patient Details  Name: Stacey Fitzgerald MRN: [id number] Date of Birth: Nov 05, 1940  Transition of Care [facility]) CM/SW Contact:    Noreen KATHEE Cleotilde ISRAEL Phone Number: 04/29/2024, 3:15 PM   Clinical Narrative:  Pending PT consult. Patient in Surgery. CSW will follow for recommendation to then discuss with family and patient.  Transition of Care Asessment: Insurance and Status: Insurance coverage has been reviewed Patient has primary care physician: Yes Home environment has been reviewed: Single Family Home Prior level of function:: Independent Prior/Current Home Services: No current home services Social Drivers of Health Review: SDOH reviewed no interventions necessary Readmission risk has been reviewed: Yes Transition of care needs: transition of care needs identified, TOC will continue to follow

## 2024-04-29 NOTE — Progress Notes (Signed)
 Progress Note   Patient: Stacey Fitzgerald FMW:[id number] DOB: Dec 09, 1940 DOA: 04/28/2024     1 DOS: the patient was seen and examined on 04/29/2024   Brief hospital course: Stacey Fitzgerald is a 83 y.o. female with medical history significant of hypertension, hyperlipidemia, hypothyroidism, who lives alone at and was otherwise well until about a week ago when she slipped and fell.  Patient sustained right hip fracture, admitted to [facility] service with orthopedic consultation.  Assessment and Plan: Acute closed right hip fracture Status post fall- Patient will go for orthopedic surgery today. N.p.o. for now until surgical intervention. PT OT evaluation per orthopedic surgeon. Continue pain medications, NWB for now.  Hyperlipidemia: Continue statin therapy.  Hypertension: On lisinopril  therapy.  Hypothyroidism- TSH normal, she is not on any Synthroid  at home.     Out of bed to chair. Incentive spirometry. Nursing supportive care. Fall, aspiration precautions. Diet:  Diet Orders (From admission, onward)     Start     Ordered   04/29/24 0430  Diet NPO time specified Except for: Sips with Meds  Diet effective ____       Question:  Except for  Answer:  Sips with Meds   04/28/24 2258           DVT prophylaxis: SCDs Start: 04/28/24 1754  Level of care: Telemetry   Code Status: Full Code  Subjective: Patient is seen and examined today morning.  Denies any pain while resting.  Did not get out of bed.  Patient is awaiting orthopedic intervention.  Son at bedside.  Physical Exam: Vitals:   04/29/24 0731 04/29/24 0944 04/29/24 1159 04/29/24 1203  BP: 111/89 (!) 158/76 (!) 176/87   Pulse: 80  93   Resp: 18  (!) 26   Temp: 98.2 F (36.8 C)  99.1 F (37.3 C)   TempSrc: Oral  Oral   SpO2:   98%   Weight:    66.9 kg  Height:    5' 8 (1.727 m)    General - Elderly Caucasian female, no apparent distress HEENT - PERRLA, EOMI, atraumatic head, hard of hearing Lung -  Clear, basal rales, no rhonchi, wheezes. Heart - S1, S2 heard, no murmurs, rubs, trace pedal edema. Abdomen - Soft, non tender, bowel sounds good Neuro - Alert, awake and oriented, non focal exam. Skin - Warm and dry.  Data Reviewed:      Latest Ref Rng & Units 04/29/2024    4:29 AM 04/28/2024    2:35 PM 04/15/2024    8:37 AM  CBC  WBC 4.0 - 10.5 K/uL 11.5  12.8  10.1   Hemoglobin 12.0 - 15.0 g/dL 88.1  87.7  87.2   Hematocrit 36.0 - 46.0 % 37.0  37.1  39.2   Platelets 150 - 400 K/uL 299  350  289       Latest Ref Rng & Units 04/29/2024    4:29 AM 04/28/2024    2:35 PM 04/15/2024    8:37 AM  BMP  Glucose 70 - 99 mg/dL 96  898  79   BUN 8 - 23 mg/dL 18  20  21    Creatinine 0.44 - 1.00 mg/dL 9.01  8.97  8.94   Sodium 135 - 145 mmol/L 139  139  141   Potassium 3.5 - 5.1 mmol/L 4.2  4.2  4.1   Chloride 98 - 111 mmol/L 104  103  107   CO2 22 - 32 mmol/L 21  21  25  Calcium  8.9 - 10.3 mg/dL 9.9  89.8  9.3    DG Chest Port 1 View Result Date: 04/28/2024 CLINICAL DATA:  Fall. EXAM: PORTABLE CHEST 1 VIEW COMPARISON:  10/10/2023 FINDINGS: Shallow inspiration. Heart size and pulmonary vascularity are normal for technique. Small right pleural effusion or thickening with atelectasis in the right base. This is similar to previous study. Left lung is clear. No pneumothorax. Mediastinal contours appear intact. Probable nondisplaced fracture of the anterior right fifth rib. Degenerative changes in the shoulders. IMPRESSION: Shallow inspiration. Fluid or thickened pleura in the right costophrenic angle with atelectasis in the right base. Probable fracture of the anterior right fifth rib. Electronically Signed   By: Elsie Gravely M.D.   On: 04/28/2024 15:05   DG Hip Unilat W or Wo Pelvis 2-3 Views Right Result Date: 04/28/2024 EXAM: 2 or 3 VIEW(S) XRAY OF THE RIGHT HIP 04/28/2024 01:54:14 PM COMPARISON: None available. CLINICAL HISTORY: fall, pain. Pt brought in by [facility]. Reports right hip pain  that began 04/22/24 after she stepped off of a step in a precarious manner. Pt was attempting to use the bathroom today and was unable to stand back up, as the pain became much worse. Shortening and rotation is noted of the right leg. CMS intact. Pt is AOx4. She denies any other complaints of pain. [facility] administered 4mg  of Morphine PTA. FINDINGS: BONES AND JOINTS: Fracture through the right femoral neck subcapital fracture. There is varus angulation of the femoral head. No dislocation. The hip joint is maintained. No significant degenerative changes. SOFT TISSUES: The soft tissues are unremarkable. IMPRESSION: 1. Right femoral neck subcapital fracture with varus angulation. No dislocation. Electronically signed by: Norleen Boxer MD 04/28/2024 02:36 PM EDT RP Workstation: [id number]    Family Communication: Discussed with patient, son at bedside.  They understand and agree. All questions answered.  Disposition: Status is: Inpatient Remains inpatient appropriate because: Pain control, orthopedic intervention  Planned Discharge Destination: Home with Home Health     Time spent: 42 minutes  Author: Concepcion Riser, MD 04/29/2024 1:42 PM Secure chat 7am to 7pm For on call review [email or web address].

## 2024-04-29 NOTE — Anesthesia Procedure Notes (Signed)
 Procedure Name: LMA Insertion Date/Time: 04/29/2024 1:42 PM  Performed by: Pheobe Adine CROME, CRNAPre-anesthesia Checklist: Patient identified, Emergency Drugs available, Suction available, Timeout performed and Patient being monitored Patient Re-evaluated:Patient Re-evaluated prior to induction Oxygen Delivery Method: Circle system utilized Preoxygenation: Pre-oxygenation with 100% oxygen Induction Type: IV induction LMA: LMA inserted LMA Size: 4.0 Number of attempts: 1 Placement Confirmation: positive ETCO2, CO2 detector and breath sounds checked- equal and bilateral Tube secured with: Tape Dental Injury: Teeth and Oropharynx as per pre-operative assessment

## 2024-04-30 ENCOUNTER — Encounter (HOSPITAL_COMMUNITY): Payer: Self-pay | Admitting: Orthopedic Surgery

## 2024-04-30 DIAGNOSIS — W19XXXA Unspecified fall, initial encounter: Secondary | ICD-10-CM | POA: Diagnosis not present

## 2024-04-30 DIAGNOSIS — I10 Essential (primary) hypertension: Secondary | ICD-10-CM | POA: Diagnosis not present

## 2024-04-30 DIAGNOSIS — S72001A Fracture of unspecified part of neck of right femur, initial encounter for closed fracture: Secondary | ICD-10-CM | POA: Diagnosis not present

## 2024-04-30 DIAGNOSIS — E782 Mixed hyperlipidemia: Secondary | ICD-10-CM | POA: Diagnosis not present

## 2024-04-30 LAB — CBC
HCT: 33.5 % — ABNORMAL LOW (ref 36.0–46.0)
Hemoglobin: 10.6 g/dL — ABNORMAL LOW (ref 12.0–15.0)
MCH: 28.7 pg (ref 26.0–34.0)
MCHC: 31.6 g/dL (ref 30.0–36.0)
MCV: 90.8 fL (ref 80.0–100.0)
Platelets: 318 K/uL (ref 150–400)
RBC: 3.69 MIL/uL — ABNORMAL LOW (ref 3.87–5.11)
RDW: 14.4 % (ref 11.5–15.5)
WBC: 14.1 K/uL — ABNORMAL HIGH (ref 4.0–10.5)
nRBC: 0 % (ref 0.0–0.2)

## 2024-04-30 NOTE — Plan of Care (Signed)

## 2024-04-30 NOTE — Progress Notes (Signed)
 Progress Note   Patient: Stacey Fitzgerald FMW:[id number] DOB: [birth year] DOA: 04/28/2024     2 DOS: the patient was seen and examined on 04/30/2024   Brief hospital course: Stacey Fitzgerald is a 83 y.o. female with medical history significant of hypertension, hyperlipidemia, hypothyroidism, who lives alone at and was otherwise well until about a week ago when she slipped and fell.  Patient sustained right hip fracture, admitted to [facility] service with orthopedic consultation.  Assessment and Plan: Acute closed right hip fracture Status post fall- S/p orthopedic surgery 04/29/24 bipolar replacement right hip. Follow daily h/h. Continue pain control. WBAT as per ortho. PT OT evaluation for dc planning. TOC working on SNF.  Hyperlipidemia: Continue statin therapy.  Hypertension: On lisinopril  therapy.  Hypothyroidism- TSH normal, she is not on any Synthroid  at home.     Out of bed to chair. Incentive spirometry. Nursing supportive care. Fall, aspiration precautions. Diet:  Diet Orders (From admission, onward)     Start     Ordered   04/29/24 1703  Diet regular Room service appropriate? Yes; Fluid consistency: Thin  Diet effective now       Question Answer Comment  Room service appropriate? Yes   Fluid consistency: Thin      04/29/24 1702           DVT prophylaxis: enoxaparin (LOVENOX) injection 40 mg Start: 04/30/24 0800 SCDs Start: 04/29/24 1703 Place TED hose Start: 04/29/24 1703  Level of care: Telemetry   Code Status: Full Code  Subjective: Patient is seen and examined today morning.  Denies any pain, wishes to work with PT, out of bed.  No family at bedside, eating fair.  Physical Exam: Vitals:   04/29/24 1650 04/29/24 1944 04/29/24 2330 04/30/24 0558  BP: (!) 160/95 129/64 (!) 109/58 (!) 147/70  Pulse: 69 68 80 81  Resp: 20 20 16 20   Temp: 98 F (36.7 C) 98 F (36.7 C)    TempSrc:  Oral    SpO2: 95% 98% 97% 95%  Weight:      Height:         General - Elderly Caucasian female, no apparent distress HEENT - PERRLA, EOMI, atraumatic head, hard of hearing Lung - Clear, basal rales, no rhonchi, wheezes. Heart - S1, S2 heard, no murmurs, rubs, trace pedal edema. Abdomen - Soft, non tender, bowel sounds good Neuro - Alert, awake and oriented, non focal exam. Skin - Warm and dry.  Data Reviewed:      Latest Ref Rng & Units 04/30/2024    6:16 AM 04/29/2024    4:08 PM 04/29/2024    4:29 AM  CBC  WBC 4.0 - 10.5 K/uL 14.1  17.0  11.5   Hemoglobin 12.0 - 15.0 g/dL 89.3  87.7  88.1   Hematocrit 36.0 - 46.0 % 33.5  38.0  37.0   Platelets 150 - 400 K/uL 318  353  299       Latest Ref Rng & Units 04/29/2024    4:08 PM 04/29/2024    4:29 AM 04/28/2024    2:35 PM  BMP  Glucose 70 - 99 mg/dL  96  898   BUN 8 - 23 mg/dL  18  20   Creatinine 9.55 - 1.00 mg/dL 8.94  9.01  8.97   Sodium 135 - 145 mmol/L  139  139   Potassium 3.5 - 5.1 mmol/L  4.2  4.2   Chloride 98 - 111 mmol/L  104  103  CO2 22 - 32 mmol/L  21  21   Calcium  8.9 - 10.3 mg/dL  9.9  89.8    DG HIP UNILAT WITH PELVIS 2-3 VIEWS RIGHT Result Date: 04/29/2024 CLINICAL DATA:  Post right hip replacement. EXAM: DG HIP (WITH OR WITHOUT PELVIS) 2-3V RIGHT COMPARISON:  04/28/2024 FINDINGS: Interval placement of right hip hemiarthroplasty with non cemented component. The component appears well seated. No dislocation or periprosthetic fracture. Skin clips and soft tissue gas are consistent with recent surgery. Pelvis and visualized left hip appear intact. Degenerative changes in the lower lumbar spine. IMPRESSION: Interval placement of right hip hemiarthroplasty. Components appear well seated. Electronically Signed   By: Elsie Gravely M.D.   On: 04/29/2024 17:54   DG Chest Port 1 View Result Date: 04/28/2024 CLINICAL DATA:  Fall. EXAM: PORTABLE CHEST 1 VIEW COMPARISON:  10/10/2023 FINDINGS: Shallow inspiration. Heart size and pulmonary vascularity are normal for technique. Small  right pleural effusion or thickening with atelectasis in the right base. This is similar to previous study. Left lung is clear. No pneumothorax. Mediastinal contours appear intact. Probable nondisplaced fracture of the anterior right fifth rib. Degenerative changes in the shoulders. IMPRESSION: Shallow inspiration. Fluid or thickened pleura in the right costophrenic angle with atelectasis in the right base. Probable fracture of the anterior right fifth rib. Electronically Signed   By: Elsie Gravely M.D.   On: 04/28/2024 15:05   DG Hip Unilat W or Wo Pelvis 2-3 Views Right Result Date: 04/28/2024 EXAM: 2 or 3 VIEW(S) XRAY OF THE RIGHT HIP 04/28/2024 01:54:14 PM COMPARISON: None available. CLINICAL HISTORY: fall, pain. Pt brought in by [facility]. Reports right hip pain that began 04/22/24 after she stepped off of a step in a precarious manner. Pt was attempting to use the bathroom today and was unable to stand back up, as the pain became much worse. Shortening and rotation is noted of the right leg. CMS intact. Pt is AOx4. She denies any other complaints of pain. [facility] administered 4mg  of Morphine PTA. FINDINGS: BONES AND JOINTS: Fracture through the right femoral neck subcapital fracture. There is varus angulation of the femoral head. No dislocation. The hip joint is maintained. No significant degenerative changes. SOFT TISSUES: The soft tissues are unremarkable. IMPRESSION: 1. Right femoral neck subcapital fracture with varus angulation. No dislocation. Electronically signed by: Norleen Boxer MD 04/28/2024 02:36 PM EDT RP Workstation: [id number]    Family Communication: Discussed with patient, she understand and agree. All questions answered.  Disposition: Status is: Inpatient Remains inpatient appropriate because: Pain control, PT/OT  Planned Discharge Destination: Skilled nursing facility     Time spent: 44 minutes  Author: Concepcion Riser, MD 04/30/2024 12:25 PM Secure chat 7am to 7pm For  on call review [email or web address].

## 2024-04-30 NOTE — Plan of Care (Signed)
  Problem: Acute Rehab PT Goals(only PT should resolve) Goal: Pt Will Go Supine/Side To Sit Outcome: Progressing Flowsheets (Taken 04/30/2024 1430) Pt will go Supine/Side to Sit:  with minimal assist  with moderate assist Goal: Patient Will Transfer Sit To/From Stand Outcome: Progressing Flowsheets (Taken 04/30/2024 1430) Patient will transfer sit to/from stand:  with minimal assist  with moderate assist Goal: Pt Will Transfer Bed To Chair/Chair To Bed Outcome: Progressing Flowsheets (Taken 04/30/2024 1430) Pt will Transfer Bed to Chair/Chair to Bed:  with min assist  with mod assist Goal: Pt Will Ambulate Outcome: Progressing Flowsheets (Taken 04/30/2024 1430) Pt will Ambulate:  25 feet  with minimal assist  with moderate assist  with rolling walker   2:30 PM, 04/30/24 Lynwood Music, MPT Physical Therapist with [facility] 336 [phone] office [phone] mobile phone

## 2024-04-30 NOTE — TOC Initial Note (Addendum)
 Transition of Care [facility]) - Initial/Assessment Note    Patient Details  Name: Stacey Fitzgerald MRN: [id number] Date of Birth: 12/29/1940  Transition [facility]) CM/SW Contact:    Noreen KATHEE Pinal, LCSWA Phone Number: 04/30/2024, 2:53 PM  Clinical Narrative:                  CSW spoke with patient and granddaughter at bedside regarding SNF recommendation. Patient and granddaughter, both agreeable and facility choice was [facility], [address], and [place]. Patient shared that she really wanted to go to [facility]. CSW sent referral through [facility]. [facility] was able to offer a bed. Family was updated, bed was accepted, and shara has been started. CSW will continue to follow.   Expected Discharge Plan: Skilled Nursing Facility Barriers to Discharge: Continued Medical Work up   Patient Goals and CMS Choice Patient states their goals for this hospitalization and ongoing recovery are:: DC to SNF for STR rehab CMS Medicare.gov Compare Post Acute Care list provided to:: Patient Choice offered to / list presented to : Patient, Adult Children [place] ownership interest in [facility].provided to:: Patient    Expected Discharge Plan and Services     Post Acute Care Choice: Durable Medical Equipment Living arrangements for the past 2 months: Single Family Home                                      Prior Living Arrangements/Services Living arrangements for the past 2 months: Single Family Home Lives with:: Self Patient language and need for interpreter reviewed:: Yes Do you feel safe going back to the place where you live?: Yes          Current home services: DME Criminal Activity/Legal Involvement Pertinent to Current Situation/Hospitalization: No - Comment as needed  Activities of Daily Living   ADL Screening (condition at time of admission) Independently performs ADLs?: No Does the patient have a NEW difficulty with bathing/dressing/toileting/self-feeding that is expected to last  >3 days?: Yes (Initiates electronic notice to provider for possible OT consult) Does the patient have a NEW difficulty with getting in/out of bed, walking, or climbing stairs that is expected to last >3 days?: Yes (Initiates electronic notice to provider for possible PT consult) Does the patient have a NEW difficulty with communication that is expected to last >3 days?: No Is the patient deaf or have difficulty hearing?: No Does the patient have difficulty seeing, even when wearing glasses/contacts?: No Does the patient have difficulty concentrating, remembering, or making decisions?: No  Permission Sought/Granted      Share Information with NAME: Tahnee and Norlene     Permission granted to share info w Relationship: Patient and granddaughter     Emotional Assessment Appearance:: Appears older than stated age Attitude/Demeanor/Rapport: Engaged, Self-Confident Affect (typically observed): Accepting Orientation: : Oriented to Self, Oriented to Place, Oriented to  Time, Oriented to Situation Alcohol / Substance Use: Not Applicable Psych Involvement: No (comment)  Admission diagnosis:  Closed right hip fracture (HCC) [S72.001A] Closed displaced fracture of right femoral neck (HCC) [S72.001A] Patient Active Problem List   Diagnosis Date Noted   Closed displaced fracture of right femoral neck (HCC) 04/28/2024   Lung nodule 03/11/2024   IDA (iron  deficiency anemia) 03/02/2024   Unspecified protein-calorie malnutrition 10/19/2023   Colon cancer, ascending (HCC) 10/10/2023   Symptomatic anemia 10/08/2023   Neurocognitive deficits 11/29/2022   Memory changes  10/26/2022   Arthritis of left knee 07/07/2021   Uterine procidentia 02/04/2018   Pure hypercholesterolemia 01/28/2015   Essential hypertension 01/28/2015   Hypothyroidism 01/28/2015   Monilial intertrigo 01/28/2015   Rhus dermatitis 01/28/2015   Hernia, rectovaginal 10/20/2013   Climacteric 10/20/2013   Cystocele with prolapse  10/20/2013   PCP:  Dineen Channel, PA-C Pharmacy:   [facility] [phone] - [place], [place] - [address] AT [facility] [address] [place] [address] Phone: [phone] Fax: [phone]     Social Drivers of Health (SDOH) Social History: SDOH Screenings   Food Insecurity: No Food Insecurity (04/28/2024)  Housing: Low Risk  (04/28/2024)  Transportation Needs: No Transportation Needs (04/28/2024)  Utilities: Not At Risk (04/28/2024)  Alcohol Screen: Low Risk  (11/25/2023)  Depression (PHQ2-9): Low Risk  (04/22/2024)  Financial Resource Strain: Low Risk  (03/16/2024)  Physical Activity: Inactive (03/16/2024)  Social Connections: Moderately Isolated (04/28/2024)  Stress: No Stress Concern Present (03/16/2024)  Tobacco Use: Low Risk  (04/29/2024)  Health Literacy: Adequate Health Literacy (11/25/2023)   SDOH Interventions:     Readmission Risk Interventions    04/30/2024    2:50 PM 04/29/2024    3:14 PM 10/16/2023    1:59 PM  Readmission Risk Prevention Plan  Medication Screening  Complete Complete  Transportation Screening Complete Complete Complete  Home Care Screening Complete    Medication Review (RN CM) Complete

## 2024-04-30 NOTE — NC FL2 (Signed)
 [place]  MEDICAID FL2 LEVEL OF CARE FORM     IDENTIFICATION  Patient Name: Stacey Fitzgerald Birthdate: 1941/05/15 Sex: female Admission Date (Current Location): 04/28/2024  [facility] and [place] Number:  [employer] and Address:  [facility],  [address] [place] [address]      Provider Number: [id number]  Attending Physician Name and Address:  Darci Pore, MD  Relative Name and Phone Number:  Ozell Qua (son) [phone]    Current Level of Care: Hospital Recommended Level of Care: Skilled Nursing Facility Prior Approval Number:    Date Approved/Denied:   PASRR Number: [id number]  Discharge Plan: SNF    Current Diagnoses: Patient Active Problem List   Diagnosis Date Noted   Closed displaced fracture of right femoral neck (HCC) 04/28/2024   Lung nodule 03/11/2024   IDA (iron  deficiency anemia) 03/02/2024   Unspecified protein-calorie malnutrition 10/19/2023   Colon cancer, ascending (HCC) 10/10/2023   Symptomatic anemia 10/08/2023   Neurocognitive deficits 11/29/2022   Memory changes 10/26/2022   Arthritis of left knee 07/07/2021   Uterine procidentia 02/04/2018   Pure hypercholesterolemia 01/28/2015   Essential hypertension 01/28/2015   Hypothyroidism 01/28/2015   Monilial intertrigo 01/28/2015   Rhus dermatitis 01/28/2015   Hernia, rectovaginal 10/20/2013   Climacteric 10/20/2013   Cystocele with prolapse 10/20/2013    Orientation RESPIRATION BLADDER Height & Weight     Time, Self, Situation, Place  Normal Continent Weight: 147 lb 7.8 oz (66.9 kg) Height:  5' 8 (172.7 cm)  BEHAVIORAL SYMPTOMS/MOOD NEUROLOGICAL BOWEL NUTRITION STATUS      Continent Diet (Regular)  AMBULATORY STATUS COMMUNICATION OF NEEDS Skin   Extensive Assist Verbally Surgical wounds                       Personal Care Assistance Level of Assistance  Bathing, Feeding, Dressing Bathing Assistance: Maximum assistance Feeding  assistance: Independent Dressing Assistance: Maximum assistance     Functional Limitations Info  Sight, Hearing, Speech Sight Info: Adequate Hearing Info: Impaired Speech Info: Adequate    SPECIAL CARE FACTORS FREQUENCY  PT (By licensed PT), OT (By licensed OT)     PT Frequency: 5 x a week OT Frequency: 5 x a week            Contractures Contractures Info: Not present    Additional Factors Info  Code Status, Allergies Code Status Info: FULL Allergies Info: Cephalexin           Current Medications (04/30/2024):  This is the current hospital active medication list Current Facility-Administered Medications  Medication Dose Route Frequency Provider Last Rate Last Admin   0.9 %  sodium chloride  infusion   Intravenous Continuous Margrette Taft BRAVO, MD 75 mL/hr at 04/30/24 0810 New Bag at 04/30/24 0810   acetaminophen  (TYLENOL ) tablet 325-650 mg  325-650 mg Oral Q6H PRN Margrette Taft BRAVO, MD       acetaminophen  (TYLENOL ) tablet 500 mg  500 mg Oral Q6H Margrette Taft BRAVO, MD   500 mg at 04/30/24 9376   bisacodyl  (DULCOLAX) EC tablet 5 mg  5 mg Oral Daily PRN Margrette Taft BRAVO, MD       docusate sodium  (COLACE) capsule 100 mg  100 mg Oral BID Margrette Taft BRAVO, MD   100 mg at 04/30/24 0957   enoxaparin (LOVENOX) injection 40 mg  40 mg Subcutaneous Q24H Margrette Taft BRAVO, MD   40 mg at 04/30/24 0810   HYDROcodone-acetaminophen  (NORCO) 7.5-325 MG per  tablet 1-2 tablet  1-2 tablet Oral Q4H PRN Harrison, Stanley E, MD       HYDROcodone-acetaminophen  (NORCO/VICODIN) 5-325 MG per tablet 1-2 tablet  1-2 tablet Oral Q4H PRN Harrison, Stanley E, MD   1 tablet at 04/30/24 9167   lisinopril  (ZESTRIL ) tablet 20 mg  20 mg Oral BID Harrison, Stanley E, MD   20 mg at 04/30/24 0957   menthol (CEPACOL) lozenge 3 mg  1 lozenge Oral PRN Harrison, Stanley E, MD       Or   phenol (CHLORASEPTIC) mouth spray 1 spray  1 spray Mouth/Throat PRN Margrette Taft BRAVO, MD       methocarbamol  (ROBAXIN) tablet 500 mg  500 mg Oral Q6H PRN Margrette Taft BRAVO, MD       Or   methocarbamol (ROBAXIN) injection 500 mg  500 mg Intravenous Q6H PRN Harrison, Stanley E, MD       metoCLOPramide (REGLAN) tablet 5-10 mg  5-10 mg Oral Q8H PRN Harrison, Stanley E, MD       Or   metoCLOPramide (REGLAN) injection 5-10 mg  5-10 mg Intravenous Q8H PRN Harrison, Stanley E, MD       morphine (PF) 2 MG/ML injection 0.5-1 mg  0.5-1 mg Intravenous Q2H PRN Margrette Taft BRAVO, MD       ondansetron  (ZOFRAN ) injection 4 mg  4 mg Intravenous Q6H PRN Harrison, Stanley E, MD   4 mg at 04/28/24 1822   polyethylene glycol (MIRALAX / GLYCOLAX) packet 17 g  17 g Oral Daily PRN Margrette Taft BRAVO, MD       simvastatin  (ZOCOR ) tablet 20 mg  20 mg Oral QHS Harrison, Stanley E, MD   20 mg at 04/29/24 2153   traMADol (ULTRAM) tablet 50 mg  50 mg Oral Q6H Harrison, Stanley E, MD   50 mg at 04/30/24 9376     Discharge Medications: Please see discharge summary for a list of discharge medications.  Relevant Imaging Results:  Relevant Lab Results:   Additional Information SSN: [id number] LCSWA

## 2024-04-30 NOTE — Evaluation (Signed)
 Occupational Therapy Evaluation Patient Details Name: Stacey Fitzgerald MRN: [id number] DOB: 1941/07/12 Today's Date: 04/30/2024   History of Present Illness   Stacey Fitzgerald is a 83 y.o. female with medical history significant of hypertension, hyperlipidemia, hypothyroidism, who lives alone at and was otherwise well until about a week ago when she slipped and fell.  According to patient she only had some minimal pain in the right hip but has been able to walk with no issues until today when she started experiencing severe pain involving the left hip and subsequently have not been able to get out of laying posture.  Patient therefore brought in for further management.  At the time she was being seen she admits to pain of 8/10 however improved after giving pain medication in the emergency room.  She denies nausea vomiting abdominal pain chest pain cough or urinary complaints. (per MD)     Clinical Impressions Pt agreeable to OT and PT co-evaluation. Pt independent at baseline but today required min A for bed mobility and mod to max A for transfers using the RW. Pt demonstrates need for mod to max A for lower body tasks based on difficulty donning L LE sock. B UE generally weak with WFL A/ROM. Pt demonstrates much increase in pain with mobility. Pt left in the chair with call bell within reach. Pt will benefit from continued OT in the hospital to increase strength, balance, and endurance for safe ADL's.        If plan is discharge home, recommend the following:   A lot of help with walking and/or transfers;A lot of help with bathing/dressing/bathroom;Assistance with cooking/housework;Assist for transportation;Help with stairs or ramp for entrance     Functional Status Assessment   Patient has had a recent decline in their functional status and demonstrates the ability to make significant improvements in function in a reasonable and predictable amount of time.     Equipment  Recommendations   None recommended by OT             Precautions/Restrictions   Precautions Precautions: Fall Recall of Precautions/Restrictions: Intact Precaution/Restrictions Comments: Direct lateral hip precautions. Restrictions Weight Bearing Restrictions Per Provider Order: Yes RLE Weight Bearing Per Provider Order: Weight bearing as tolerated     Mobility Bed Mobility Overal bed mobility: Needs Assistance Bed Mobility: Supine to Sit     Supine to sit: Min assist     General bed mobility comments: labaored movment; single hand held assist to pull to upright position at EOB.    Transfers Overall transfer level: Needs assistance Equipment used: Rolling walker (2 wheels) Transfers: Sit to/from Stand, Bed to chair/wheelchair/BSC Sit to Stand: Mod assist, Max assist     Step pivot transfers: Mod assist, Max assist     General transfer comment: labored; increased pain in R LE; cuing for safety with RW.      Balance Overall balance assessment: Needs assistance Sitting-balance support: No upper extremity supported, Feet supported Sitting balance-Leahy Scale: Fair Sitting balance - Comments: fair to good seated at EOB   Standing balance support: Bilateral upper extremity supported, During functional activity, Reliant on assistive device for balance Standing balance-Leahy Scale: Poor Standing balance comment: using RW                           ADL either performed or assessed with clinical judgement   ADL Overall ADL's : Needs assistance/impaired     Grooming: Set up;Sitting  Upper Body Bathing: Set up;Sitting   Lower Body Bathing: Moderate assistance;Maximal assistance;Sitting/lateral leans   Upper Body Dressing : Set up;Sitting   Lower Body Dressing: Moderate assistance;Maximal assistance;Sitting/lateral leans Lower Body Dressing Details (indicate cue type and reason): Based on difficulty attempting to doff and don L sock seated in the  recliner. Toilet Transfer: Moderate assistance;Maximal assistance;Stand-pivot;Rolling walker (2 wheels);Ambulation Toilet Transfer Details (indicate cue type and reason): EOB to chair with RW and a few steps forward and backward in the room. Toileting- Clothing Manipulation and Hygiene: Sitting/lateral lean;Moderate assistance;Maximal assistance       Functional mobility during ADLs: Moderate assistance;Maximal assistance;Rolling walker (2 wheels) General ADL Comments: Pt functionally ambulated ~6 feet total forward and backward with RW.     Vision   Vision Assessment?: No apparent visual deficits     Perception Perception: Not tested       Praxis Praxis: Not tested       Pertinent Vitals/Pain Pain Assessment Pain Assessment: Faces Faces Pain Scale: Hurts even more Pain Location: R hip Pain Descriptors / Indicators: Discomfort, Guarding Pain Intervention(s): Limited activity within patient's tolerance, Monitored during session, Repositioned     Extremity/Trunk Assessment Upper Extremity Assessment Upper Extremity Assessment: Generalized weakness   Lower Extremity Assessment Lower Extremity Assessment: Defer to PT evaluation   Cervical / Trunk Assessment Cervical / Trunk Assessment: Kyphotic   Communication Communication Communication: No apparent difficulties   Cognition Arousal: Alert Behavior During Therapy: WFL for tasks assessed/performed Cognition: No apparent impairments                               Following commands: Intact       Cueing  General Comments   Cueing Techniques: Verbal cues;Tactile cues;Visual cues                 Home Living Family/patient expects to be discharged to:: Private residence Living Arrangements: Alone Available Help at Discharge: Family;Available PRN/intermittently (Pt states she may be able to have 24/7 assist.) Type of Home: House Home Access: Stairs to enter [employer] of Steps:  2 Entrance Stairs-Rails: Right Home Layout: Two level Alternate Level Stairs-Number of Steps: 10 Alternate Level Stairs-Rails: None Bathroom Shower/Tub: Producer, television/film/video: Standard Bathroom Accessibility: No   Home Equipment: Agricultural consultant (2 wheels);Cane - single point;Shower seat;BSC/3in1          Prior Functioning/Environment Prior Level of Function : Independent/Modified Independent;Driving             Mobility Comments: Ambualtes mostly inside with use of RW excpet when in the bathroom where the pt uses the [facility] due to RW not fitting in the bathroom. ADLs Comments: independent    OT Problem List: Decreased strength;Decreased activity tolerance;Impaired balance (sitting and/or standing);Pain   OT Treatment/Interventions: Self-care/ADL training;Therapeutic exercise;DME and/or AE instruction;Therapeutic activities;Patient/family education;Balance training      OT Goals(Current goals can be found in the care plan section)   Acute Rehab OT Goals Patient Stated Goal: improve function OT Goal Formulation: With patient Time For Goal Achievement: 05/14/24 Potential to Achieve Goals: Good   OT Frequency:  Min 3X/week    Co-evaluation PT/OT/SLP Co-Evaluation/Treatment: Yes Reason for Co-Treatment: To address functional/ADL transfers   OT goals addressed during session: ADL's and self-care                       End of Session Equipment Utilized During Treatment: Rolling  walker (2 wheels);Gait belt  Activity Tolerance: Patient tolerated treatment well Patient left: in chair;with call bell/phone within reach  OT Visit Diagnosis: Unsteadiness on feet (R26.81);Other abnormalities of gait and mobility (R26.89);Muscle weakness (generalized) (M62.81);History of falling (Z91.81)                Time: 9089-9070 OT Time Calculation (min): 19 min Charges:  OT General Charges $OT Visit: 1 Visit OT Evaluation $OT Eval Low Complexity: 1 Low  Deshunda Thackston OT, MOT  Jayson Person 04/30/2024, 1:11 PM

## 2024-04-30 NOTE — Evaluation (Signed)
 Physical Therapy Evaluation Patient Details Name: Stacey Fitzgerald MRN: [id number] DOB: 23-Feb-1941 Today's Date: 04/30/2024  History of Present Illness  Stacey Fitzgerald is a 83 y.o. female with medical history significant of hypertension, hyperlipidemia, hypothyroidism, who lives alone at and was otherwise well until about a week ago when she slipped and fell.  According to patient she only had some minimal pain in the right hip but has been able to walk with no issues until today when she started experiencing severe pain involving the left hip and subsequently have not been able to get out of laying posture.  Patient therefore brought in for further management.  At the time she was being seen she admits to pain of 8/10 however improved after giving pain medication in the emergency room.  She denies nausea vomiting abdominal pain chest pain cough or urinary complaints.   Clinical Impression  Patient demonstrates slow labored movement for sitting up at bedside with difficulty moving RLE due to pain, unsteady labored movement for transferring to/from chair and limited to a few steps at bedside before having to sit due to weakness, left hip pain and fall risk. Patient tolerated sitting up in chair after therapy. Patient will benefit from continued skilled physical therapy in hospital and recommended venue below to increase strength, balance, endurance for safe ADLs and gait.           If plan is discharge home, recommend the following: A lot of help with bathing/dressing/bathroom;A lot of help with walking and/or transfers;Help with stairs or ramp for entrance;Assist for transportation;Assistance with cooking/housework   Can travel by private vehicle   No    Equipment Recommendations None recommended by PT  Recommendations for Other Services       Functional Status Assessment Patient has had a recent decline in their functional status and demonstrates the ability to make significant  improvements in function in a reasonable and predictable amount of time.     Precautions / Restrictions Precautions Precautions: Fall Recall of Precautions/Restrictions: Intact Precaution/Restrictions Comments: Direct lateral hip precautions. Restrictions Weight Bearing Restrictions Per Provider Order: Yes RLE Weight Bearing Per Provider Order: Weight bearing as tolerated      Mobility  Bed Mobility Overal bed mobility: Needs Assistance Bed Mobility: Supine to Sit     Supine to sit: Min assist     General bed mobility comments: required assistance for moving RLE due to incresing pain    Transfers Overall transfer level: Needs assistance Equipment used: Rolling walker (2 wheels) Transfers: Sit to/from Stand, Bed to chair/wheelchair/BSC Sit to Stand: Mod assist, Max assist   Step pivot transfers: Mod assist, Max assist       General transfer comment: unsteady labored movement with incresed right hip pain    Ambulation/Gait Ambulation/Gait assistance: Mod assist Gait Distance (Feet): 8 Feet Assistive device: Rolling walker (2 wheels) Gait Pattern/deviations: Decreased step length - right, Decreased step length - left, Decreased stride length, Decreased stance time - right, Antalgic, Trunk flexed Gait velocity: slow     General Gait Details: limited to a few slow labored side steps, and steps forward/backwards before having to sit due to fatigue, weakness and increasing right hip pain  Stairs            Wheelchair Mobility     Tilt Bed    Modified Rankin (Stroke Patients Only)       Balance Overall balance assessment: Needs assistance Sitting-balance support: Feet supported, No upper extremity supported Sitting balance-Leahy Scale: Fair  Sitting balance - Comments: fair to good seated at EOB   Standing balance support: Bilateral upper extremity supported, During functional activity, Reliant on assistive device for balance Standing balance-Leahy  Scale: Poor Standing balance comment: using RW                             Pertinent Vitals/Pain Pain Assessment Pain Assessment: Faces Faces Pain Scale: Hurts even more Pain Location: R hip Pain Descriptors / Indicators: Discomfort, Guarding Pain Intervention(s): Limited activity within patient's tolerance, Monitored during session, Premedicated before session, Repositioned    Home Living Family/patient expects to be discharged to:: Private residence Living Arrangements: Alone Available Help at Discharge: Family;Available PRN/intermittently Type of Home: House Home Access: Stairs to enter Entrance Stairs-Rails: Right Entrance Stairs-Number of Steps: 2 Alternate Level Stairs-Number of Steps: 10 Home Layout: Two level Home Equipment: Agricultural consultant (2 wheels);Cane - single point;Shower seat;BSC/3in1      Prior Function Prior Level of Function : Independent/Modified Independent;Driving             Mobility Comments: Ambualtes mostly inside with use of RW excpet when in the bathroom where the pt uses the [facility] due to RW not fitting in the bathroom. ADLs Comments: independent     Extremity/Trunk Assessment   Upper Extremity Assessment Upper Extremity Assessment: Defer to OT evaluation    Lower Extremity Assessment Lower Extremity Assessment: Generalized weakness;RLE deficits/detail RLE Deficits / Details: grossly 3/5 RLE: Unable to fully assess due to pain RLE Sensation: WNL RLE Coordination: WNL    Cervical / Trunk Assessment Cervical / Trunk Assessment: Kyphotic  Communication   Communication Communication: No apparent difficulties    Cognition Arousal: Alert Behavior During Therapy: WFL for tasks assessed/performed   PT - Cognitive impairments: No apparent impairments                         Following commands: Intact       Cueing Cueing Techniques: Verbal cues, Tactile cues, Visual cues     General Comments      Exercises      Assessment/Plan    PT Assessment Patient needs continued PT services  PT Problem List Decreased strength;Decreased activity tolerance;Decreased balance;Decreased mobility;Pain       PT Treatment Interventions DME instruction;Gait training;Stair training;Functional mobility training;Therapeutic activities;Therapeutic exercise;Balance training;Patient/family education    PT Goals (Current goals can be found in the Care Plan section)  Acute Rehab PT Goals Patient Stated Goal: return home after rehab PT Goal Formulation: With patient Time For Goal Achievement: 05/14/24 Potential to Achieve Goals: Good    Frequency Min 4X/week     Co-evaluation PT/OT/SLP Co-Evaluation/Treatment: Yes Reason for Co-Treatment: To address functional/ADL transfers PT goals addressed during session: Mobility/safety with mobility;Balance;Proper use of DME OT goals addressed during session: ADL's and self-care       AM-PAC PT 6 Clicks Mobility  Outcome Measure Help needed turning from your back to your side while in a flat bed without using bedrails?: A Lot Help needed moving from lying on your back to sitting on the side of a flat bed without using bedrails?: A Lot Help needed moving to and from a bed to a chair (including a wheelchair)?: A Lot Help needed standing up from a chair using your arms (e.g., wheelchair or bedside chair)?: A Lot Help needed to walk in hospital room?: A Lot Help needed climbing 3-5 steps with a railing? : Total  6 Click Score: 11    End of Session   Activity Tolerance: Patient tolerated treatment well;Patient limited by fatigue Patient left: in chair;with call bell/phone within reach Nurse Communication: Mobility status PT Visit Diagnosis: Unsteadiness on feet (R26.81);Other abnormalities of gait and mobility (R26.89);Muscle weakness (generalized) (M62.81)    Time: 9090-9072 PT Time Calculation (min) (ACUTE ONLY): 18 min   Charges:   PT Evaluation $PT Eval Low  Complexity: 1 Low PT Treatments $Therapeutic Activity: 8-22 mins PT General Charges $$ ACUTE PT VISIT: 1 Visit         2:28 PM, 04/30/24 Lynwood Music, MPT Physical Therapist with [facility] [phone] office [phone] mobile phone

## 2024-04-30 NOTE — Plan of Care (Signed)
  Problem: Acute Rehab OT Goals (only OT should resolve) Goal: Pt. Will Perform Grooming Flowsheets (Taken 04/30/2024 1313) Pt Will Perform Grooming:  with modified independence  standing Goal: Pt. Will Perform Lower Body Bathing Flowsheets (Taken 04/30/2024 1313) Pt Will Perform Lower Body Bathing:  with modified independence  with adaptive equipment  sitting/lateral leans Goal: Pt. Will Perform Lower Body Dressing Flowsheets (Taken 04/30/2024 1313) Pt Will Perform Lower Body Dressing:  with modified independence  with adaptive equipment  sitting/lateral leans Goal: Pt. Will Transfer To Toilet Flowsheets (Taken 04/30/2024 1313) Pt Will Transfer to Toilet:  with supervision  with contact guard assist  ambulating Goal: Pt. Will Perform Toileting-Clothing Manipulation Flowsheets (Taken 04/30/2024 1313) Pt Will Perform Toileting - Clothing Manipulation and hygiene:  with modified independence  sit to/from stand  sitting/lateral leans Goal: Pt/Caregiver Will Perform Home Exercise Program Flowsheets (Taken 04/30/2024 1313) Pt/caregiver will Perform Home Exercise Program:  Increased strength  Both right and left upper extremity  Independently  Padraig Nhan OT, MOT

## 2024-04-30 NOTE — Anesthesia Postprocedure Evaluation (Signed)
 Anesthesia Post Note  Patient: Stacey Fitzgerald  Procedure(s) Performed: HEMIARTHROPLASTY (BIPOLAR) HIP, lateral APPROACH FOR FRACTURE (Right: Hip)  Patient location during evaluation: PACU Anesthesia Type: General Level of consciousness: awake and alert Pain management: pain level controlled Vital Signs Assessment: post-procedure vital signs reviewed and stable Respiratory status: spontaneous breathing, nonlabored ventilation, respiratory function stable and patient connected to nasal cannula oxygen Cardiovascular status: blood pressure returned to baseline and stable Postop Assessment: no apparent nausea or vomiting Anesthetic complications: no   No notable events documented.   Last Vitals:  Vitals:   04/29/24 2330 04/30/24 0558  BP: (!) 109/58 (!) 147/70  Pulse: 80 81  Resp: 16 20  Temp:    SpO2: 97% 95%    Last Pain:  Vitals:   04/30/24 0032  TempSrc:   PainSc: 0-No pain                 Andrea Limes

## 2024-05-01 DIAGNOSIS — E039 Hypothyroidism, unspecified: Secondary | ICD-10-CM | POA: Diagnosis not present

## 2024-05-01 DIAGNOSIS — D509 Iron deficiency anemia, unspecified: Secondary | ICD-10-CM

## 2024-05-01 DIAGNOSIS — S72001A Fracture of unspecified part of neck of right femur, initial encounter for closed fracture: Secondary | ICD-10-CM | POA: Diagnosis not present

## 2024-05-01 DIAGNOSIS — I10 Essential (primary) hypertension: Secondary | ICD-10-CM | POA: Diagnosis not present

## 2024-05-01 LAB — CBC
HCT: 32 % — ABNORMAL LOW (ref 36.0–46.0)
Hemoglobin: 10.2 g/dL — ABNORMAL LOW (ref 12.0–15.0)
MCH: 28.9 pg (ref 26.0–34.0)
MCHC: 31.9 g/dL (ref 30.0–36.0)
MCV: 90.7 fL (ref 80.0–100.0)
Platelets: 330 K/uL (ref 150–400)
RBC: 3.53 MIL/uL — ABNORMAL LOW (ref 3.87–5.11)
RDW: 14.5 % (ref 11.5–15.5)
WBC: 13 K/uL — ABNORMAL HIGH (ref 4.0–10.5)
nRBC: 0 % (ref 0.0–0.2)

## 2024-05-01 MED ORDER — ASPIRIN 325 MG PO TBEC: 325.0000 mg | DELAYED_RELEASE_TABLET | Freq: Every day | ORAL | 0 refills | Status: AC

## 2024-05-01 MED ORDER — HYDROCODONE-ACETAMINOPHEN 5-325 MG PO TABS: 1.0000 | ORAL_TABLET | Freq: Four times a day (QID) | ORAL | 0 refills | Status: DC | PRN

## 2024-05-01 MED ORDER — METHOCARBAMOL 500 MG PO TABS: 500.0000 mg | ORAL_TABLET | Freq: Three times a day (TID) | ORAL | 0 refills | Status: AC | PRN

## 2024-05-01 MED ORDER — LISINOPRIL 20 MG PO TABS: 20.0000 mg | ORAL_TABLET | Freq: Every day | ORAL | Status: DC

## 2024-05-01 NOTE — Progress Notes (Signed)
 Pt has been up OOB in chair since first rounds this am. Requires assist x2 to stand and FWW to shuffle-pivot. States right leg just feels heavy and hard to move but no notable increase in pain with use.

## 2024-05-01 NOTE — Plan of Care (Signed)
  Problem: Education: Goal: Knowledge of General Education information will improve Description: Including pain rating scale, medication(s)/side effects and non-pharmacologic comfort measures Outcome: Progressing   Problem: Health Behavior/Discharge Planning: Goal: Ability to manage health-related needs will improve Outcome: Progressing   Problem: Clinical Measurements: Goal: Will remain free from infection Outcome: Adequate for Discharge

## 2024-05-01 NOTE — Discharge Summary (Signed)
 Physician Discharge Summary   Patient: Stacey Fitzgerald MRN: [id number] DOB: [birth year]  Admit date:     04/28/2024  Discharge date: 05/01/24  Discharge Physician: Concepcion Riser   PCP: Ostwalt, [name], PA-C   Recommendations at discharge:   PCP follow up in 1 week. Ortho follow up as scheduled.  Discharge Diagnoses: Principal Problem:   Closed displaced fracture of right femoral neck (HCC) Active Problems:   Hypothyroidism   Essential hypertension   IDA (iron  deficiency anemia)  Resolved Problems:   * No resolved hospital problems. [facility] Course: Stacey Fitzgerald is a 83 y.o. female with medical history significant of hypertension, hyperlipidemia, hypothyroidism, who lives alone at and was otherwise well until about a week ago when she slipped and fell.  Patient sustained right hip fracture, admitted to [facility] service with orthopedic consultation.   Assessment and Plan: Acute closed right hip fracture Status post fall- S/p orthopedic surgery 04/29/24 bipolar replacement right hip. WBAT as per ortho. PT OT evaluation advised SNF. Aspirin 325 mg for 30 days for DVT prophylaxis. Norco, muscle relaxants prescribed. PCP or Ortho follow-up as scheduled.   Hyperlipidemia: Continue statin therapy.   Hypertension: Lisinopril  was decreased to once daily therapy.   Hypothyroidism- TSH normal, she is not on any Synthroid  at home.         Consultants: Orthopedic surgery Procedures performed: Bilateral replacement of right hip Disposition: Skilled nursing facility Diet recommendation:  Discharge Diet Orders (From admission, onward)     Start     Ordered   05/01/24 0000  Diet - low sodium heart healthy        05/01/24 1426           Cardiac diet DISCHARGE MEDICATION: Allergies as of 05/01/2024       Reactions   Cephalexin Hives   Patient reports that she is not allergic to this medication but she has not taken it since it was listed as an allergy         Medication List     TAKE these medications    acetaminophen  500 MG tablet Commonly known as: TYLENOL  Take 500 mg by mouth in the morning and at bedtime.   aspirin EC 325 MG tablet Take 1 tablet (325 mg total) by mouth daily.   ferrous sulfate  325 (65 FE) MG tablet Take 1 tablet (325 mg total) by mouth every other day.   HYDROcodone-acetaminophen  5-325 MG tablet Commonly known as: NORCO/VICODIN Take 1 tablet by mouth every 6 (six) hours as needed for moderate pain (pain score 4-6) or severe pain (pain score 7-10).   lisinopril  20 MG tablet Commonly known as: ZESTRIL  Take 1 tablet (20 mg total) by mouth daily. What changed: when to take this   methocarbamol 500 MG tablet Commonly known as: ROBAXIN Take 1 tablet (500 mg total) by mouth every 8 (eight) hours as needed for up to 5 days for muscle spasms.   simvastatin  20 MG tablet Commonly known as: ZOCOR  Take 1 tablet (20 mg total) by mouth at bedtime.   vitamin B-12 250 MCG tablet Commonly known as: CYANOCOBALAMIN Take 250 mcg by mouth daily.               Discharge Care Instructions  (From admission, onward)           Start     Ordered   05/01/24 0000  Leave dressing on - Keep it clean, dry, and intact until clinic visit  05/01/24 1426            Contact information for after-discharge care     Destination     [facility] .   Service: Skilled Nursing Contact information: [address] [place]  [address] [phone]                    Discharge Exam: Filed Weights   04/29/24 1203  Weight: 66.9 kg      05/01/2024   12:54 PM 05/01/2024   10:52 AM 05/01/2024    9:26 AM  Vitals with BMI  Systolic 98 119 125  Diastolic 51 62 52  Pulse 84 94 95    General - Elderly Caucasian female, no apparent distress HEENT - PERRLA, EOMI, atraumatic head, hard of hearing Lung - Clear, basal rales, no rhonchi, wheezes. Heart - S1, S2 heard, no  murmurs, rubs, trace pedal edema. Abdomen - Soft, non tender, bowel sounds good Neuro - Alert, awake and oriented, non focal exam. Skin - Warm and dry.  Condition at discharge: stable  The results of significant diagnostics from this hospitalization (including imaging, microbiology, ancillary and laboratory) are listed below for reference.   Imaging Studies: DG HIP UNILAT WITH PELVIS 2-3 VIEWS RIGHT Result Date: 04/29/2024 CLINICAL DATA:  Post right hip replacement. EXAM: DG HIP (WITH OR WITHOUT PELVIS) 2-3V RIGHT COMPARISON:  04/28/2024 FINDINGS: Interval placement of right hip hemiarthroplasty with non cemented component. The component appears well seated. No dislocation or periprosthetic fracture. Skin clips and soft tissue gas are consistent with recent surgery. Pelvis and visualized left hip appear intact. Degenerative changes in the lower lumbar spine. IMPRESSION: Interval placement of right hip hemiarthroplasty. Components appear well seated. Electronically Signed   By: Elsie Gravely M.D.   On: 04/29/2024 17:54   DG Chest Port 1 View Result Date: 04/28/2024 CLINICAL DATA:  Fall. EXAM: PORTABLE CHEST 1 VIEW COMPARISON:  10/10/2023 FINDINGS: Shallow inspiration. Heart size and pulmonary vascularity are normal for technique. Small right pleural effusion or thickening with atelectasis in the right base. This is similar to previous study. Left lung is clear. No pneumothorax. Mediastinal contours appear intact. Probable nondisplaced fracture of the anterior right fifth rib. Degenerative changes in the shoulders. IMPRESSION: Shallow inspiration. Fluid or thickened pleura in the right costophrenic angle with atelectasis in the right base. Probable fracture of the anterior right fifth rib. Electronically Signed   By: Elsie Gravely M.D.   On: 04/28/2024 15:05   DG Hip Unilat W or Wo Pelvis 2-3 Views Right Result Date: 04/28/2024 EXAM: 2 or 3 VIEW(S) XRAY OF THE RIGHT HIP 04/28/2024 01:54:14 PM  COMPARISON: None available. CLINICAL HISTORY: fall, pain. Pt brought in by [facility]. Reports right hip pain that began 04/22/24 after she stepped off of a step in a precarious manner. Pt was attempting to use the bathroom today and was unable to stand back up, as the pain became much worse. Shortening and rotation is noted of the right leg. CMS intact. Pt is AOx4. She denies any other complaints of pain. [facility] administered 4mg  of Morphine PTA. FINDINGS: BONES AND JOINTS: Fracture through the right femoral neck subcapital fracture. There is varus angulation of the femoral head. No dislocation. The hip joint is maintained. No significant degenerative changes. SOFT TISSUES: The soft tissues are unremarkable. IMPRESSION: 1. Right femoral neck subcapital fracture with varus angulation. No dislocation. Electronically signed by: Norleen Boxer MD 04/28/2024 02:36 PM EDT RP Workstation: [id number]   CT ABDOMEN  PELVIS W CONTRAST Result Date: 04/20/2024 CLINICAL DATA:  Colon cancer, stage II/III, monitor * Tracking Code: BO * EXAM: CT ABDOMEN AND PELVIS WITH CONTRAST TECHNIQUE: Multidetector CT imaging of the abdomen and pelvis was performed using the standard protocol following bolus administration of intravenous contrast. RADIATION DOSE REDUCTION: This exam was performed according to the departmental dose-optimization program which includes automated exposure control, adjustment of the mA and/or kV according to patient size and/or use of iterative reconstruction technique. CONTRAST:  OMNIPAQUE  IOHEXOL  300 MG/ML  SOLN COMPARISON:  Abdominopelvic CT 10/10/2023, abdominal MRI 10/31/2023 and chest CT 11/01/2023. FINDINGS: Lower chest: Clear lung bases. No significant pleural or pericardial effusion. Atherosclerosis of the aorta and coronary arteries with calcifications of the aortic valve. Moderate size hiatal hernia. Hepatobiliary: The liver is normal in density without suspicious focal abnormality. Stable tiny cyst in the  dome of the right hepatic lobe (image 13/2). No evidence of gallstones, gallbladder wall thickening or biliary dilatation. Pancreas: Unremarkable. No pancreatic ductal dilatation or surrounding inflammatory changes. Spleen: Normal in size without suspicious focal abnormality. Adrenals/Urinary Tract: Both adrenal glands appear normal. No evidence of urinary tract calculus, suspicious renal lesion or hydronephrosis. Stable renal cortical and parapelvic cysts for which no specific follow-up imaging is recommended. The bladder appears unremarkable for its degree of distention. Stomach/Bowel: Enteric contrast has passed into the rectum. As above, moderate-size hiatal hernia. The stomach otherwise appears unremarkable for its degree of distention. Postsurgical changes from right hemicolectomy and ileocolonic anastomosis. No evidence of bowel distension, wall thickening or surrounding inflammation. Stable duodenal diverticulum. Vascular/Lymphatic: There are no enlarged abdominal or pelvic lymph nodes. Ill-defined mixed soft tissue and fat density anterior to the right iliac bone on image 47/2 has an appearance most consistent with focal fat necrosis. Aortic and branch vessel atherosclerosis without evidence of aneurysm or large vessel occlusion. Reproductive: Unchanged cystic right adnexal lesion measuring 2.6 cm on image 69/2. There is an adjacent curvilinear calcification, although this finding otherwise appears simple. No suspicious adnexal findings. Vaginal pessary in place. Other: Postsurgical changes as described with suspected fat necrosis in the right mid abdomen. No ascites, peritoneal nodularity or pneumoperitoneum. Musculoskeletal: No acute or significant osseous findings. Multilevel spondylosis associated with a convex left thoracolumbar scoliosis. 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 Asymmetric atrophy of the right gluteus musculature. IMPRESSION: 1. Interval right hemicolectomy and ileocolonic  anastomosis. 2. No evidence of local recurrence or metastatic disease. Probable small focus of fat necrosis in the right retroperitoneum. 3. Stable cystic right adnexal lesion, likely benign. 4. Moderate-size hiatal hernia. 5.  Aortic Atherosclerosis (ICD10-I70.0). Electronically Signed   By: Elsie Perone M.D.   On: 04/20/2024 09:14    Microbiology: Results for orders placed or performed during the hospital encounter of 04/28/24  Surgical pcr screen     Status: None   Collection Time: 04/29/24  4:33 AM   Specimen: Nasal Mucosa; Nasal Swab  Result Value Ref Range Status   MRSA, PCR NEGATIVE NEGATIVE Final   Staphylococcus aureus NEGATIVE NEGATIVE Final    Comment: (NOTE) The Xpert SA Assay (FDA approved for NASAL specimens in patients 49 years of age and older), is one component of a comprehensive surveillance program. It is not intended to diagnose infection nor to guide or monitor treatment. Performed at [facility], [address] [place], [place] [address]     Labs: CBC: Recent Labs  Lab 04/28/24 1435 04/29/24 0429 04/29/24 1608 04/30/24 0616 05/01/24 0518  WBC 12.8* 11.5* 17.0* 14.1* 13.0*  NEUTROABS  9.9*  --   --   --   --   HGB 12.2 11.8* 12.2 10.6* 10.2*  HCT 37.1 37.0 38.0 33.5* 32.0*  MCV 89.0 90.7 91.3 90.8 90.7  PLT 350 299 353 318 330   Basic Metabolic Panel: Recent Labs  Lab 04/28/24 1435 04/29/24 0429 04/29/24 1608  NA 139 139  --   K 4.2 4.2  --   CL 103 104  --   CO2 21* 21*  --   GLUCOSE 101* 96  --   BUN 20 18  --   CREATININE 1.02* 0.98 1.05*  CALCIUM  10.1 9.9  --   MG 2.1  --   --    Liver Function Tests: No results for input(s): AST, ALT, ALKPHOS, BILITOT, PROT, ALBUMIN in the last 168 hours. CBG: No results for input(s): GLUCAP in the last 168 hours.  Discharge time spent: 35 minutes.  Signed: Concepcion Riser, MD [facility] 05/01/2024

## 2024-05-01 NOTE — Progress Notes (Signed)
 Pt transported to [facility] via WC by unit 300 staff members. Pt's belongings transported by pt's son Garrel.

## 2024-05-01 NOTE — TOC Transition Note (Signed)
 Transition of Care [facility]) - Discharge Note   Patient Details  Name: Stacey Fitzgerald MRN: [id number] Date of Birth: 1941-03-25  [facility]) CM/SW Contact:  Noreen KATHEE Cleotilde ISRAEL Phone Number: 05/01/2024, 2:46 PM   Clinical Narrative:     Patient shara came back approved. CSW updated PNC about auth and patient being ready to come today. MD working on DC paperwork. Son Ozell was updated on DC today. CSW signing off.   Final next level of care: Skilled Nursing Facility Barriers to Discharge: Barriers Resolved   Patient Goals and CMS Choice Patient states their goals for this hospitalization and ongoing recovery are:: Dc to [facility] for short-term rehab CMS Medicare.gov Compare Post Acute Care list provided to:: Patient Represenative (must comment) Tatiana- son) Choice offered to / list presented to : Adult Children [facility] ownership interest in [facility].provided to:: Patient    Discharge Placement              Patient chooses bed at: [facility] Patient to be transferred to facility by: staff Name of family member notified: Ozell - son Patient and family notified of of transfer: 05/01/24  Discharge Plan and Services Additional resources added to the After Visit Summary for       Post Acute Care Choice: Durable Medical Equipment                               Social Drivers of Health (SDOH) Interventions SDOH Screenings   Food Insecurity: No Food Insecurity (04/28/2024)  Housing: Low Risk  (04/28/2024)  Transportation Needs: No Transportation Needs (04/28/2024)  Utilities: Not At Risk (04/28/2024)  Alcohol Screen: Low Risk  (11/25/2023)  Depression (PHQ2-9): Low Risk  (04/22/2024)  Financial Resource Strain: Low Risk  (03/16/2024)  Physical Activity: Inactive (03/16/2024)  Social Connections: Moderately Isolated (04/28/2024)  Stress: No Stress Concern Present (03/16/2024)  Tobacco Use: Low Risk  (04/29/2024)  Health Literacy:  Adequate Health Literacy (11/25/2023)     Readmission Risk Interventions    05/01/2024    2:12 PM 04/30/2024    2:50 PM 04/29/2024    3:14 PM  Readmission Risk Prevention Plan  Medication Screening   Complete  Transportation Screening Complete Complete Complete  Home Care Screening Complete Complete   Medication Review (RN CM) Complete Complete

## 2024-05-01 NOTE — Progress Notes (Signed)
 Pt report given to staff LPN at [facility]. Pt going to room 135. Pt and son aware.

## 2024-05-01 NOTE — Care Management Important Message (Signed)
 Important Message  Patient Details  Name: Stacey Fitzgerald MRN: [id number] Date of Birth: 07-May-1941   Important Message Given:  Yes - Medicare IM     Stacey Fitzgerald Stacey Fitzgerald 05/01/2024, 4:17 PM

## 2024-05-04 ENCOUNTER — Encounter: Payer: Self-pay | Admitting: Adult Health

## 2024-05-04 ENCOUNTER — Non-Acute Institutional Stay (SKILLED_NURSING_FACILITY): Admitting: Adult Health

## 2024-05-04 DIAGNOSIS — E039 Hypothyroidism, unspecified: Secondary | ICD-10-CM | POA: Diagnosis not present

## 2024-05-04 DIAGNOSIS — I700 Atherosclerosis of aorta: Secondary | ICD-10-CM | POA: Insufficient documentation

## 2024-05-04 DIAGNOSIS — E7800 Pure hypercholesterolemia, unspecified: Secondary | ICD-10-CM

## 2024-05-04 DIAGNOSIS — R29818 Other symptoms and signs involving the nervous system: Secondary | ICD-10-CM

## 2024-05-04 DIAGNOSIS — D509 Iron deficiency anemia, unspecified: Secondary | ICD-10-CM

## 2024-05-04 DIAGNOSIS — S72001A Fracture of unspecified part of neck of right femur, initial encounter for closed fracture: Secondary | ICD-10-CM | POA: Diagnosis not present

## 2024-05-04 DIAGNOSIS — I10 Essential (primary) hypertension: Secondary | ICD-10-CM | POA: Diagnosis not present

## 2024-05-04 DIAGNOSIS — E441 Mild protein-calorie malnutrition: Secondary | ICD-10-CM

## 2024-05-04 DIAGNOSIS — C182 Malignant neoplasm of ascending colon: Secondary | ICD-10-CM

## 2024-05-04 DIAGNOSIS — R4189 Other symptoms and signs involving cognitive functions and awareness: Secondary | ICD-10-CM

## 2024-05-04 NOTE — Progress Notes (Signed)
 Location:  [facility] Room Number: 135 Place of Service:  SNF (31)   CODE STATUS: full code   Allergies  Allergen Reactions   Cephalexin Hives    Patient reports that she is not allergic to this medication but she has not taken it since it was listed as an allergy    Chief Complaint  Patient presents with   Hospitalization Follow-up    HPI:  She is a 83 year old woman who has been hospitalized from 04-28-24 through 05-01-24. Her past medical history includes: hypertension; hypothyroidism; hyperlipidemia. She presented to the ED after sustaining a mechanical fall. She had a right bipolar hip replacement on 04-29-24. She is weight bearing as tolerated. She is presently denying any pain. She is here for short term rehab with her goal to return back home. She will continue to be followed for her chronic illnesses including: Neurocognitive deficits        Mild protein calorie malnutrition:    Pure hypercholesterolemia:   Past Medical History:  Diagnosis Date   Arthritis    Blood transfusion without reported diagnosis    10/09/23   Cancer (HCC)    10/10/23   Female cystocele    Hyperlipidemia    Hypertension    Hypothyroidism    Menopausal state    Obesity, unspecified    Pessary maintenance    Rectocele     Past Surgical History:  Procedure Laterality Date   ABDOMINAL HYSTERECTOMY     BREAST EXCISIONAL BIOPSY Left 1972   benign   BREAST SURGERY     benign cyst   COLON SURGERY  10/11/2023   COLONOSCOPY     COLONOSCOPY N/A 10/09/2023   Procedure: COLONOSCOPY;  Surgeon: Shaaron Lamar HERO, MD;  [facility]: [facility];  Service: Endoscopy;  Laterality: N/A;   COLONOSCOPY WITH PROPOFOL  N/A 11/23/2019   Procedure: COLONOSCOPY WITH PROPOFOL ;  Surgeon: Toledo, Ladell POUR, MD;  Location: [facility] ENDOSCOPY;  Service: Gastroenterology;  Laterality: N/A;   ESOPHAGOGASTRODUODENOSCOPY N/A 10/09/2023   Procedure: EGD (ESOPHAGOGASTRODUODENOSCOPY);  Surgeon: Shaaron Lamar HERO, MD;  Location: [facility];  Service: Endoscopy;  Laterality: N/A;   HIP ARTHROPLASTY Right 04/29/2024   Procedure: HEMIARTHROPLASTY (BIPOLAR) HIP, lateral APPROACH FOR FRACTURE;  Surgeon: Margrette Taft BRAVO, MD;  Location: [facility] ORS;  Service: Orthopedics;  Laterality: Right;   miscarrage     TUBAL LIGATION     uterine pessary      Social History   Socioeconomic History   Marital status: Married    Spouse name: Not on file   Number of children: 2   Years of education: Not on file   Highest education level: 12th grade  Occupational History   Occupation: retired  Tobacco Use   Smoking status: Never   Smokeless tobacco: Never  Vaping Use   Vaping status: Never Used  Substance and Sexual Activity   Alcohol use: Not Currently   Drug use: No   Sexual activity: Not Currently  Other Topics Concern   Not on file  Social History Narrative   Not on file   Social Drivers of Health   Financial Resource Strain: Low Risk  (03/16/2024)   Overall Financial Resource Strain (CARDIA)    Difficulty of Paying Living Expenses: Not hard at all  Food Insecurity: No Food Insecurity (04/28/2024)   Hunger Vital Sign    Worried About Running Out of Food in the Last Year: Never true    Ran Out of Food  in the Last Year: Never true  Transportation Needs: No Transportation Needs (04/28/2024)   PRAPARE - Administrator, Civil Service (Medical): No    Lack of Transportation (Non-Medical): No  Physical Activity: Inactive (03/16/2024)   Exercise Vital Sign    Days of Exercise per Week: 0 days    Minutes of Exercise per Session: Not on file  Stress: No Stress Concern Present (03/16/2024)   [employer] of Occupational Health - Occupational Stress Questionnaire    Feeling of Stress: Only a little  Social Connections: Moderately Isolated (04/28/2024)   Social Connection and Isolation Panel    Frequency of Communication with Friends and Family: Three times a week    Frequency of  Social Gatherings with Friends and Family: Once a week    Attends Religious Services: More than 4 times per year    Active Member of [employer] or Organizations: No    Attends Banker Meetings: Never    Marital Status: Widowed  Intimate Partner Violence: Not At Risk (04/28/2024)   Humiliation, Afraid, Rape, and Kick questionnaire    Fear of Current or Ex-Partner: No    Emotionally Abused: No    Physically Abused: No    Sexually Abused: No   Family History  Problem Relation Age of Onset   Diabetes Mother    Seizures Mother    CVA Father    Dementia Brother    Cancer Brother    Diabetes Maternal Grandfather    Mental illness Sister    Breast cancer Neg Hx       VITAL SIGNS BP (!) 142/68 (BP Location: Right Arm)   Pulse 76   Temp 98.2 F (36.8 C)   Resp 20   Ht 5' 8 (1.727 m)   Wt 149 lb 3.2 oz (67.7 kg)   SpO2 96%   BMI 22.69 kg/m   Outpatient Encounter Medications as of 05/04/2024  Medication Sig   acetaminophen  (TYLENOL ) 500 MG tablet Take 500 mg by mouth in the morning and at bedtime.   aspirin EC 325 MG tablet Take 1 tablet (325 mg total) by mouth daily.   cyanocobalamin 1000 MCG tablet Take 1,000 mcg by mouth daily.   ferrous sulfate  325 (65 FE) MG tablet Take 1 tablet (325 mg total) by mouth every other day.   HYDROcodone-acetaminophen  (NORCO/VICODIN) 5-325 MG tablet Take 1 tablet by mouth every 6 (six) hours as needed for moderate pain (pain score 4-6) or severe pain (pain score 7-10).   lisinopril  (ZESTRIL ) 20 MG tablet Take 1 tablet (20 mg total) by mouth daily.   methocarbamol (ROBAXIN) 500 MG tablet Take 1 tablet (500 mg total) by mouth every 8 (eight) hours as needed for up to 5 days for muscle spasms.   simvastatin  (ZOCOR ) 20 MG tablet Take 1 tablet (20 mg total) by mouth at bedtime.   [DISCONTINUED] vitamin B-12 (CYANOCOBALAMIN) 250 MCG tablet Take 250 mcg by mouth daily.   No facility-administered encounter medications on file as of 05/04/2024.      SIGNIFICANT DIAGNOSTIC EXAMS  LABS  04-28-24: wbc 12.8; hgb 12.2; hct 37.1; mcv 89.0 pt 350; glucose 101; bun 20; creat 1.02; k+ 4.2; na++ 139; ca 10.1; gfr 55; tsh 3.250 05-01-24: wbc 13.0; hgb 10.2; hct 32.0; mcv 90.7 plt 330   Review of Systems  Constitutional:  Negative for malaise/fatigue.  Respiratory:  Negative for cough and shortness of breath.   Cardiovascular:  Negative for chest pain, palpitations and leg swelling.  Gastrointestinal:  Negative for abdominal pain, constipation and heartburn.  Musculoskeletal:  Negative for back pain, joint pain and myalgias.  Skin: Negative.   Neurological:  Negative for dizziness.  Psychiatric/Behavioral:  The patient is not nervous/anxious.    Physical Exam Constitutional:      General: She is not in acute distress.    Appearance: She is well-developed. She is not diaphoretic.  Neck:     Thyroid : No thyromegaly.  Cardiovascular:     Rate and Rhythm: Normal rate and regular rhythm.     Heart sounds: Normal heart sounds.  Pulmonary:     Effort: Pulmonary effort is normal. No respiratory distress.     Breath sounds: Normal breath sounds.  Abdominal:     General: Bowel sounds are normal. There is no distension.     Palpations: Abdomen is soft.     Tenderness: There is no abdominal tenderness.  Musculoskeletal:        General: Normal range of motion.     Cervical back: Neck supple.     Right lower leg: No edema.     Left lower leg: No edema.     Comments: Status post 04-29-24: bipolar right hip replacement  Lymphadenopathy:     Cervical: No cervical adenopathy.  Skin:    General: Skin is warm and dry.  Neurological:     Mental Status: She is alert and oriented to person, place, and time.     ASSESSMENT/ PLAN:  TODAY  Closed displaced fracture of right femoral neck: is status post right bipolar hip replacement. Will continue therapy as directed will follow up with orthopedics. Will continue asa 325 mg for 30 days; will  continue vicodin 5/325 mg every 6 hours through 05-07-24.   2. Colon cancer ascending: is status post surgery 09/2023.   3. Essential hypertension: b/p 142/68: will continue lisinopril  20 mg daily   4. Hypothyroidism unspecified type: tsh 3.250 is presently not on medications  5. Neurocognitive deficits  6. Mild protein calorie malnutrition: will continue supplements as directed  7. Pure hypercholesterolemia: will continue zocor  20 mg daily  8. Iron  deficiency anemia unspecified iron  deficiency anemia type: hgb 10.2; will continue iron  three days weekly.   9. Aortic atherosclerosis (ct 04-15-24) is on statin  Will repeat hgb /hct     Barnie Seip NP [facility]  call [phone]

## 2024-05-05 ENCOUNTER — Non-Acute Institutional Stay (SKILLED_NURSING_FACILITY): Admitting: Internal Medicine

## 2024-05-05 ENCOUNTER — Encounter: Payer: Self-pay | Admitting: Internal Medicine

## 2024-05-05 DIAGNOSIS — D508 Other iron deficiency anemias: Secondary | ICD-10-CM

## 2024-05-05 DIAGNOSIS — E441 Mild protein-calorie malnutrition: Secondary | ICD-10-CM | POA: Diagnosis not present

## 2024-05-05 DIAGNOSIS — E039 Hypothyroidism, unspecified: Secondary | ICD-10-CM

## 2024-05-05 DIAGNOSIS — S72001A Fracture of unspecified part of neck of right femur, initial encounter for closed fracture: Secondary | ICD-10-CM | POA: Diagnosis not present

## 2024-05-05 NOTE — Progress Notes (Signed)
 NURSING HOME LOCATION:  [facility] ROOM NUMBER:  135 P  CODE STATUS: Full code  PCP: [name], PA-C  This is a comprehensive admission note to this SNFperformed on this date less than 30 days from date of admission. Included are preadmission medical/surgical history; reconciled medication list; family history; social history and comprehensive review of systems.  Corrections and additions to the records were documented. Comprehensive physical exam was also performed. Additionally a clinical summary was entered for each active diagnosis pertinent to this admission in the Problem List to enhance continuity of care.  HPI: She was hospitalized 10/7 - 04/28/2024 for closed displaced fracture of the right femoral neck sustained in a mechanical fall.  Bipolar replacement was performed 7/8.Postop weightbearing as tolerated was recommended with DVT prophylaxis and full dose aspirin. While hospitalized peak creatinine was 1.05 and nadir GFR 53.  At admission H/H was 12.2/37.1; nadir values were 10.2/32.  TSH was therapeutic.     PT/OT consulted and recommended SNF placement for rehab.  Past medical and surgical history: Includes history of dyslipidemia; essential hypertension; hypothyroidism; history of rectocele; history of colon cancer; &protein/caloric malnutrition. Surgeries and procedures include abdominal hysterectomy; colonoscopy; EGD; breast biopsy; and placement of uterine pessary.  Family history: reviewed, non contributory due to advanced age.  Social history: Currently nondrinker; non-smoker.   Review of systems: She denied any neurologic or cardiac prodrome prior to the fall.  After feeding her neighbor's dog while he was out of town; she stepped off a 2 foot porch and fell.  She was able to get up and continue her activities until 3 days later the pain was so severe that she could not mobilize adequately. At this time she denies any pain.  She has never taken a  bone building therapy.  Constitutional: No fever, significant weight change, fatigue  Eyes: No redness, discharge, pain, vision change ENT/mouth: No nasal congestion, purulent discharge, earache, change in hearing, sore throat  Cardiovascular: No chest pain, palpitations, paroxysmal nocturnal dyspnea, claudication, edema  Respiratory: No cough, sputum production, hemoptysis, DOE, significant snoring, apnea Gastrointestinal: No heartburn, dysphagia, abdominal pain, nausea /vomiting, rectal bleeding, melena, change in bowels Genitourinary: No dysuria, hematuria, pyuria, incontinence, nocturia Musculoskeletal: No joint stiffness, joint swelling Dermatologic: No rash, pruritus, change in appearance of skin Neurologic: No dizziness, headache, syncope, seizures, numbness, tingling Psychiatric: No significant anxiety, depression, insomnia, anorexia Endocrine: No change in hair/skin/nails, excessive thirst, excessive hunger, excessive urination  Hematologic/lymphatic: No significant bruising, lymphadenopathy, abnormal bleeding Allergy/immunology: No itchy/watery eyes, significant sneezing, urticaria, angioedema  Physical exam:  Pertinent or positive findings: She appears her age and suboptimally nourished.  Hair is somewhat disheveled.  Eyebrows are decreased in density.  She has bilateral ptosis.  Lower lids are slightly puffy.  Her voice is slightly hyponasal.  She has upper and lower partials.  There is accentuation of the thoracic spine curvature.  She has an occasional premature beat.  Breath sounds are normal.  She has trace edema at the sock line.  Pedal pulses are not palpable.  Interosseous wasting of the hands is present.  She has scattered minimal ecchymoses over the upper extremities.  General appearance: no acute distress, increased work of breathing is present.   Lymphatic: No lymphadenopathy about the head, neck, axilla. Eyes: No conjunctival inflammation or lid edema is present. There is  no scleral icterus. Ears:  External ear exam shows no significant lesions or deformities.   Nose:  External nasal examination shows no deformity or  inflammation. Nasal mucosa are pink and moist without lesions, exudates Neck:  No thyromegaly, masses, tenderness noted.    Heart:  No gallop, murmur, click, rub.  Lungs:  without wheezes, rhonchi, rales, rubs. Abdomen: Bowel sounds are normal.  Abdomen is soft and nontender with no organomegaly, hernias, masses. GU: Deferred  Extremities:  No cyanosis, clubbing. Neurologic exam: Balance, Rhomberg, finger to nose testing could not be completed due to clinical state Skin: Warm & dry w/o tenting. No significant  rash.  See clinical summary under each active problem in the Problem List with associated updated therapeutic plan :  Closed displaced fracture of right femoral neck (HCC) History suggests fragility fracture.  No DEXA on record and patient denies any prior bone building therapy.  Consideration for such deferred to her PCP. PT/OT at SNF tolerated.  Hypothyroidism TSH is current and therapeutic.  No no subclinical hyperthyroidism present as contributing factor to osteoporosis.  No change indicated.  IDA (iron  deficiency anemia) As expected there was progression of her anemia following bipolar placement of  right hip on 10/8.  Preop H/H was 12.2/37.7 with postop values of 10.2/32.  Anemia is asymptomatic.  No bleeding dyscrasias reported.  Continue to monitor.  DVT prophylaxis is full dose aspirin. Continue iron  supplementation.  Unspecified protein-calorie malnutrition Total protein 7.1 & albumin 4.0 on 04/15/24.IOW of hands on exam. Nutritionist to assess @ SNF.

## 2024-05-05 NOTE — Assessment & Plan Note (Addendum)
 History suggests fragility fracture.  No DEXA on record and patient denies any prior bone building therapy.  Consideration for such deferred to her PCP. PT/OT at SNF tolerated.

## 2024-05-05 NOTE — Assessment & Plan Note (Addendum)
 As expected there was progression of her anemia following bipolar placement of  right hip on 10/8.  Preop H/H was 12.2/37.7 with postop values of 10.2/32.  Anemia is asymptomatic.  No bleeding dyscrasias reported.  Continue to monitor.  DVT prophylaxis is full dose aspirin. Continue iron  supplementation.

## 2024-05-05 NOTE — Assessment & Plan Note (Signed)
 TSH is current and therapeutic.  No no subclinical hyperthyroidism present as contributing factor to osteoporosis.  No change indicated.

## 2024-05-05 NOTE — Patient Instructions (Signed)
 See assessment and plan under each diagnosis in the problem list and acutely for this visit

## 2024-05-05 NOTE — Assessment & Plan Note (Addendum)
 Total protein 7.1 & albumin 4.0 on 04/15/24.IOW of hands on exam. Nutritionist to assess @ SNF.

## 2024-05-07 ENCOUNTER — Other Ambulatory Visit (HOSPITAL_COMMUNITY)
Admission: RE | Admit: 2024-05-07 | Discharge: 2024-05-07 | Disposition: A | Discharge status: Home / Self Care | Source: Skilled Nursing Facility | Attending: Adult Health | Admitting: Adult Health

## 2024-05-07 DIAGNOSIS — D500 Iron deficiency anemia secondary to blood loss (chronic): Secondary | ICD-10-CM | POA: Insufficient documentation

## 2024-05-07 LAB — HEMOGLOBIN AND HEMATOCRIT, BLOOD
HCT: 31.7 % — ABNORMAL LOW (ref 36.0–46.0)
Hemoglobin: 10.2 g/dL — ABNORMAL LOW (ref 12.0–15.0)

## 2024-05-15 ENCOUNTER — Other Ambulatory Visit: Payer: Self-pay | Admitting: Oncology

## 2024-05-15 ENCOUNTER — Encounter: Payer: Self-pay | Admitting: Adult Health

## 2024-05-15 ENCOUNTER — Other Ambulatory Visit: Payer: Self-pay | Admitting: Adult Health

## 2024-05-15 ENCOUNTER — Non-Acute Institutional Stay (SKILLED_NURSING_FACILITY): Payer: Self-pay | Admitting: Adult Health

## 2024-05-15 DIAGNOSIS — R4189 Other symptoms and signs involving cognitive functions and awareness: Secondary | ICD-10-CM

## 2024-05-15 DIAGNOSIS — I10 Essential (primary) hypertension: Secondary | ICD-10-CM

## 2024-05-15 DIAGNOSIS — R29818 Other symptoms and signs involving the nervous system: Secondary | ICD-10-CM

## 2024-05-15 DIAGNOSIS — S72001A Fracture of unspecified part of neck of right femur, initial encounter for closed fracture: Secondary | ICD-10-CM | POA: Diagnosis not present

## 2024-05-15 MED ORDER — SIMVASTATIN 20 MG PO TABS: 20.0000 mg | ORAL_TABLET | Freq: Every day | ORAL | 0 refills | Status: DC

## 2024-05-15 MED ORDER — LISINOPRIL 20 MG PO TABS: 20.0000 mg | ORAL_TABLET | Freq: Every day | ORAL | 0 refills | Status: DC

## 2024-05-15 MED ORDER — FERROUS SULFATE 325 (65 FE) MG PO TABS: 325.0000 mg | ORAL_TABLET | ORAL | 0 refills | Status: DC

## 2024-05-15 NOTE — Progress Notes (Signed)
 Location:  [facility] Room Number: 135 Place of Service:  SNF (31)   CODE STATUS: full code   Allergies  Allergen Reactions   Cephalexin Hives    Patient reports that she is not allergic to this medication but she has not taken it since it was listed as an allergy    Chief Complaint  Patient presents with   Acute Visit    Care plan meeting     HPI:  We have come together for her care plan meeting. Family present. BIMS 11/15 mood 0/30. SLUMS 10/30.Is using wheelchair without falls. She requires moderate to dependent assist with her adl care. She is frequently incontinent of bladder and bowel. Dietary: regular diet setup for meals; appetite 26-100% weight is 144.8 pounds.   Therapy: ambulate 150 feet with rolling walker at supervision; upper body supervision; lower body contact guard; transfers supervision; supervision brp. Difficulty with new information and working with medication management. Activities: does attend. She will continue to be followed for her chronic illnesses including: Neurocognitive deficits    Closed displaced fracture of right femoral neck    Essential hypertension. She is being discharged to home with home health for pt/ot. Does not need any dme.   Past Medical History:  Diagnosis Date   Arthritis    Blood transfusion without reported diagnosis    10/09/23   Cancer (HCC)    10/10/23   Female cystocele    Hyperlipidemia    Hypertension    Hypothyroidism    Menopausal state    Obesity, unspecified    Pessary maintenance    Rectocele     Past Surgical History:  Procedure Laterality Date   ABDOMINAL HYSTERECTOMY     BREAST EXCISIONAL BIOPSY Left 1972   benign   BREAST SURGERY     benign cyst   COLON SURGERY  10/11/2023   COLONOSCOPY     COLONOSCOPY N/A 10/09/2023   Procedure: COLONOSCOPY;  Surgeon: Shaaron Lamar HERO, MD;  Location: [facility];  Service: Endoscopy;  Laterality: N/A;   COLONOSCOPY WITH PROPOFOL  N/A 11/23/2019    Procedure: COLONOSCOPY WITH PROPOFOL ;  Surgeon: Toledo, Ladell POUR, MD;  Location: [facility] END[facility];  Service: Gastroenterology;  Laterality: N/A;   ESOPHAGOGASTRODUODENOSCOPY N/A 10/09/2023   Procedure: EGD (ESOPHAGOGASTRODUODENOSCOPY);  Surgeon: Shaaron Lamar HERO, MD;  Location: [facility];  Service: Endoscopy;  Laterality: N/A;   HIP ARTHROPLASTY Right 04/29/2024   Procedure: HEMIARTHROPLASTY (BIPOLAR) HIP, lateral APPROACH FOR FRACTURE;  Surgeon: Margrette Taft BRAVO, MD;  Location: [facility];  Service: Orthopedics;  Laterality: Right;   miscarrage     TUBAL LIGATION     uterine pessary      Social History   Socioeconomic History   Marital status: Married    Spouse name: Not on file   Number of children: 2   Years of education: Not on file   Highest education level: 12th grade  Occupational History   Occupation: retired  Tobacco Use   Smoking status: Never   Smokeless tobacco: Never  Vaping Use   Vaping status: Never Used  Substance and Sexual Activity   Alcohol use: Not Currently   Drug use: No   Sexual activity: Not Currently  Other Topics Concern   Not on file  Social History Narrative   Not on file   Social Drivers of Health   Financial Resource Strain: Low Risk  (03/16/2024)   Overall Financial Resource Strain (CARDIA)    Difficulty of Paying Living  Expenses: Not hard at all  Food Insecurity: No Food Insecurity (04/28/2024)   Hunger Vital Sign    Worried About Running Out of Food in the Last Year: Never true    Ran Out of Food in the Last Year: Never true  Transportation Needs: No Transportation Needs (04/28/2024)   PRAPARE - Administrator, Civil Service (Medical): No    Lack of Transportation (Non-Medical): No  Physical Activity: Inactive (03/16/2024)   Exercise Vital Sign    Days of Exercise per Week: 0 days    Minutes of Exercise per Session: Not on file  Stress: No Stress Concern Present (03/16/2024)   [employer] of Occupational Health -  Occupational Stress Questionnaire    Feeling of Stress: Only a little  Social Connections: Moderately Isolated (04/28/2024)   Social Connection and Isolation Panel    Frequency of Communication with Friends and Family: Three times a week    Frequency of Social Gatherings with Friends and Family: Once a week    Attends Religious Services: More than 4 times per year    Active Member of [employer] or Organizations: No    Attends Banker Meetings: Never    Marital Status: Widowed  Intimate Partner Violence: Not At Risk (04/28/2024)   Humiliation, Afraid, Rape, and Kick questionnaire    Fear of Current or Ex-Partner: No    Emotionally Abused: No    Physically Abused: No    Sexually Abused: No   Family History  Problem Relation Age of Onset   Diabetes Mother    Seizures Mother    CVA Father    Dementia Brother    Cancer Brother    Diabetes Maternal Grandfather    Mental illness Sister    Breast cancer Neg Hx       VITAL SIGNS BP (!) 144/60   Pulse 78   Temp 97.6 F (36.4 C)   Resp 20   Ht 5' 8 (1.727 m)   Wt 144 lb 12.8 oz (65.7 kg)   SpO2 97%   BMI 22.02 kg/m   Outpatient Encounter Medications as of 05/15/2024  Medication Sig   acetaminophen  (TYLENOL ) 500 MG tablet Take 500 mg by mouth in the morning and at bedtime.   aspirin EC 325 MG tablet Take 1 tablet (325 mg total) by mouth daily.   cyanocobalamin 1000 MCG tablet Take 1,000 mcg by mouth daily.   ferrous sulfate  325 (65 FE) MG tablet Take 1 tablet (325 mg total) by mouth every other day.   HYDROcodone-acetaminophen  (NORCO/VICODIN) 5-325 MG tablet Take 1 tablet by mouth every 6 (six) hours as needed for moderate pain (pain score 4-6) or severe pain (pain score 7-10).   lisinopril  (ZESTRIL ) 20 MG tablet Take 1 tablet (20 mg total) by mouth daily.   simvastatin  (ZOCOR ) 20 MG tablet Take 1 tablet (20 mg total) by mouth at bedtime.   No facility-administered encounter medications on file as of 05/15/2024.      SIGNIFICANT DIAGNOSTIC EXAMS  LABS  04-28-24: wbc 12.8; hgb 12.2; hct 37.1; mcv 89.0 pt 350; glucose 101; bun 20; creat 1.02; k+ 4.2; na++ 139; ca 10.1; gfr 55; tsh 3.250 05-01-24: wbc 13.0; hgb 10.2; hct 32.0; mcv 90.7 plt 330   Review of Systems  Constitutional:  Negative for malaise/fatigue.  Respiratory:  Negative for cough and shortness of breath.   Cardiovascular:  Negative for chest pain, palpitations and leg swelling.  Gastrointestinal:  Negative for abdominal pain, constipation and heartburn.  Musculoskeletal:  Negative for back pain, joint pain and myalgias.  Skin: Negative.   Neurological:  Negative for dizziness.  Psychiatric/Behavioral:  The patient is not nervous/anxious.     Physical Exam Constitutional:      General: She is not in acute distress.    Appearance: She is well-developed. She is not diaphoretic.  Neck:     Thyroid : No thyromegaly.  Cardiovascular:     Rate and Rhythm: Normal rate and regular rhythm.     Heart sounds: Normal heart sounds.  Pulmonary:     Effort: Pulmonary effort is normal. No respiratory distress.     Breath sounds: Normal breath sounds.  Abdominal:     General: Bowel sounds are normal. There is no distension.     Palpations: Abdomen is soft.     Tenderness: There is no abdominal tenderness.  Musculoskeletal:     Cervical back: Neck supple.     Right lower leg: No edema.     Left lower leg: No edema.     Comments:  Status post 04-29-24: bipolar right hip replacement   Lymphadenopathy:     Cervical: No cervical adenopathy.  Skin:    General: Skin is warm and dry.  Neurological:     Mental Status: She is alert and oriented to person, place, and time.  Psychiatric:        Mood and Affect: Mood normal.     ASSESSMENT/ PLAN:  TODAY  Neurocognitive deficits  Closed displaced fracture of right femoral neck Essential hypertension  Will continue current medications Will continue therapy as directed The goal of care  is to return home with assist.    Patient is being discharged with the following home health services:  pt/ot to evaluate and treat as indicated for gait balance strength adl training.   Patient is being discharged with the following durable medical equipment:  none needed   Patient has been advised to f/u with their PCP in 1-2 weeks to for a transitions of care visit.  Social services at their facility was responsible for arranging this appointment.  Pt was provided with adequate prescriptions of noncontrolled medications to reach the scheduled appointment .  For controlled substances, a limited supply was provided as appropriate for the individual patient.  If the pt normally receives these medications from a pain clinic or has a contract with another physician, these medications should be received from that clinic or physician only).    A 30 day supply of her prescription medications have been sent to: [facility].    Time spent with patient: 40 minutes: medications; dietary; therapy.    Barnie Seip NP [facility]  call [phone]

## 2024-05-18 ENCOUNTER — Telehealth: Payer: Self-pay

## 2024-05-18 NOTE — Transitions of Care (Post Inpatient/ED Visit) (Signed)
   05/18/2024  Name: Stacey Fitzgerald MRN: [id number] DOB: 10-13-1940  Today's TOC FU Call Status: Today's TOC FU Call Status:: Unsuccessful Call (1st Attempt) Unsuccessful Call (1st Attempt) Date: 05/18/24  Attempted to reach the patient regarding the most recent Inpatient/ED visit.  Follow Up Plan: Additional outreach attempts will be made to reach the patient to complete the Transitions of Care (Post Inpatient/ED visit) call.   Signature Julian Lemmings, LPN [facility] Nurse Health Advisor Direct Dial [phone]

## 2024-05-18 NOTE — Transitions of Care (Post Inpatient/ED Visit) (Signed)
   05/18/2024  Name: Stacey Fitzgerald MRN: [id number] DOB: 1941/06/08  Today's TOC FU Call Status: Today's TOC FU Call Status:: Successful TOC FU Call Completed Unsuccessful Call (1st Attempt) Date: 05/18/24 [facility] FU Call Complete Date: 05/18/24 Patient's Name and Date of Birth confirmed.  Transition Care Management Follow-up Telephone Call Date of Discharge: 05/17/24 Discharge Facility: Other ([facility]) Name of Other ([facility]) Discharge Facility: [facility] Type of Discharge: Inpatient Admission Primary Inpatient Discharge Diagnosis:: joint replacement How have you been since you were released from the hospital?: Better Any questions or concerns?: No  Items Reviewed: Did you receive and understand the discharge instructions provided?: Yes Medications obtained,verified, and reconciled?: Yes (Medications Reviewed) Any new allergies since your discharge?: No Dietary orders reviewed?: Yes Do you have support at home?: Yes People in Home [RPT]: other relative(s) Name of Support/Comfort Primary Source: caregiver  Medications Reviewed Today: Medications Reviewed Today     Reviewed by Emmitt Pan, LPN (Licensed Practical Nurse) on 05/18/24 at 1013  Med List Status: <None>   Medication Order Taking? Sig Documenting Provider Last Dose Status Informant  acetaminophen  (TYLENOL ) 500 MG tablet [id number] Yes Take 500 mg by mouth in the morning and at bedtime. [provider]  Active Child, Pharmacy Records, Self  aspirin EC 325 MG tablet [id number] Yes Take 1 tablet (325 mg total) by mouth daily. Darci Pore, MD  Active   cyanocobalamin 1000 MCG tablet [id number] Yes Take 1,000 mcg by mouth daily. [provider]  Active   ferrous sulfate  325 (65 FE) MG tablet [id number] Yes TAKE 1 TABLET BY MOUTH EVERY OTHER DAY Kandala, Hyndavi, MD  Active   lisinopril  (ZESTRIL ) 20 MG tablet [id number] Yes Take 1 tablet (20 mg total) by mouth daily. Landy Barnie RAMAN, NP   Active   simvastatin  (ZOCOR ) 20 MG tablet [id number] Yes Take 1 tablet (20 mg total) by mouth at bedtime. Landy Barnie RAMAN, NP  Active             Home Care and Equipment/Supplies: Were Home Health Services Ordered?: Yes Name of Home Health Agency:: unknown Has Agency set up a time to come to your home?: Yes First Home Health Visit Date: 05/18/24 Any new equipment or medical supplies ordered?: NA  Functional Questionnaire: Do you need assistance with bathing/showering or dressing?: Yes Do you need assistance with meal preparation?: Yes Do you need assistance with eating?: No Do you have difficulty maintaining continence: No Do you need assistance with getting out of bed/getting out of a chair/moving?: Yes Do you have difficulty managing or taking your medications?: Yes  Follow up appointments reviewed: PCP Follow-up appointment confirmed?: Yes Date of PCP follow-up appointment?: 05/20/24 Follow-up Provider: [facility] Follow-up appointment confirmed?: Yes Date of Specialist follow-up appointment?: 05/20/24 Do you need transportation to your follow-up appointment?: No Do you understand care options if your condition(s) worsen?: Yes-patient verbalized understanding    SIGNATURE Pan Emmitt, LPN [facility] Nurse Health Advisor Direct Dial [phone]

## 2024-05-20 ENCOUNTER — Inpatient Hospital Stay: Admitting: Physician Assistant

## 2024-05-27 ENCOUNTER — Other Ambulatory Visit: Payer: Self-pay

## 2024-05-27 ENCOUNTER — Ambulatory Visit (INDEPENDENT_AMBULATORY_CARE_PROVIDER_SITE_OTHER): Admitting: Orthopedic Surgery

## 2024-05-27 DIAGNOSIS — S72001A Fracture of unspecified part of neck of right femur, initial encounter for closed fracture: Secondary | ICD-10-CM

## 2024-05-27 NOTE — Progress Notes (Signed)
     05/27/2024   Chief Complaint  Patient presents with   Routine Post Op    Right- doing good on walker     Encounter Diagnosis  Name Primary?   Closed displaced fracture of right femoral neck (HCC) 04/29/24 BIPOLAR HIP SURGERY Yes    What pharmacy do you use ? [facility]  DOI/DOS/ Date: 04/29/24  Did you get better, worse or no change (Answer below)   Improved  83 year old female 1 month after right femoral neck fracture treated with bipolar arthroplasty  The patient is doing well  Minimal pain  Improved function  Increase activities as tolerated  Recheck 2 months

## 2024-05-27 NOTE — Progress Notes (Signed)
    05/27/2024   Chief Complaint  Patient presents with   Routine Post Op    Right- doing good on walker     Encounter Diagnosis  Name Primary?   Closed displaced fracture of right femoral neck (HCC) 04/29/24 BIPOLAR HIP SURGERY Yes    What pharmacy do you use ? [facility]  DOI/DOS/ Date: 04/29/24  Did you get better, worse or no change (Answer below)   Improved

## 2024-05-29 ENCOUNTER — Encounter: Payer: Self-pay | Admitting: Physician Assistant

## 2024-05-29 ENCOUNTER — Ambulatory Visit: Admitting: Physician Assistant

## 2024-05-29 VITALS — BP 132/66 | HR 81 | Resp 14 | Ht 68.0 in | Wt 141.2 lb

## 2024-05-29 DIAGNOSIS — Z96641 Presence of right artificial hip joint: Secondary | ICD-10-CM | POA: Diagnosis not present

## 2024-05-29 DIAGNOSIS — Z85038 Personal history of other malignant neoplasm of large intestine: Secondary | ICD-10-CM

## 2024-05-29 DIAGNOSIS — K529 Noninfective gastroenteritis and colitis, unspecified: Secondary | ICD-10-CM

## 2024-05-29 DIAGNOSIS — Z9181 History of falling: Secondary | ICD-10-CM | POA: Diagnosis not present

## 2024-05-29 DIAGNOSIS — I10 Essential (primary) hypertension: Secondary | ICD-10-CM

## 2024-05-29 MED ORDER — SIMVASTATIN 20 MG PO TABS: 20.0000 mg | ORAL_TABLET | Freq: Every day | ORAL | 2 refills | Status: AC

## 2024-05-29 MED ORDER — LISINOPRIL 20 MG PO TABS: 20.0000 mg | ORAL_TABLET | Freq: Every day | ORAL | 2 refills | Status: DC

## 2024-05-29 NOTE — Progress Notes (Signed)
 Established patient visit  Patient: Stacey Fitzgerald   DOB: 1941-02-13   83 y.o. Female  MRN: [id number] Visit Date: 05/29/2024  Today's healthcare provider: Jolynn Spencer, PA-C   Chief Complaint  Patient presents with   Hospitalization Follow-up    Just got home from Rehab. Surgical f/u on Wednesday everything was good.  R hip replaced from a fall 04/27/24   Subjective     Discussed the use of AI scribe software for clinical note transcription with the patient, who gave verbal consent to proceed.  History of Present Illness Stacey Fitzgerald is an 83 year old female who presents with a recent right hip fracture and subsequent hip replacement surgery.  She experienced a fall resulting in a right hip fracture and underwent right hip replacement surgery. Post-surgery, she was discharged to a skilled nursing facility for two weeks and returned home ten days ago. She is currently participating in physical and occupational therapy twice a week, with sessions continuing until January. Her caregivers emphasize the use of a walker to prevent falls. Home safety modifications include a ramp, toilet seat riser, grab bars, removal of area rugs, and a shower seat with a handheld shower.  She frequently experiences diarrhea without blood, and there is no abdominal pain or other bowel movement issues. No swelling is present.  Her medications include lisinopril  and simvastatin . She has a history of cancer, with her last oncology follow-up on October 1st and the next appointment scheduled for January. A surgical follow-up is scheduled for December 31st.       05/29/2024    1:44 PM 04/22/2024    8:15 AM 03/20/2024   10:13 AM  Depression screen PHQ 2/9  Decreased Interest 0 0 0  Down, Depressed, Hopeless 0 0 0  PHQ - 2 Score 0 0 0  Altered sleeping   0  Tired, decreased energy   1  Change in appetite   0  Feeling bad or failure about yourself    0  Trouble concentrating   0  Moving slowly or  fidgety/restless   0  Suicidal thoughts   0  PHQ-9 Score   1      Data saved with a previous flowsheet row definition      03/20/2024   10:13 AM 12/20/2023   10:35 AM 11/08/2023   10:19 AM 10/03/2023    2:02 PM  GAD 7 : Generalized Anxiety Score  Nervous, Anxious, on Edge 0 0 0 0  Control/stop worrying 0 0 0 0  Worry too much - different things 0 0 0 0  Trouble relaxing 0 0 0 0  Restless 0 0 0 0  Easily annoyed or irritable 0 0 0 0  Afraid - awful might happen 0 0 0 0  Total GAD 7 Score 0 0 0 0  Anxiety Difficulty  Not difficult at all Not difficult at all     Medications: Outpatient Medications Prior to Visit  Medication Sig   acetaminophen  (TYLENOL ) 500 MG tablet Take 500 mg by mouth in the morning and at bedtime.   aspirin EC 325 MG tablet Take 1 tablet (325 mg total) by mouth daily.   cyanocobalamin 1000 MCG tablet Take 1,000 mcg by mouth daily.   ferrous sulfate  325 (65 FE) MG tablet TAKE 1 TABLET BY MOUTH EVERY OTHER DAY   lisinopril  (ZESTRIL ) 20 MG tablet Take 1 tablet (20 mg total) by mouth daily.   simvastatin  (ZOCOR ) 20 MG tablet Take 1 tablet (20 mg  total) by mouth at bedtime.   No facility-administered medications prior to visit.    Review of Systems All negative Except see HPI       Objective    BP 132/66   Pulse 81   Resp 14   Ht 5' 8 (1.727 m)   Wt 141 lb 3.2 oz (64 kg)   SpO2 98%   BMI 21.47 kg/m     Physical Exam Vitals reviewed.  Constitutional:      General: She is not in acute distress.    Appearance: Normal appearance. She is well-developed. She is not diaphoretic.  HENT:     Head: Normocephalic and atraumatic.  Eyes:     General: No scleral icterus.    Conjunctiva/sclera: Conjunctivae normal.  Neck:     Thyroid : No thyromegaly.  Cardiovascular:     Rate and Rhythm: Normal rate and regular rhythm.     Pulses: Normal pulses.     Heart sounds: Normal heart sounds. No murmur heard. Pulmonary:     Effort: Pulmonary effort is  normal. No respiratory distress.     Breath sounds: Normal breath sounds. No wheezing, rhonchi or rales.  Musculoskeletal:     Cervical back: Neck supple.     Right lower leg: No edema.     Left lower leg: No edema.  Lymphadenopathy:     Cervical: No cervical adenopathy.  Skin:    General: Skin is warm and dry.     Findings: No rash.  Neurological:     Mental Status: She is alert and oriented to person, place, and time. Mental status is at baseline.  Psychiatric:        Mood and Affect: Mood normal.        Behavior: Behavior normal.      No results found for any visits on 05/29/24.      Assessment & Plan Right hip fracture, post-surgical repair Status post right hip replacement surgery with good recovery. Currently undergoing physical and occupational therapy twice a week. - Continue physical and occupational therapy twice a week until January. - Encouraged use of walker for stability and fall prevention. - Follow up with surgeon on December 31st. Advised to start taking alendronate to reduce further fractures In addition, Calcium  intake should be 1200 mg per day and vit D intake should be 1000IU per day to support bone health and reduce the risk of fractures. Calcium  should be obtained primarily from dietary sources with supplements as needed. Vit D level will be rechecked at the follow-up   Fall risk and home safety management High fall risk due to recent hip fracture and post-surgical status. Home modifications have been made to prevent falls. - Continue using walker for stability. - Maintain home safety modifications. - Consider installing an alarm system for emergencies. - Explore options for home surveillance without internet.  Chronic diarrhea, under evaluation Intermittent diarrhea without blood. No clear etiology identified. Dietary factors may contribute. - Adjust diet to identify potential triggers. - Referred to gastroenterologist for further  evaluation.  Essential hypertension Chronic and stable Hypertension managed with lisinopril  20mg  - Refilled lisinopril  prescription. Continue low sodium diet and daily exercise Will follow-up  History of colon cancer, currently in remission Cancer in remission with regular follow-ups every six months. - Continue regular follow-ups with oncologist every six months. Will follow-up  Essential hypertension (Primary)  - lisinopril  (ZESTRIL ) 20 MG tablet; Take 1 tablet (20 mg total) by mouth daily.  Dispense: 90 tablet; Refill: 2 -  simvastatin  (ZOCOR ) 20 MG tablet; Take 1 tablet (20 mg total) by mouth at bedtime.  Dispense: 90 tablet; Refill: 2 - Comprehensive metabolic panel with GFR - CBC with Differential/Platelet - Lipid Panel With LDL/HDL Ratio  Status post right hip replacement  - alendronate (FOSAMAX) 70 MG tablet; Take 1 tablet (70 mg total) by mouth every 7 (seven) days. Take with a full glass of water  on an empty stomach.  Dispense: 4 tablet; Refill: 11  At high risk for injury related to fall  - alendronate (FOSAMAX) 70 MG tablet; Take 1 tablet (70 mg total) by mouth every 7 (seven) days. Take with a full glass of water  on an empty stomach.  Dispense: 4 tablet; Refill: 11  No orders of the defined types were placed in this encounter.   No follow-ups on file.   The patient was advised to call back or seek an in-person evaluation if the symptoms worsen or if the condition fails to improve as anticipated.  I discussed the assessment and treatment plan with the patient. The patient was provided an opportunity to ask questions and all were answered. The patient agreed with the plan and demonstrated an understanding of the instructions.  I, Shneur Whittenburg, PA-C have reviewed all documentation for this visit. The documentation on 05/29/2024  for the exam, diagnosis, procedures, and orders are all accurate and complete.  Jolynn Spencer, [facility], MMS [facility] [phone] (phone) [phone] (fax)  [facility]

## 2024-05-30 LAB — COMPREHENSIVE METABOLIC PANEL WITH GFR
ALT: 6 IU/L (ref 0–32)
AST: 6 IU/L (ref 0–40)
Albumin: 3.9 g/dL (ref 3.7–4.7)
Alkaline Phosphatase: 99 IU/L (ref 48–129)
BUN/Creatinine Ratio: 13 (ref 12–28)
BUN: 16 mg/dL (ref 8–27)
Bilirubin Total: 0.2 mg/dL (ref 0.0–1.2)
CO2: 22 mmol/L (ref 20–29)
Calcium: 9.5 mg/dL (ref 8.7–10.3)
Chloride: 106 mmol/L (ref 96–106)
Creatinine, Ser: 1.2 mg/dL — ABNORMAL HIGH (ref 0.57–1.00)
Globulin, Total: 2.4 g/dL (ref 1.5–4.5)
Glucose: 102 mg/dL — ABNORMAL HIGH (ref 70–99)
Potassium: 3.7 mmol/L (ref 3.5–5.2)
Sodium: 143 mmol/L (ref 134–144)
Total Protein: 6.3 g/dL (ref 6.0–8.5)
eGFR: 45 mL/min/1.73 — ABNORMAL LOW (ref >59)

## 2024-05-30 LAB — CBC WITH DIFFERENTIAL/PLATELET
Basophils Absolute: 0.1 x10E3/uL (ref 0.0–0.2)
Basos: 1 %
EOS (ABSOLUTE): 0.3 x10E3/uL (ref 0.0–0.4)
Eos: 3 %
Hematocrit: 34.7 % (ref 34.0–46.6)
Hemoglobin: 11.1 g/dL (ref 11.1–15.9)
Immature Grans (Abs): 0 x10E3/uL (ref 0.0–0.1)
Immature Granulocytes: 0 %
Lymphocytes Absolute: 2.2 x10E3/uL (ref 0.7–3.1)
Lymphs: 23 %
MCH: 28.6 pg (ref 26.6–33.0)
MCHC: 32 g/dL (ref 31.5–35.7)
MCV: 89 fL (ref 79–97)
Monocytes Absolute: 0.7 x10E3/uL (ref 0.1–0.9)
Monocytes: 8 %
Neutrophils Absolute: 6.2 x10E3/uL (ref 1.4–7.0)
Neutrophils: 65 %
Platelets: 337 x10E3/uL (ref 150–450)
RBC: 3.88 x10E6/uL (ref 3.77–5.28)
RDW: 13.8 % (ref 11.7–15.4)
WBC: 9.5 x10E3/uL (ref 3.4–10.8)

## 2024-05-30 LAB — LIPID PANEL WITH LDL/HDL RATIO
Cholesterol, Total: 142 mg/dL (ref 100–199)
HDL: 47 mg/dL (ref >39)
LDL Chol Calc (NIH): 68 mg/dL (ref 0–99)
LDL/HDL Ratio: 1.4 ratio (ref 0.0–3.2)
Triglycerides: 159 mg/dL — ABNORMAL HIGH (ref 0–149)
VLDL Cholesterol Cal: 27 mg/dL (ref 5–40)

## 2024-05-31 ENCOUNTER — Ambulatory Visit: Payer: Self-pay | Admitting: Physician Assistant

## 2024-05-31 DIAGNOSIS — Z9181 History of falling: Secondary | ICD-10-CM

## 2024-05-31 DIAGNOSIS — Z539 Procedure and treatment not carried out, unspecified reason: Secondary | ICD-10-CM

## 2024-05-31 DIAGNOSIS — Z96641 Presence of right artificial hip joint: Secondary | ICD-10-CM | POA: Insufficient documentation

## 2024-05-31 DIAGNOSIS — Z85038 Personal history of other malignant neoplasm of large intestine: Secondary | ICD-10-CM | POA: Insufficient documentation

## 2024-05-31 MED ORDER — ALENDRONATE SODIUM 70 MG PO TABS: 70.0000 mg | ORAL_TABLET | ORAL | 11 refills | Status: AC

## 2024-06-10 ENCOUNTER — Ambulatory Visit: Admitting: Orthopedic Surgery

## 2024-06-25 ENCOUNTER — Ambulatory Visit: Admitting: Physician Assistant

## 2024-07-22 ENCOUNTER — Other Ambulatory Visit: Payer: Self-pay | Admitting: *Deleted

## 2024-07-22 ENCOUNTER — Encounter: Payer: Self-pay | Admitting: Orthopedic Surgery

## 2024-07-22 ENCOUNTER — Ambulatory Visit: Admitting: Orthopedic Surgery

## 2024-07-22 DIAGNOSIS — S72001A Fracture of unspecified part of neck of right femur, initial encounter for closed fracture: Secondary | ICD-10-CM

## 2024-07-22 DIAGNOSIS — C182 Malignant neoplasm of ascending colon: Secondary | ICD-10-CM

## 2024-07-22 DIAGNOSIS — D500 Iron deficiency anemia secondary to blood loss (chronic): Secondary | ICD-10-CM

## 2024-07-22 NOTE — Progress Notes (Signed)
" ° ° °  07/22/2024   Chief Complaint  Patient presents with   Post-op Follow-up    Bipolar hip replacement 04/29/24    Encounter Diagnosis  Name Primary?   Closed displaced fracture of right femoral neck (HCC) 04/29/24 BIPOLAR HIP SURGERY Yes    What pharmacy do you use ? _____[address] [address]___  DOI/DOS/ Date:    Did you get better, worse or no change (Answer below)   Improved      "

## 2024-07-22 NOTE — Progress Notes (Signed)
"  ° °  POST OP VISIT   Patient: Stacey Fitzgerald           Date of Birth: Aug 06, 1940           MRN: [id number] Visit Date: 07/22/2024 Requested by: Ostwalt, [name], PA-C [address] [place],  [place] [address] PCP: Dineen Channel, PA-C   Encounter Diagnosis  Name Primary?   Closed displaced fracture of right femoral neck (HCC) 04/29/24 BIPOLAR HIP SURGERY Yes   PROCEDURE: Bipolar hip replacement for femoral neck fracture, DePuy Actis standard press-fit stem  Chief Complaint  Patient presents with   Post-op Follow-up    Bipolar hip replacement 04/29/24    Allergies[58]  83 year old female is now 2-1/2 months after bipolar replacement right hip doing well ambulating well.  Leg lengths very close to equal active range of motion 120 degrees of hip flexion no pain  ASSESSMENT AND PLAN:  Stable doing well after bipolar replacement recheck in 2 months  I have reviewed the patient's history and given the presence of a fragility fracture, I have deemed the necessity of a osteoporosis management referral or confirmed that the patient is currently enrolled in a osteoporosis treatment program.  Per [facility] clinic policy, our goal is ensure optimal postoperative pain control with a multimodal pain management strategy. For all [facility] patients, our goal is to wean post-operative narcotic medications by 6 weeks post-operatively. If this is not possible due to utilization of pain medication prior to surgery, your [facility] doctor will support your acute post-operative pain control for the first 6 weeks postoperatively, with a plan to transition you back to your primary pain team following that. [name] will work to ensure a therapist, occupational.     [1]  Allergies Allergen Reactions   Cephalexin Hives    Patient reports that she is not allergic to this medication but she has not taken it since it was listed as an allergy   "

## 2024-07-24 ENCOUNTER — Inpatient Hospital Stay: Attending: Hematology

## 2024-07-24 DIAGNOSIS — Z9049 Acquired absence of other specified parts of digestive tract: Secondary | ICD-10-CM | POA: Insufficient documentation

## 2024-07-24 DIAGNOSIS — Z79899 Other long term (current) drug therapy: Secondary | ICD-10-CM | POA: Insufficient documentation

## 2024-07-24 DIAGNOSIS — D500 Iron deficiency anemia secondary to blood loss (chronic): Secondary | ICD-10-CM

## 2024-07-24 DIAGNOSIS — R911 Solitary pulmonary nodule: Secondary | ICD-10-CM | POA: Diagnosis not present

## 2024-07-24 DIAGNOSIS — E538 Deficiency of other specified B group vitamins: Secondary | ICD-10-CM | POA: Diagnosis not present

## 2024-07-24 DIAGNOSIS — Z85038 Personal history of other malignant neoplasm of large intestine: Secondary | ICD-10-CM | POA: Insufficient documentation

## 2024-07-24 DIAGNOSIS — D509 Iron deficiency anemia, unspecified: Secondary | ICD-10-CM | POA: Diagnosis not present

## 2024-07-24 DIAGNOSIS — C182 Malignant neoplasm of ascending colon: Secondary | ICD-10-CM

## 2024-07-24 DIAGNOSIS — K769 Liver disease, unspecified: Secondary | ICD-10-CM

## 2024-07-24 LAB — CBC WITH DIFFERENTIAL/PLATELET
Abs Immature Granulocytes: 0.03 K/uL (ref 0.00–0.07)
Basophils Absolute: 0.1 K/uL (ref 0.0–0.1)
Basophils Relative: 1 %
Eosinophils Absolute: 0.2 K/uL (ref 0.0–0.5)
Eosinophils Relative: 2 %
HCT: 39.2 % (ref 36.0–46.0)
Hemoglobin: 12.3 g/dL (ref 12.0–15.0)
Immature Granulocytes: 0 %
Lymphocytes Relative: 23 %
Lymphs Abs: 1.9 K/uL (ref 0.7–4.0)
MCH: 28.9 pg (ref 26.0–34.0)
MCHC: 31.4 g/dL (ref 30.0–36.0)
MCV: 92.2 fL (ref 80.0–100.0)
Monocytes Absolute: 0.6 K/uL (ref 0.1–1.0)
Monocytes Relative: 7 %
Neutro Abs: 5.3 K/uL (ref 1.7–7.7)
Neutrophils Relative %: 67 %
Platelets: 340 K/uL (ref 150–400)
RBC: 4.25 MIL/uL (ref 3.87–5.11)
RDW: 13.2 % (ref 11.5–15.5)
WBC: 8.1 K/uL (ref 4.0–10.5)
nRBC: 0 % (ref 0.0–0.2)

## 2024-07-24 LAB — IRON AND TIBC
Iron: 94 ug/dL (ref 28–170)
Saturation Ratios: 35 % — ABNORMAL HIGH (ref 10.4–31.8)
TIBC: 267 ug/dL (ref 250–450)
UIBC: 173 ug/dL

## 2024-07-24 LAB — COMPREHENSIVE METABOLIC PANEL WITH GFR
ALT: 8 U/L (ref 0–44)
AST: 11 U/L — ABNORMAL LOW (ref 15–41)
Albumin: 4.2 g/dL (ref 3.5–5.0)
Alkaline Phosphatase: 79 U/L (ref 38–126)
Anion gap: 13 (ref 5–15)
BUN: 22 mg/dL (ref 8–23)
CO2: 21 mmol/L — ABNORMAL LOW (ref 22–32)
Calcium: 9.5 mg/dL (ref 8.9–10.3)
Chloride: 108 mmol/L (ref 98–111)
Creatinine, Ser: 1 mg/dL (ref 0.44–1.00)
GFR, Estimated: 56 mL/min — ABNORMAL LOW
Glucose, Bld: 87 mg/dL (ref 70–99)
Potassium: 4.2 mmol/L (ref 3.5–5.1)
Sodium: 142 mmol/L (ref 135–145)
Total Bilirubin: 0.5 mg/dL (ref 0.0–1.2)
Total Protein: 7 g/dL (ref 6.5–8.1)

## 2024-07-24 LAB — VITAMIN B12: Vitamin B-12: 603 pg/mL (ref 180–914)

## 2024-07-24 LAB — FERRITIN: Ferritin: 242 ng/mL (ref 11–307)

## 2024-07-24 LAB — FOLATE: Folate: 6.2 ng/mL

## 2024-07-25 LAB — CEA: CEA: 1.5 ng/mL (ref 0.0–4.7)

## 2024-07-30 LAB — SIGNATERA
SIGNATERA MTM READOUT: 0 MTM/ml
SIGNATERA TEST RESULT: NEGATIVE

## 2024-07-31 ENCOUNTER — Inpatient Hospital Stay: Admitting: Oncology

## 2024-07-31 ENCOUNTER — Encounter: Payer: Self-pay | Admitting: Physician Assistant

## 2024-07-31 VITALS — BP 134/61 | HR 59 | Temp 97.4°F | Resp 16 | Wt 143.9 lb

## 2024-07-31 DIAGNOSIS — E538 Deficiency of other specified B group vitamins: Secondary | ICD-10-CM | POA: Diagnosis not present

## 2024-07-31 DIAGNOSIS — C182 Malignant neoplasm of ascending colon: Secondary | ICD-10-CM

## 2024-07-31 DIAGNOSIS — Z85038 Personal history of other malignant neoplasm of large intestine: Secondary | ICD-10-CM | POA: Diagnosis not present

## 2024-07-31 DIAGNOSIS — I10 Essential (primary) hypertension: Secondary | ICD-10-CM

## 2024-07-31 NOTE — Progress Notes (Signed)
 " Patient Care Team: Ostwalt, [name], PA-C as PCP - General (Physician Assistant) [name], RN as Oncology Nurse Navigator (Medical Oncology)  Clinic Day:  07/31/2024  Referring physician: Dineen Channel, PA-C   CHIEF COMPLAINT:  CC: Stage II (T3 N0 M0) ascending colon adenocarcinoma    ASSESSMENT & PLAN:   Assessment & Plan: Stacey Fitzgerald  is a 84 y.o. female with Stage II (T3 N0 M0) ascending colon adenocarcinoma   Assessment and Plan Assessment & Plan Colon cancer, ascending  Stage II (T3N0M0) ascending colon adenocarcinoma s/p hemicolectomy (10/11/2023).  MSI-high.  Loss of MLH1 and PMS2 BRAF V600 E mutation negative, MLH1-methylation positive.  Sporadic nature. CEA was normal.  Signatera test was negative.   - Patient has no indication for adjuvant chemotherapy - Reviewed negative Signatera ctDNA and normal CEA results with her and family. - Surveillance per NCCN guidelines: - H&E every 3 to 6 months for 2 years, then every 6 months for a total of 5 years - CEA every 3 to 6 months for 2 years, then every 6 months for a total of 5 years - CT CAP every 6 to 12 months from date of surgery for a total of 5 years  -Patient will need a colonoscopy at 1 year mark. Due 09/2024 -Will repeat Signatera every 6 months for 2 years. - Will repeat CT scan in 3 months.   Return to clinic in 3 months with labs and CT scan  Iron  deficiency anemia, status post IV iron , on oral iron  Resolved at this time  Vitamin B12 deficiency Vitamin B12 :603  - Continue vitamin B12 supplementation  Right upper lobe part solid nodule Staging CT chest showed solid nodule in the right upper lobe Repeat CT did not show this   The patient understands the plans discussed today and is in agreement with them.  She knows to contact our office if she develops concerns prior to her next appointment.  17 minutes of total time was spent for this patient encounter, including  preparation,face-to-face counseling with the patient and coordination of care, physical exam, and documentation of the encounter.   [name], MD  [facility]  - A DEPT OF [name] [address] [place] [address] Dept: [phone] Dept Fax: [phone]   Orders Placed This Encounter  Procedures   CT ABDOMEN PELVIS W CONTRAST    Standing Status:   Future    Expected Date:   10/29/2024    Expiration Date:   07/31/2025    If indicated for the ordered procedure, I authorize the administration of contrast media per Radiology protocol:   Yes    Does the patient have a contrast media/X-ray dye allergy?:   No    Preferred imaging location?:   [facility]    If indicated for the ordered procedure, I authorize the administration of oral contrast media per Radiology protocol:   Yes   CBC with Differential/Platelet    Standing Status:   Future    Expected Date:   10/26/2024    Expiration Date:   01/24/2025   Comprehensive metabolic panel with GFR    Standing Status:   Future    Expected Date:   10/26/2024    Expiration Date:   01/24/2025   CEA    Standing Status:   Future    Expected Date:   10/26/2024    Expiration Date:   01/24/2025  ONCOLOGY HISTORY:   Stage II (T3 N0 M0) ascending colon adenocarcinoma:   -Presentation: Severe anemia with hemoglobin 6.4, s/p 2 units PRBC -10/09/2023: Colonoscopy : In the proximal ascending colon, there is a spiral shaped semilunar/semiapple core neoplasm approximately 10 to 15 cm distal to the ileocecal valve.  Diverticulosis in the sigmoid colon.  Nonbleeding internal hemorrhoids.  Biopsy consistent with invasive moderately differentiated adenocarcinoma. -10/10/2023 CTAP : Focal lobular mass along the right side of the colon just above the ileocecal bowel.  Few borderline enlarged lymph nodes are seen in the medially adjacent mesentery in the right lower quadrant.  Tiny segment  8 dome liver lesion identified too small to characterize. -10/11/2023:  Right hemicolectomy by Dr. Kallie - Pathology: Invasive moderately differentiated mucinous adenocarcinoma, invading through muscularis propria, tumor arises within a tubular adenoma with high-grade dysplasia, margins free, 0/38 lymph nodes involved, pT3 pN0.   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 - MSI-high.  BRAF V600 mutation negative.  MLH1 hyper methylation positive.  This indicates sporadic nature. -11/01/2023 CT chest: Part solid nodule within the anterolateral right upper lobe measuring 2 cm with 5 mm solid component. -10/31/2023 MRI liver: Tiny benign nonenhancing fluid signal cyst in the central posterior right lobe of the liver, hepatic segment 7/8. -11/15/2023: Signatera: Negative -03/08/2024: Signatera: Negative -04/15/2024:CT AP: Interval right hemicolectomy and ileocolonic anastomosis. No evidence of local recurrence or metastatic disease.  -04/15/2024: CEA:1.82   Current Treatment:  Surveillance  INTERVAL HISTORY:    Discussed the use of AI scribe software for clinical note transcription with the patient, who gave verbal consent to proceed.  History of Present Illness Stacey Fitzgerald is an 84 year old female with malignant neoplasm of the ascending colon, status post surgical resection, presenting for routine oncology surveillance.  She is undergoing routine post-treatment surveillance for ascending colon cancer. Recent laboratory studies include a negative Signatera circulating tumor DNA assay and normal carcinoembryonic antigen (CEA) levels. She denies hematochezia, unintentional weight loss, or anorexia. She reports no new gastrointestinal or constitutional symptoms. Her last colonoscopy was performed in March 2025.  In October 2025, she sustained a right hip fracture following a fall, managed with right total hip arthroplasty. She completed a two-week inpatient pain management program  postoperatively and currently lives independently, receiving assistance from her sons for healthcare appointments and MyChart management.  She continues vitamin B12 supplementation for deficiency and has discontinued iron  supplementation. She has a uterine prolapse managed with a uterine device, followed annually by her OB GYN, and is currently asymptomatic.   I have reviewed the past medical history, past surgical history, social history and family history with the patient and they are unchanged from previous note.  ALLERGIES:  is allergic to cephalexin.  MEDICATIONS:  Current Outpatient Medications  Medication Sig Dispense Refill   acetaminophen  (TYLENOL ) 500 MG tablet Take 500 mg by mouth in the morning and at bedtime.     alendronate  (FOSAMAX ) 70 MG tablet Take 1 tablet (70 mg total) by mouth every 7 (seven) days. Take with a full glass of water  on an empty stomach. 4 tablet 11   cyanocobalamin  1000 MCG tablet Take 1,000 mcg by mouth daily.     ferrous sulfate  325 (65 FE) MG tablet TAKE 1 TABLET BY MOUTH EVERY OTHER DAY 60 tablet 3   lisinopril  (ZESTRIL ) 20 MG tablet Take 1 tablet (20 mg total) by mouth daily. 90 tablet 2   simvastatin  (ZOCOR ) 20 MG tablet Take 1 tablet (20 mg total) by mouth  at bedtime. 90 tablet 2   No current facility-administered medications for this visit.   VITALS:  Blood pressure 134/61, pulse (!) 59, temperature (!) 97.4 F (36.3 C), temperature source Oral, resp. rate 16, weight 143 lb 14.4 oz (65.3 kg), SpO2 97%.  Wt Readings from Last 3 Encounters:  07/31/24 143 lb 14.4 oz (65.3 kg)  05/29/24 141 lb 3.2 oz (64 kg)  05/15/24 144 lb 12.8 oz (65.7 kg)    Body mass index is 21.88 kg/m.  Performance status (ECOG): 2 - Symptomatic, <50% confined to bed  PHYSICAL EXAM:   GENERAL:alert, no distress and comfortable SKIN: skin color, texture, turgor are normal, no rashes or significant lesions LUNGS: clear to auscultation and percussion with normal  breathing effort HEART: regular rate & rhythm and no murmurs and no lower extremity edema ABDOMEN:abdomen soft, non-tender and normal bowel sounds Musculoskeletal:no cyanosis of digits and no clubbing  NEURO: alert & oriented x 3 with fluent speech, no focal motor/sensory deficits  LABORATORY DATA:  I have reviewed the data as listed     Component Value Date/Time   NA 142 07/24/2024 0838   NA 143 05/29/2024 1426   K 4.2 07/24/2024 0838   CL 108 07/24/2024 0838   CO2 21 (L) 07/24/2024 0838   GLUCOSE 87 07/24/2024 0838   BUN 22 07/24/2024 0838   BUN 16 05/29/2024 1426   CREATININE 1.00 07/24/2024 0838   CREATININE 0.87 05/31/2017 0814   CALCIUM  9.5 07/24/2024 0838   PROT 7.0 07/24/2024 0838   PROT 6.3 05/29/2024 1426   ALBUMIN 4.2 07/24/2024 0838   ALBUMIN 3.9 05/29/2024 1426   AST 11 (L) 07/24/2024 0838   ALT 8 07/24/2024 0838   ALKPHOS 79 07/24/2024 0838   BILITOT 0.5 07/24/2024 0838   BILITOT 0.2 05/29/2024 1426   GFRNONAA 56 (L) 07/24/2024 0838   GFRNONAA 65 05/31/2017 0814   GFRAA 53 (L) 05/31/2020 0937   GFRAA 76 05/31/2017 0814     Lab Results  Component Value Date   WBC 8.1 07/24/2024   NEUTROABS 5.3 07/24/2024   HGB 12.3 07/24/2024   HCT 39.2 07/24/2024   MCV 92.2 07/24/2024   PLT 340 07/24/2024      Chemistry      Component Value Date/Time   NA 142 07/24/2024 0838   NA 143 05/29/2024 1426   K 4.2 07/24/2024 0838   CL 108 07/24/2024 0838   CO2 21 (L) 07/24/2024 0838   BUN 22 07/24/2024 0838   BUN 16 05/29/2024 1426   CREATININE 1.00 07/24/2024 0838   CREATININE 0.87 05/31/2017 0814      Component Value Date/Time   CALCIUM  9.5 07/24/2024 0838   ALKPHOS 79 07/24/2024 0838   AST 11 (L) 07/24/2024 0838   ALT 8 07/24/2024 0838   BILITOT 0.5 07/24/2024 0838   BILITOT 0.2 05/29/2024 1426        Latest Reference Range & Units 07/24/24 08:38  Iron  28 - 170 ug/dL 94  UIBC ug/dL 826  TIBC 749 - 549 ug/dL 732  Saturation Ratios 10.4 - 31.8 % 35  (H)  Ferritin 11 - 307 ng/mL 242  Folate >5.9 ng/mL 6.2  Vitamin B12 180 - 914 pg/mL 603  (H): Data is abnormally high   Latest Reference Range & Units 07/24/24 08:38  CEA 0.0 - 4.7 ng/mL 1.5   RADIOGRAPHIC STUDIES: I have personally reviewed the radiological images as listed and agreed with the findings in the report.  "

## 2024-08-03 MED ORDER — LISINOPRIL 20 MG PO TABS: 20.0000 mg | ORAL_TABLET | Freq: Every day | ORAL | 0 refills | Status: DC

## 2024-08-04 ENCOUNTER — Other Ambulatory Visit: Payer: Self-pay

## 2024-08-04 DIAGNOSIS — I10 Essential (primary) hypertension: Secondary | ICD-10-CM

## 2024-08-04 MED ORDER — LISINOPRIL 20 MG PO TABS: 20.0000 mg | ORAL_TABLET | Freq: Every day | ORAL | 0 refills | Status: AC

## 2024-08-07 ENCOUNTER — Ambulatory Visit: Admitting: Physician Assistant

## 2024-09-18 ENCOUNTER — Ambulatory Visit: Admitting: Orthopedic Surgery

## 2024-10-23 ENCOUNTER — Other Ambulatory Visit (HOSPITAL_COMMUNITY)

## 2024-10-23 ENCOUNTER — Inpatient Hospital Stay

## 2024-10-30 ENCOUNTER — Inpatient Hospital Stay: Admitting: Oncology

## 2024-12-01 ENCOUNTER — Ambulatory Visit
